# Patient Record
Sex: Female | Born: 1960
Health system: Southern US, Community
[De-identification: ages and names within clinical notes are randomized; demographics above are authoritative.]

## PROBLEM LIST (undated history)

## (undated) DIAGNOSIS — F329 Major depressive disorder, single episode, unspecified: Secondary | ICD-10-CM

## (undated) DIAGNOSIS — K648 Other hemorrhoids: Secondary | ICD-10-CM

## (undated) DIAGNOSIS — I700 Atherosclerosis of aorta: Secondary | ICD-10-CM

## (undated) DIAGNOSIS — K5790 Diverticulosis of intestine, part unspecified, without perforation or abscess without bleeding: Secondary | ICD-10-CM

## (undated) DIAGNOSIS — M1990 Unspecified osteoarthritis, unspecified site: Secondary | ICD-10-CM

## (undated) DIAGNOSIS — N200 Calculus of kidney: Secondary | ICD-10-CM

## (undated) DIAGNOSIS — E0500 Thyrotoxicosis with diffuse goiter without thyrotoxic crisis or storm: Secondary | ICD-10-CM

## (undated) DIAGNOSIS — K760 Fatty (change of) liver, not elsewhere classified: Secondary | ICD-10-CM

## (undated) DIAGNOSIS — Z8669 Personal history of other diseases of the nervous system and sense organs: Secondary | ICD-10-CM

## (undated) DIAGNOSIS — E119 Type 2 diabetes mellitus without complications: Secondary | ICD-10-CM

## (undated) DIAGNOSIS — D126 Benign neoplasm of colon, unspecified: Secondary | ICD-10-CM

## (undated) DIAGNOSIS — F419 Anxiety disorder, unspecified: Secondary | ICD-10-CM

## (undated) DIAGNOSIS — H9190 Unspecified hearing loss, unspecified ear: Secondary | ICD-10-CM

## (undated) DIAGNOSIS — M8580 Other specified disorders of bone density and structure, unspecified site: Secondary | ICD-10-CM

## (undated) DIAGNOSIS — D649 Anemia, unspecified: Secondary | ICD-10-CM

## (undated) DIAGNOSIS — E781 Pure hyperglyceridemia: Secondary | ICD-10-CM

## (undated) DIAGNOSIS — K219 Gastro-esophageal reflux disease without esophagitis: Secondary | ICD-10-CM

## (undated) DIAGNOSIS — M810 Age-related osteoporosis without current pathological fracture: Secondary | ICD-10-CM

## (undated) DIAGNOSIS — E785 Hyperlipidemia, unspecified: Secondary | ICD-10-CM

## (undated) DIAGNOSIS — Z8619 Personal history of other infectious and parasitic diseases: Secondary | ICD-10-CM

## (undated) DIAGNOSIS — T7840XA Allergy, unspecified, initial encounter: Secondary | ICD-10-CM

## (undated) DIAGNOSIS — F32A Depression, unspecified: Secondary | ICD-10-CM

## (undated) HISTORY — DX: Benign neoplasm of colon, unspecified: D12.6

## (undated) HISTORY — PX: NASAL SINUS SURGERY: SHX719

## (undated) HISTORY — DX: Diverticulosis of intestine, part unspecified, without perforation or abscess without bleeding: K57.90

## (undated) HISTORY — DX: Unspecified osteoarthritis, unspecified site: M19.90

## (undated) HISTORY — DX: Major depressive disorder, single episode, unspecified: F32.9

## (undated) HISTORY — DX: Unspecified hearing loss, unspecified ear: H91.90

## (undated) HISTORY — DX: Anxiety disorder, unspecified: F41.9

## (undated) HISTORY — DX: Allergy, unspecified, initial encounter: T78.40XA

## (undated) HISTORY — DX: Age-related osteoporosis without current pathological fracture: M81.0

## (undated) HISTORY — DX: Personal history of other infectious and parasitic diseases: Z86.19

## (undated) HISTORY — DX: Personal history of other diseases of the nervous system and sense organs: Z86.69

## (undated) HISTORY — DX: Depression, unspecified: F32.A

## (undated) HISTORY — DX: Hyperlipidemia, unspecified: E78.5

## (undated) HISTORY — PX: COLONOSCOPY: SHX174

## (undated) HISTORY — DX: Atherosclerosis of aorta: I70.0

## (undated) HISTORY — DX: Pure hyperglyceridemia: E78.1

## (undated) HISTORY — DX: Anemia, unspecified: D64.9

## (undated) HISTORY — DX: Fatty (change of) liver, not elsewhere classified: K76.0

## (undated) HISTORY — DX: Other hemorrhoids: K64.8

## (undated) HISTORY — DX: Type 2 diabetes mellitus without complications: E11.9

## (undated) HISTORY — DX: Thyrotoxicosis with diffuse goiter without thyrotoxic crisis or storm: E05.00

## (undated) HISTORY — DX: Other specified disorders of bone density and structure, unspecified site: M85.80

## (undated) HISTORY — PX: POLYPECTOMY: SHX149

## (undated) HISTORY — PX: OTHER SURGICAL HISTORY: SHX169

## (undated) HISTORY — DX: Calculus of kidney: N20.0

## (undated) HISTORY — DX: Gastro-esophageal reflux disease without esophagitis: K21.9

---

## 1995-01-16 DIAGNOSIS — E0500 Thyrotoxicosis with diffuse goiter without thyrotoxic crisis or storm: Secondary | ICD-10-CM

## 1995-01-16 HISTORY — PX: THYROIDECTOMY: SHX17

## 1995-01-16 HISTORY — DX: Thyrotoxicosis with diffuse goiter without thyrotoxic crisis or storm: E05.00

## 1999-01-16 HISTORY — PX: APPENDECTOMY: SHX54

## 1999-01-16 HISTORY — PX: OOPHORECTOMY: SHX86

## 2001-01-15 HISTORY — PX: ENDOMETRIAL ABLATION: SHX621

## 2007-01-16 HISTORY — PX: HIP FRACTURE SURGERY: SHX118

## 2009-01-15 HISTORY — PX: OTHER SURGICAL HISTORY: SHX169

## 2012-01-16 LAB — HM PAP SMEAR: HM Pap smear: NORMAL

## 2012-12-01 ENCOUNTER — Encounter: Payer: Self-pay | Admitting: Family

## 2012-12-01 ENCOUNTER — Ambulatory Visit (INDEPENDENT_AMBULATORY_CARE_PROVIDER_SITE_OTHER): Payer: BC Managed Care – PPO | Admitting: Family

## 2012-12-01 VITALS — BP 128/90 | HR 68 | Temp 98.2°F | Resp 16 | Ht 64.0 in | Wt 140.0 lb

## 2012-12-01 DIAGNOSIS — Z23 Encounter for immunization: Secondary | ICD-10-CM

## 2012-12-01 DIAGNOSIS — F419 Anxiety disorder, unspecified: Secondary | ICD-10-CM | POA: Insufficient documentation

## 2012-12-01 DIAGNOSIS — F341 Dysthymic disorder: Secondary | ICD-10-CM

## 2012-12-01 DIAGNOSIS — R739 Hyperglycemia, unspecified: Secondary | ICD-10-CM

## 2012-12-01 DIAGNOSIS — E039 Hypothyroidism, unspecified: Secondary | ICD-10-CM

## 2012-12-01 DIAGNOSIS — N200 Calculus of kidney: Secondary | ICD-10-CM

## 2012-12-01 DIAGNOSIS — F329 Major depressive disorder, single episode, unspecified: Secondary | ICD-10-CM

## 2012-12-01 LAB — BASIC METABOLIC PANEL
BUN: 15 mg/dL (ref 6–23)
CO2: 27 mEq/L (ref 19–32)
Chloride: 103 mEq/L (ref 96–112)
Creat: 0.76 mg/dL (ref 0.50–1.10)
Glucose, Bld: 114 mg/dL — ABNORMAL HIGH (ref 70–99)
Potassium: 5.1 mEq/L (ref 3.5–5.3)
Sodium: 141 mEq/L (ref 135–145)

## 2012-12-01 NOTE — Progress Notes (Signed)
Subjective:    Patient ID: Donna Anderson, female    DOB: [birth year], 52 y.o.   MRN: [id number]  HPI  Ms. Danese is a 52 yr old female who presents today to establish care.  Nephrolithiasis-  Passed kidney stone this passed weekend. Reports that she has kidney stones about once a year starting around age 15.  Reports that she had severe pain, passed stone (2-3 mm) bring stone to apt.  Denies dysuria, frequency at this time.    Anxiety-  Reports that it has only been for the past few years. Had first anxiety attack 3 years ago.  Reports that she had one panic attack here.  Reports that she uses lorazepam about once a month.  She reports feeling kind of depressed since moving here from [place].  Still looking for work. Has worked in Set designer as a Engineer, production.  Moved for her husband's work.  Has used zoloft and paxil in the past for depression and reports that she had worsening depression.  Denies SI/HI.    Hypothyroid- Reports that she had graves disease s/p thyroidectomy in 1997.  Has been on levothyroxine ever since.      Review of Systems  Constitutional: Negative for unexpected weight change.  HENT:       Reports hearing aids  Eyes: Negative for visual disturbance.  Respiratory: Negative for cough and shortness of breath.   Cardiovascular: Negative for chest pain.  Gastrointestinal: Negative for nausea and constipation.  Genitourinary: Negative for dysuria.  Musculoskeletal: Negative for myalgias.       Chronic right hip pain  Skin: Negative for rash.  Neurological:       Reports Ha's 2x a week  Hematological: Negative for adenopathy.  Psychiatric/Behavioral:       See HPI   Past Medical History  Diagnosis Date  . Arthritis   . History of chicken pox   . Depression   . Diverticulitis   . Hyperlipidemia   . Kidney stones   . History of migraine   . Graves disease 1997  . Hearing loss     History   Social History  . Marital Status: Married    Spouse Name: N/A     Number of Children: N/A  . Years of Education: N/A   Occupational History  . Not on file.   Social History Main Topics  . Smoking status: Former Smoker -- 15 years    Quit date: 01/16/1999  . Smokeless tobacco: Never Used  . Alcohol Use: Yes     Comment: occasional  . Drug Use: Not on file  . Sexual Activity: Not on file   Other Topics Concern  . Not on file   Social History Narrative   Married (second marriage) has a 32 yr old son in [place]   Worked in Set designer   Enjoys exercising, hiking, kayaking.    Completed 12th grade   1 dog, 1 cat          Past Surgical History  Procedure Laterality Date  . Appendectomy  2001  . Oophorectomy Right 2001    due to large ovarian cyst  . Hip fracture surgery Right 2009    fell playing with dog  . Labrum repair Right 2011  . Thyroidectomy  1997  . Endometrial ablation  2003    Family History  Problem Relation Age of Onset  . Hyperlipidemia Father   . Hypertension Father   . Heart failure Father 16  . Diabetes Sister   .  Arthritis Paternal Grandmother   . Kidney disease Paternal Grandmother   . Diabetes Paternal Grandmother   . Arthritis Paternal Grandfather     No Known Allergies  No current outpatient prescriptions on file prior to visit.   No current facility-administered medications on file prior to visit.    BP 128/90  Pulse 68  Temp(Src) 98.2 F (36.8 C) (Oral)  Resp 16  Ht 5\' 4"  (1.626 m)  Wt 140 lb 0.6 oz (63.522 kg)  BMI 24.03 kg/m2  SpO2 99%        Objective:   Physical Exam  Constitutional: She is oriented to person, place, and time. She appears well-developed and well-nourished. No distress.  HENT:  Head: Normocephalic and atraumatic.  Cardiovascular: Normal rate and regular rhythm.   No murmur heard. Pulmonary/Chest: Effort normal and breath sounds normal. No respiratory distress. She has no wheezes. She has no rales. She exhibits no tenderness.  Musculoskeletal: She exhibits no  edema.  Lymphadenopathy:    She has no cervical adenopathy.  Neurological: She is alert and oriented to person, place, and time.  Psychiatric: She has a normal mood and affect. Her behavior is normal. Judgment and thought content normal.          Assessment & Plan:

## 2012-12-01 NOTE — Assessment & Plan Note (Signed)
Passed stone which she brings in with her today.  Obtain bmet to assess renal function. Clinically resolved.  Monitor.

## 2012-12-01 NOTE — Patient Instructions (Signed)
Please complete lab work prior to leaving. Schedule a fasting physical some time in the next 3 months.

## 2012-12-01 NOTE — Assessment & Plan Note (Signed)
Obtain TSH. Continue synthroid. Clinically stable.

## 2012-12-01 NOTE — Assessment & Plan Note (Signed)
slightlty deteriorated from situational stress.  She does not wish to pursue additional medication at this time.  Monitor.

## 2012-12-03 ENCOUNTER — Encounter: Payer: Self-pay | Admitting: Family

## 2012-12-03 MED ORDER — LEVOTHYROXINE SODIUM 112 MCG PO TABS: 112.0000 ug | ORAL_TABLET | Freq: Every day | ORAL | Status: DC

## 2012-12-03 NOTE — Addendum Note (Signed)
Addended by: Sandford Craze on: 12/03/2012 02:50 PM   Modules accepted: Orders

## 2012-12-04 ENCOUNTER — Encounter: Payer: Self-pay | Admitting: Family

## 2012-12-04 MED ORDER — CITALOPRAM HYDROBROMIDE 20 MG PO TABS: ORAL_TABLET | ORAL | Status: DC

## 2012-12-04 NOTE — Assessment & Plan Note (Signed)
Glucose mildly elevated. Plan A1C next visit.

## 2012-12-14 ENCOUNTER — Encounter: Payer: Self-pay | Admitting: Family

## 2012-12-15 ENCOUNTER — Telehealth: Payer: Self-pay | Admitting: Family

## 2012-12-15 MED ORDER — ESCITALOPRAM OXALATE 10 MG PO TABS: 10.0000 mg | ORAL_TABLET | Freq: Every day | ORAL | Status: DC

## 2012-12-15 NOTE — Telephone Encounter (Signed)
Reviewed Mychart message. Reports that since she increased the citalopram to the full tab she has had increase in panic attacks.  Has lorazapam at home.  Advised pt to stop citalopram 20, start lexapro 10, use lorazepam PRN panic attack during the day and PRN HS for sleep.  Follow up in 2-3 weeks.  Call if further questions/concerns. Pt verbalizes understanding.

## 2012-12-15 NOTE — Telephone Encounter (Signed)
Received medical records from [facility] ° °P: [phone] °F: [phone] °

## 2012-12-23 ENCOUNTER — Encounter: Payer: Self-pay | Admitting: Family

## 2012-12-23 ENCOUNTER — Telehealth: Payer: Self-pay | Admitting: Family

## 2012-12-23 NOTE — Telephone Encounter (Signed)
Phoned pt. Advised her to try lorazepam HS to see if this helps with sleep. Continue lexapro 10, call if no improvement in sleep with HS lorazepam. Pt verbalizes understanding.

## 2013-01-05 ENCOUNTER — Encounter: Payer: Self-pay | Admitting: Family

## 2013-01-06 NOTE — Telephone Encounter (Signed)
We generally see pts back in 1 month.  OK to send 1 month supply of lexapro and 30 tabs alprazolam but will need OV prior to additional refills.

## 2013-01-06 NOTE — Telephone Encounter (Signed)
Pharmacy line busy x 2.

## 2013-01-07 MED ORDER — ESCITALOPRAM OXALATE 10 MG PO TABS: 10.0000 mg | ORAL_TABLET | Freq: Every day | ORAL | Status: DC

## 2013-01-07 MED ORDER — LORAZEPAM 0.5 MG PO TABS: 0.5000 mg | ORAL_TABLET | Freq: Three times a day (TID) | ORAL | Status: DC | PRN

## 2013-01-07 NOTE — Telephone Encounter (Signed)
Refills called to pharmacy voicemail.

## 2013-01-15 DIAGNOSIS — K5790 Diverticulosis of intestine, part unspecified, without perforation or abscess without bleeding: Secondary | ICD-10-CM

## 2013-01-15 HISTORY — DX: Diverticulosis of intestine, part unspecified, without perforation or abscess without bleeding: K57.90

## 2013-02-04 ENCOUNTER — Encounter: Payer: Self-pay | Admitting: Family

## 2013-02-04 NOTE — Telephone Encounter (Signed)
Please schedule follow up appt. 

## 2013-02-09 ENCOUNTER — Encounter: Payer: Self-pay | Admitting: Family

## 2013-02-09 ENCOUNTER — Ambulatory Visit (INDEPENDENT_AMBULATORY_CARE_PROVIDER_SITE_OTHER): Payer: BC Managed Care – PPO | Admitting: Family

## 2013-02-09 VITALS — BP 110/84 | HR 70 | Temp 98.7°F | Resp 14 | Ht 64.0 in | Wt 138.0 lb

## 2013-02-09 DIAGNOSIS — M25441 Effusion, right hand: Secondary | ICD-10-CM

## 2013-02-09 DIAGNOSIS — M7989 Other specified soft tissue disorders: Secondary | ICD-10-CM

## 2013-02-09 DIAGNOSIS — J309 Allergic rhinitis, unspecified: Secondary | ICD-10-CM

## 2013-02-09 MED ORDER — LEVOTHYROXINE SODIUM 112 MCG PO TABS: 112.0000 ug | ORAL_TABLET | Freq: Every day | ORAL | Status: DC

## 2013-02-09 MED ORDER — FLUTICASONE PROPIONATE 50 MCG/ACT NA SUSP: 2.0000 | Freq: Every day | NASAL | Status: DC

## 2013-02-09 MED ORDER — ESCITALOPRAM OXALATE 10 MG PO TABS: 10.0000 mg | ORAL_TABLET | Freq: Every day | ORAL | Status: DC

## 2013-02-09 NOTE — Assessment & Plan Note (Signed)
Very mild- likely OA.  Monitor for now.  If symptoms worsen, could consider autoimmune work up.

## 2013-02-09 NOTE — Assessment & Plan Note (Signed)
Trial of flonase. Pt advised to call if symptoms worsen or if symptoms do not improve.

## 2013-02-09 NOTE — Progress Notes (Signed)
Pre visit review using our clinic review tool, if applicable. No additional management support is needed unless otherwise documented below in the visit note. 

## 2013-02-09 NOTE — Progress Notes (Signed)
Subjective:    Patient ID: Donna Anderson, female    DOB: [birth year], 53 y.o.   MRN: [id number]  Hand Pain  Pertinent negatives include no chest pain.   Ms. [name] is a 53 year old female who presents today for follow up.  Depression/Anxiety) Patient reports she's feeling much better. Patient reports she's starting a new job next week and would like to start weaning off of the Lexapro. Patient reports Ativan has helped with the night time panic attacks and takes every night at bedtime.    Allergies) Patient reports sinus headaches and nasal congestion to the left frontal side which has prevented patient from sleeping. Patient reports taking Claritin OTC without relief.   Finger pain) Patient reports pain, stiffness,decreased ROM, and swelling to the right index distal interphalangeal joint x 3 weeks. Denies recent injury. Patient also reporting "bony growth" to right lateral wrist x 3 weeks as well, denies pain and inflammation   Review of Systems  HENT: Positive for congestion, rhinorrhea and sinus pressure. Negative for ear pain.   Eyes: Positive for itching.  Respiratory: Negative for cough and shortness of breath.   Cardiovascular: Negative for chest pain.  Gastrointestinal: Negative for nausea and vomiting.  Musculoskeletal: Positive for arthralgias and joint swelling.  Neurological: Positive for headaches.       Reports daily headaches to left frontal area. Believes this is attributed to chronic sinusitis.  Psychiatric/Behavioral: Negative for suicidal ideas and agitation. The patient is not nervous/anxious.    Past Medical History  Diagnosis Date  . Arthritis   . History of chicken pox   . Depression   . Diverticulitis   . Hyperlipidemia   . Kidney stones   . History of migraine   . Graves disease 1997  . Hearing loss     History   Social History  . Marital Status: Married    Spouse Name: N/A    Number of Children: N/A  . Years of Education: N/A    Occupational History  . Not on file.   Social History Main Topics  . Smoking status: Former Smoker -- 15 years    Quit date: 01/16/1999  . Smokeless tobacco: Never Used  . Alcohol Use: Yes     Comment: occasional  . Drug Use: Not on file  . Sexual Activity: Not on file   Other Topics Concern  . Not on file   Social History Narrative   Married (second marriage) has a 36 yr old son in [place]   Worked in Psychologist, educational   Enjoys exercising, hiking, kayaking.    Completed 12th grade   1 dog, 1 cat          Past Surgical History  Procedure Laterality Date  . Appendectomy  2001  . Oophorectomy Right 2001    due to large ovarian cyst  . Hip fracture surgery Right 2009    fell playing with dog  . Labrum repair Right 2011  . Thyroidectomy  1997  . Endometrial ablation  2003    Family History  Problem Relation Age of Onset  . Hyperlipidemia Father   . Hypertension Father   . Heart failure Father 79  . Diabetes Sister   . Arthritis Paternal Grandmother   . Kidney disease Paternal Grandmother   . Diabetes Paternal Grandmother   . Arthritis Paternal Grandfather     No Known Allergies  Current Outpatient Prescriptions on File Prior to Visit  Medication Sig Dispense Refill  . LORazepam (ATIVAN)  0.5 MG tablet Take 1 tablet (0.5 mg total) by mouth 3 (three) times daily as needed for anxiety.  30 tablet  0   No current facility-administered medications on file prior to visit.    BP 110/84  Pulse 70  Temp(Src) 98.7 F (37.1 C) (Oral)  Resp 14  Ht 5\' 4"  (1.626 m)  Wt 138 lb 0.6 oz (62.615 kg)  BMI 23.68 kg/m2  SpO2 98%       Objective:   Physical Exam  Constitutional: She is oriented to person, place, and time. She appears well-nourished.  HENT:  Head: Normocephalic.  Right Ear: External ear normal.  Left Ear: External ear normal.  Mouth/Throat: Oropharynx is clear and moist.  Eyes: Pupils are equal, round, and reactive to light.  Neck: Neck supple.   Cardiovascular: Normal rate and regular rhythm.   Pulmonary/Chest: Breath sounds normal. No respiratory distress. She has no wheezes.  Musculoskeletal:  Very mild swelling of the right DIP  Lymphadenopathy:    She has no cervical adenopathy.  Neurological: She is alert and oriented to person, place, and time.  Skin: Skin is warm and dry.  Linear ridge noted right index finger nail.     Psychiatric: She has a normal mood and affect. Her behavior is normal.          Assessment & Plan:  I have personally seen and examined patient and agree with Alma Friendly NP student's assessment and plan.

## 2013-02-09 NOTE — Patient Instructions (Signed)
Continue Lexapro.  You may taper down and discontinue Lorazepam as tolerated. Follow up in 3 months.

## 2013-02-09 NOTE — Assessment & Plan Note (Signed)
Continue Lexapro. May discontinue Lorazepam for anxiety as tolerated. Follow up in three months.

## 2013-02-10 ENCOUNTER — Other Ambulatory Visit: Payer: Self-pay | Admitting: Family

## 2013-02-10 ENCOUNTER — Encounter: Payer: Self-pay | Admitting: Family

## 2013-02-10 NOTE — Telephone Encounter (Signed)
Last lorazepam refill 01/07/13, #30.  Please advise.

## 2013-02-10 NOTE — Telephone Encounter (Signed)
OK to send 30 tabs zero refills.  

## 2013-02-11 ENCOUNTER — Other Ambulatory Visit: Payer: Self-pay | Admitting: Family

## 2013-02-11 NOTE — Telephone Encounter (Signed)
Rx called to pharmacy voicemail as below. Sent mychart notification to pt.

## 2013-02-11 NOTE — Telephone Encounter (Signed)
See 02/10/13 mychart message. Rx complete.

## 2013-03-03 ENCOUNTER — Encounter: Payer: Self-pay | Admitting: Family

## 2013-03-04 NOTE — Telephone Encounter (Signed)
Can you do this or would you like to forward to [facility]?

## 2013-03-11 NOTE — Telephone Encounter (Signed)
Please advise 

## 2013-03-11 NOTE — Telephone Encounter (Signed)
OK to send 30 tabs of lorazepam please.

## 2013-03-13 MED ORDER — LORAZEPAM 0.5 MG PO TABS: ORAL_TABLET | ORAL | Status: DC

## 2013-03-13 NOTE — Addendum Note (Signed)
Addended by: Kelle Darting A on: 03/13/2013 11:47 AM   Modules accepted: Orders

## 2013-03-30 ENCOUNTER — Encounter: Payer: Self-pay | Admitting: Family

## 2013-03-30 NOTE — Telephone Encounter (Signed)
Please call pt in AM.  If not better she needs OV.

## 2013-03-31 ENCOUNTER — Ambulatory Visit (INDEPENDENT_AMBULATORY_CARE_PROVIDER_SITE_OTHER): Payer: BC Managed Care – PPO | Admitting: Family

## 2013-03-31 ENCOUNTER — Ambulatory Visit (HOSPITAL_BASED_OUTPATIENT_CLINIC_OR_DEPARTMENT_OTHER)
Admission: RE | Admit: 2013-03-31 | Discharge: 2013-03-31 | Disposition: A | Discharge status: Home / Self Care | Payer: BC Managed Care – PPO | Source: Ambulatory Visit | Attending: Family | Admitting: Family

## 2013-03-31 ENCOUNTER — Encounter: Payer: Self-pay | Admitting: Family

## 2013-03-31 ENCOUNTER — Other Ambulatory Visit: Payer: Self-pay | Admitting: Family

## 2013-03-31 ENCOUNTER — Encounter (HOSPITAL_BASED_OUTPATIENT_CLINIC_OR_DEPARTMENT_OTHER): Payer: Self-pay

## 2013-03-31 ENCOUNTER — Telehealth: Payer: Self-pay | Admitting: *Deleted

## 2013-03-31 VITALS — BP 90/68 | HR 68 | Temp 98.7°F | Ht 64.0 in | Wt 134.1 lb

## 2013-03-31 DIAGNOSIS — N949 Unspecified condition associated with female genital organs and menstrual cycle: Secondary | ICD-10-CM

## 2013-03-31 DIAGNOSIS — N9489 Other specified conditions associated with female genital organs and menstrual cycle: Secondary | ICD-10-CM

## 2013-03-31 DIAGNOSIS — R1110 Vomiting, unspecified: Secondary | ICD-10-CM | POA: Insufficient documentation

## 2013-03-31 DIAGNOSIS — F419 Anxiety disorder, unspecified: Secondary | ICD-10-CM

## 2013-03-31 DIAGNOSIS — F341 Dysthymic disorder: Secondary | ICD-10-CM

## 2013-03-31 DIAGNOSIS — F329 Major depressive disorder, single episode, unspecified: Secondary | ICD-10-CM

## 2013-03-31 DIAGNOSIS — Z9089 Acquired absence of other organs: Secondary | ICD-10-CM | POA: Insufficient documentation

## 2013-03-31 DIAGNOSIS — R109 Unspecified abdominal pain: Secondary | ICD-10-CM

## 2013-03-31 DIAGNOSIS — R112 Nausea with vomiting, unspecified: Secondary | ICD-10-CM

## 2013-03-31 DIAGNOSIS — I880 Nonspecific mesenteric lymphadenitis: Secondary | ICD-10-CM | POA: Insufficient documentation

## 2013-03-31 MED ORDER — METRONIDAZOLE 500 MG PO TABS: 500.0000 mg | ORAL_TABLET | Freq: Two times a day (BID) | ORAL | Status: DC

## 2013-03-31 MED ORDER — ONDANSETRON HCL 4 MG PO TABS: 4.0000 mg | ORAL_TABLET | Freq: Three times a day (TID) | ORAL | Status: DC | PRN

## 2013-03-31 MED ORDER — IOHEXOL 300 MG/ML  SOLN
100.0000 mL | Freq: Once | INTRAMUSCULAR | Status: AC | PRN
  Administered 2013-03-31: 100 mL via INTRAVENOUS

## 2013-03-31 MED ORDER — ONDANSETRON HCL 4 MG/2ML IJ SOLN
4.0000 mg | Freq: Once | INTRAMUSCULAR | Status: AC
  Administered 2013-03-31: 4 mg via INTRAMUSCULAR

## 2013-03-31 MED ORDER — CIPROFLOXACIN HCL 500 MG PO TABS: 500.0000 mg | ORAL_TABLET | Freq: Two times a day (BID) | ORAL | Status: DC

## 2013-03-31 NOTE — Telephone Encounter (Signed)
Pt returned may call.  Reviewed below recommendations. She reports that the zofran injection has helped her nausea.

## 2013-03-31 NOTE — Telephone Encounter (Signed)
Left message requesting that pt return our call.   Reviewed CT scan.  Notes some mildly enlarged lymph nodes. Could be related to abdominal infection. No clear diverticulitis.  I have pended rx for cipro/flagyl for her to start just to be safe. (Please verify which pharmacy she prefers.) Cyst is noted on left ovary- radiology recommends pelvic ultrasound to further evalutate. She can do this when she if feeling better. Order pended below.

## 2013-03-31 NOTE — Patient Instructions (Addendum)
Please complete CT scan on the first floor. You may use zofran as needed for nausea. Drinks frequent small sips of water, gingerale, popsicles. Go to the ER if you are unable to keep down liquids despite use of zofran.  Go to ER if severe/worsening abdominal pain We will contact you with your results this afternoon.

## 2013-03-31 NOTE — Progress Notes (Signed)
Pre visit review using our clinic review tool, if applicable. No additional management support is needed unless otherwise documented below in the visit note. 

## 2013-03-31 NOTE — Progress Notes (Signed)
Subjective:    Patient ID: Donna Anderson, female    DOB: August 02, 1960, 53 y.o.   MRN: [id number]  HPI  Donna Anderson is a 53 yr old female who presents today with chief complaint of abdominal cramping. Reports that Saturday night she had hard dark stool. Husband gave her prune juice, then developed diarrhea.  Developed vomiting on Sunday.  Last food was Saturday night.  Can't keep down even water. Has not kept any fluids since last night which came up.    Reports cramping pain is 8-10/10.  Heating pad seems to help.   Anxiety/depression-stable on lexapro and HS lorazepam. Tried to wean down on lexapro but had worsening anxiety. Resumed lexapro and reports symptoms are well controlled.   Review of Systems See HPI  Past Medical History  Diagnosis Date  . Arthritis   . History of chicken pox   . Depression   . Diverticulitis   . Hyperlipidemia   . Kidney stones   . History of migraine   . Graves disease 1997  . Hearing loss     History   Social History  . Marital Status: Married    Spouse Name: N/A    Number of Children: N/A  . Years of Education: N/A   Occupational History  . Not on file.   Social History Main Topics  . Smoking status: Former Smoker -- 15 years    Quit date: 01/16/1999  . Smokeless tobacco: Never Used  . Alcohol Use: Yes     Comment: occasional  . Drug Use: Not on file  . Sexual Activity: Not on file   Other Topics Concern  . Not on file   Social History Narrative   Married (second marriage) has a 67 yr old son in [place]   Worked in Psychologist, educational   Enjoys exercising, hiking, kayaking.    Completed 12th grade   1 dog, 1 cat          Past Surgical History  Procedure Laterality Date  . Appendectomy  2001  . Oophorectomy Right 2001    due to large ovarian cyst  . Hip fracture surgery Right 2009    fell playing with dog  . Labrum repair Right 2011  . Thyroidectomy  1997  . Endometrial ablation  2003    Family History  Problem Relation Age  of Onset  . Hyperlipidemia Father   . Hypertension Father   . Heart failure Father 43  . Diabetes Sister   . Arthritis Paternal Grandmother   . Kidney disease Paternal Grandmother   . Diabetes Paternal Grandmother   . Arthritis Paternal Grandfather     No Known Allergies  Current Outpatient Prescriptions on File Prior to Visit  Medication Sig Dispense Refill  . escitalopram (LEXAPRO) 10 MG tablet TAKE ONE TABLET BY MOUTH ONCE DAILY  30 tablet  4  . fluticasone (FLONASE) 50 MCG/ACT nasal spray Place 2 sprays into both nostrils daily.  16 g  2  . levothyroxine (SYNTHROID, LEVOTHROID) 112 MCG tablet Take 1 tablet (112 mcg total) by mouth daily before breakfast.  30 tablet  5  . LORazepam (ATIVAN) 0.5 MG tablet TAKE ONE TABLET BY MOUTH THREE TIMES DAILY AS NEEDED FOR ANXIETY  30 tablet  0   No current facility-administered medications on file prior to visit.    BP 90/68  Pulse 68  Temp(Src) 98.7 F (37.1 C) (Oral)  Ht 5\' 4"  (1.626 m)  Wt 134 lb 1.9 oz (60.836 kg)  BMI 23.01 kg/m2  SpO2 98%       Objective:   Physical Exam  Constitutional: She appears well-developed and well-nourished. No distress.  Cardiovascular: Normal rate and regular rhythm.   No murmur heard. Pulmonary/Chest: Effort normal and breath sounds normal. No respiratory distress. She has no wheezes. She has no rales. She exhibits no tenderness.  Abdominal: Soft. Bowel sounds are decreased.  Generalized abdominal tenderness without rebound or guarding.          Assessment & Plan:

## 2013-03-31 NOTE — Assessment & Plan Note (Signed)
Stable on current dose of lexapro and prn lorazepam. Continue same.

## 2013-03-31 NOTE — Telephone Encounter (Signed)
Rx cancelled with [facility] at [facility].

## 2013-03-31 NOTE — Assessment & Plan Note (Signed)
CT abdomen and pelvis was performed and notes borderline abdominal LAD. Will rx empirically with cipro/flagyl, oral zofran as needed. Advised pt on frequent sips of fluids.  Follow up in 1 week, sooner if symptoms do not improve.

## 2013-03-31 NOTE — Telephone Encounter (Signed)
Spoke with pt. States she feels lousy. Scheduled appt for today at 9:45am.

## 2013-03-31 NOTE — Assessment & Plan Note (Signed)
Incidental finding of adnexal cyst on ultrasound. Will refer for pelvic ultrasound for further evaluation.

## 2013-03-31 NOTE — Telephone Encounter (Signed)
Message copied by Ronny Flurry on Tue Mar 31, 2013  3:29 PM ------      Message from: O'SULLIVAN, MELISSA      Created: Tue Mar 31, 2013  1:36 PM       Could you pls cancel zofran at [facility]? thanks ------

## 2013-04-05 ENCOUNTER — Ambulatory Visit (HOSPITAL_BASED_OUTPATIENT_CLINIC_OR_DEPARTMENT_OTHER)
Admission: RE | Admit: 2013-04-05 | Discharge: 2013-04-05 | Disposition: A | Discharge status: Home / Self Care | Payer: BC Managed Care – PPO | Source: Ambulatory Visit | Attending: Family | Admitting: Family

## 2013-04-05 ENCOUNTER — Encounter: Payer: Self-pay | Admitting: Family

## 2013-04-05 ENCOUNTER — Ambulatory Visit (HOSPITAL_BASED_OUTPATIENT_CLINIC_OR_DEPARTMENT_OTHER): Admission: RE | Admit: 2013-04-05 | Payer: BC Managed Care – PPO | Source: Ambulatory Visit

## 2013-04-05 DIAGNOSIS — N949 Unspecified condition associated with female genital organs and menstrual cycle: Secondary | ICD-10-CM

## 2013-04-05 DIAGNOSIS — L723 Sebaceous cyst: Secondary | ICD-10-CM | POA: Insufficient documentation

## 2013-04-06 ENCOUNTER — Encounter: Payer: Self-pay | Admitting: Family

## 2013-04-06 ENCOUNTER — Ambulatory Visit (INDEPENDENT_AMBULATORY_CARE_PROVIDER_SITE_OTHER): Payer: BC Managed Care – PPO | Admitting: Family

## 2013-04-06 ENCOUNTER — Telehealth: Payer: Self-pay | Admitting: Family

## 2013-04-06 VITALS — BP 100/78 | HR 60 | Temp 98.0°F | Resp 16 | Ht 64.0 in | Wt 136.0 lb

## 2013-04-06 DIAGNOSIS — R197 Diarrhea, unspecified: Secondary | ICD-10-CM

## 2013-04-06 NOTE — Telephone Encounter (Signed)
Spoke with pt and confirmed appt for today at 5:45pm. Stool collection kit has been obtained and will be given to pt at appt.

## 2013-04-06 NOTE — Telephone Encounter (Signed)
Please bring her in today for evaluation. I would like her to complete stool studies-pended below.  Can we obtain cups from lab for stool sample before katrina leaves this evening?

## 2013-04-06 NOTE — Progress Notes (Signed)
Pre visit review using our clinic review tool, if applicable. No additional management support is needed unless otherwise documented below in the visit note. 

## 2013-04-06 NOTE — Patient Instructions (Signed)
Please continue antibiotics. Complete stool studies and return at your earliest convenience.  Go to ER if worsening/severe abdominal pain. You will be contacted about your referral to GI.

## 2013-04-06 NOTE — Progress Notes (Signed)
Subjective:    Patient ID: Donna Anderson, female    DOB: [birth year], 53 y.o.   MRN: [id number]  HPI  Donna Anderson is a 53 yr old female who presents today with chief complaint of diarrhea. Diarrhea has been present x 5 days and is improved slightly with immodium. Denies fever or blood in the stool.  Had CT abd/pelvis on 3/17 which was negative for acute abdominal pathology but ?'d possibility of mesenteric adenitis. She was started on cipro/flagyl.  Note was made of ?ovarian cyst.  Further evaluation with [place] noted benign physiologic cysts.    Review of Systems See HPI  Past Medical History  Diagnosis Date  . Arthritis   . History of chicken pox   . Depression   . Diverticulitis   . Hyperlipidemia   . Kidney stones   . History of migraine   . Graves disease 1997  . Hearing loss     History   Social History  . Marital Status: Married    Spouse Name: N/A    Number of Children: N/A  . Years of Education: N/A   Occupational History  . Not on file.   Social History Main Topics  . Smoking status: Former Smoker -- 15 years    Quit date: 01/16/1999  . Smokeless tobacco: Never Used  . Alcohol Use: Yes     Comment: occasional  . Drug Use: No  . Sexual Activity: Yes    Birth Control/ Protection: None   Other Topics Concern  . Not on file   Social History Narrative   Married (second marriage) has a 10 yr old son in [place]   Worked in Psychologist, educational   Enjoys exercising, hiking, kayaking.    Completed 12th grade   1 dog, 1 cat          Past Surgical History  Procedure Laterality Date  . Appendectomy  2001  . Oophorectomy Right 2001    due to large ovarian cyst  . Hip fracture surgery Right 2009    fell playing with dog  . Labrum repair Right 2011  . Thyroidectomy  1997  . Endometrial ablation  2003    Family History  Problem Relation Age of Onset  . Hyperlipidemia Father   . Hypertension Father   . Heart failure Father 13  . Diabetes Sister   . Arthritis  Paternal Grandmother   . Kidney disease Paternal Grandmother   . Diabetes Paternal Grandmother   . Arthritis Paternal Grandfather     No Known Allergies  Current Outpatient Prescriptions on File Prior to Visit  Medication Sig Dispense Refill  . ciprofloxacin (CIPRO) 500 MG tablet Take 1 tablet (500 mg total) by mouth 2 (two) times daily.  14 tablet  0  . escitalopram (LEXAPRO) 10 MG tablet TAKE ONE TABLET BY MOUTH ONCE DAILY  30 tablet  4  . fluticasone (FLONASE) 50 MCG/ACT nasal spray Place 2 sprays into both nostrils daily.  16 g  2  . levothyroxine (SYNTHROID, LEVOTHROID) 112 MCG tablet Take 1 tablet (112 mcg total) by mouth daily before breakfast.  30 tablet  5  . LORazepam (ATIVAN) 0.5 MG tablet TAKE ONE TABLET BY MOUTH THREE TIMES DAILY AS NEEDED FOR ANXIETY  30 tablet  0  . metroNIDAZOLE (FLAGYL) 500 MG tablet Take 1 tablet (500 mg total) by mouth 2 (two) times daily.  14 tablet  0  . ondansetron (ZOFRAN) 4 MG tablet Take 1 tablet (4 mg total) by mouth  every 8 (eight) hours as needed for nausea or vomiting.  20 tablet  0   No current facility-administered medications on file prior to visit.    BP 100/78  Pulse 60  Temp(Src) 98 F (36.7 C) (Oral)  Resp 16  Ht 5\' 4"  (1.626 m)  Wt 136 lb 0.6 oz (61.707 kg)  BMI 23.34 kg/m2  SpO2 99%       Objective:   Physical Exam  Constitutional: She is oriented to person, place, and time. She appears well-developed and well-nourished.  HENT:  Head: Normocephalic and atraumatic.  Cardiovascular: Normal rate and regular rhythm.   No murmur heard. Pulmonary/Chest: Effort normal and breath sounds normal. No respiratory distress. She has no wheezes. She has no rales. She exhibits no tenderness.  Abdominal: Soft. Bowel sounds are normal. She exhibits no distension.  Mild generalized tenderness without guarding or rebound tenderness.  Musculoskeletal: She exhibits no edema.  Neurological: She is alert and oriented to person, place, and  time.  Psychiatric: She has a normal mood and affect. Her behavior is normal. Judgment and thought content normal.          Assessment & Plan:

## 2013-04-06 NOTE — Telephone Encounter (Signed)
Scheduled pt for today at 5:45pm. Left detailed message on pt's cell# that appt has been scheduled and to call if any questions or problems. Will check with lab when they return from lunch re: stool collection kit.

## 2013-04-08 ENCOUNTER — Encounter (HOSPITAL_BASED_OUTPATIENT_CLINIC_OR_DEPARTMENT_OTHER): Payer: Self-pay | Admitting: Emergency Medicine

## 2013-04-08 ENCOUNTER — Emergency Department (HOSPITAL_BASED_OUTPATIENT_CLINIC_OR_DEPARTMENT_OTHER)
Admission: EM | Admit: 2013-04-08 | Discharge: 2013-04-08 | Disposition: A | Discharge status: Home / Self Care | Payer: BC Managed Care – PPO | Attending: Emergency Medicine | Admitting: Emergency Medicine

## 2013-04-08 ENCOUNTER — Emergency Department (HOSPITAL_BASED_OUTPATIENT_CLINIC_OR_DEPARTMENT_OTHER): Payer: BC Managed Care – PPO

## 2013-04-08 DIAGNOSIS — Z8679 Personal history of other diseases of the circulatory system: Secondary | ICD-10-CM | POA: Insufficient documentation

## 2013-04-08 DIAGNOSIS — Z8719 Personal history of other diseases of the digestive system: Secondary | ICD-10-CM | POA: Insufficient documentation

## 2013-04-08 DIAGNOSIS — F3289 Other specified depressive episodes: Secondary | ICD-10-CM | POA: Insufficient documentation

## 2013-04-08 DIAGNOSIS — Z79899 Other long term (current) drug therapy: Secondary | ICD-10-CM | POA: Insufficient documentation

## 2013-04-08 DIAGNOSIS — Z87891 Personal history of nicotine dependence: Secondary | ICD-10-CM | POA: Insufficient documentation

## 2013-04-08 DIAGNOSIS — Z862 Personal history of diseases of the blood and blood-forming organs and certain disorders involving the immune mechanism: Secondary | ICD-10-CM | POA: Insufficient documentation

## 2013-04-08 DIAGNOSIS — Z87442 Personal history of urinary calculi: Secondary | ICD-10-CM | POA: Insufficient documentation

## 2013-04-08 DIAGNOSIS — Z8669 Personal history of other diseases of the nervous system and sense organs: Secondary | ICD-10-CM | POA: Insufficient documentation

## 2013-04-08 DIAGNOSIS — Z8739 Personal history of other diseases of the musculoskeletal system and connective tissue: Secondary | ICD-10-CM | POA: Insufficient documentation

## 2013-04-08 DIAGNOSIS — Z792 Long term (current) use of antibiotics: Secondary | ICD-10-CM | POA: Insufficient documentation

## 2013-04-08 DIAGNOSIS — R1084 Generalized abdominal pain: Secondary | ICD-10-CM | POA: Insufficient documentation

## 2013-04-08 DIAGNOSIS — IMO0002 Reserved for concepts with insufficient information to code with codable children: Secondary | ICD-10-CM | POA: Insufficient documentation

## 2013-04-08 DIAGNOSIS — F329 Major depressive disorder, single episode, unspecified: Secondary | ICD-10-CM | POA: Insufficient documentation

## 2013-04-08 DIAGNOSIS — Z8639 Personal history of other endocrine, nutritional and metabolic disease: Secondary | ICD-10-CM | POA: Insufficient documentation

## 2013-04-08 DIAGNOSIS — Z8619 Personal history of other infectious and parasitic diseases: Secondary | ICD-10-CM | POA: Insufficient documentation

## 2013-04-08 DIAGNOSIS — R109 Unspecified abdominal pain: Secondary | ICD-10-CM

## 2013-04-08 DIAGNOSIS — R197 Diarrhea, unspecified: Secondary | ICD-10-CM

## 2013-04-08 LAB — CBC WITH DIFFERENTIAL/PLATELET
BASOS ABS: 0 10*3/uL (ref 0.0–0.1)
Basophils Relative: 0 % (ref 0–1)
EOS PCT: 1 % (ref 0–5)
Eosinophils Absolute: 0.1 10*3/uL (ref 0.0–0.7)
HEMATOCRIT: 40.7 % (ref 36.0–46.0)
Hemoglobin: 13.9 g/dL (ref 12.0–15.0)
LYMPHS ABS: 2.5 10*3/uL (ref 0.7–4.0)
LYMPHS PCT: 25 % (ref 12–46)
MCH: 31.4 pg (ref 26.0–34.0)
MCHC: 34.2 g/dL (ref 30.0–36.0)
MCV: 91.9 fL (ref 78.0–100.0)
Monocytes Absolute: 0.7 10*3/uL (ref 0.1–1.0)
Monocytes Relative: 7 % (ref 3–12)
NEUTROS ABS: 6.6 10*3/uL (ref 1.7–7.7)
Neutrophils Relative %: 67 % (ref 43–77)
PLATELETS: 275 10*3/uL (ref 150–400)
RBC: 4.43 MIL/uL (ref 3.87–5.11)
RDW: 12.5 % (ref 11.5–15.5)
WBC: 9.9 10*3/uL (ref 4.0–10.5)

## 2013-04-08 LAB — COMPREHENSIVE METABOLIC PANEL
ALK PHOS: 68 U/L (ref 39–117)
ALT: 21 U/L (ref 0–35)
AST: 21 U/L (ref 0–37)
Albumin: 4 g/dL (ref 3.5–5.2)
BILIRUBIN TOTAL: 0.2 mg/dL — AB (ref 0.3–1.2)
BUN: 14 mg/dL (ref 6–23)
CHLORIDE: 100 meq/L (ref 96–112)
CO2: 27 mEq/L (ref 19–32)
Calcium: 8.7 mg/dL (ref 8.4–10.5)
Creatinine, Ser: 0.7 mg/dL (ref 0.50–1.10)
Glucose, Bld: 98 mg/dL (ref 70–99)
Potassium: 4.1 mEq/L (ref 3.7–5.3)
SODIUM: 141 meq/L (ref 137–147)
Total Protein: 7.1 g/dL (ref 6.0–8.3)

## 2013-04-08 LAB — URINALYSIS, ROUTINE W REFLEX MICROSCOPIC
Bilirubin Urine: NEGATIVE
GLUCOSE, UA: NEGATIVE mg/dL
KETONES UR: 15 mg/dL — AB
LEUKOCYTES UA: NEGATIVE
Nitrite: NEGATIVE
Protein, ur: NEGATIVE mg/dL
Specific Gravity, Urine: 1.019 (ref 1.005–1.030)
UROBILINOGEN UA: 0.2 mg/dL (ref 0.0–1.0)
pH: 6 (ref 5.0–8.0)

## 2013-04-08 LAB — OVA AND PARASITE EXAMINATION: OP: NONE SEEN

## 2013-04-08 LAB — URINE MICROSCOPIC-ADD ON

## 2013-04-08 LAB — LIPASE, BLOOD: Lipase: 22 U/L (ref 11–59)

## 2013-04-08 MED ORDER — METRONIDAZOLE 500 MG PO TABS: 500.0000 mg | ORAL_TABLET | Freq: Two times a day (BID) | ORAL | Status: DC

## 2013-04-08 MED ORDER — IOHEXOL 300 MG/ML  SOLN
100.0000 mL | Freq: Once | INTRAMUSCULAR | Status: AC | PRN
  Administered 2013-04-08: 100 mL via INTRAVENOUS

## 2013-04-08 MED ORDER — HYDROMORPHONE HCL PF 1 MG/ML IJ SOLN
1.0000 mg | Freq: Once | INTRAMUSCULAR | Status: AC
  Administered 2013-04-08: 1 mg via INTRAVENOUS
  Filled 2013-04-08: qty 1

## 2013-04-08 MED ORDER — IOHEXOL 300 MG/ML  SOLN
50.0000 mL | Freq: Once | INTRAMUSCULAR | Status: AC | PRN
  Administered 2013-04-08: 50 mL via ORAL

## 2013-04-08 MED ORDER — OXYCODONE-ACETAMINOPHEN 5-325 MG PO TABS: 1.0000 | ORAL_TABLET | ORAL | Status: DC | PRN

## 2013-04-08 MED ORDER — ONDANSETRON HCL 4 MG/2ML IJ SOLN
4.0000 mg | Freq: Once | INTRAMUSCULAR | Status: AC
  Administered 2013-04-08: 4 mg via INTRAVENOUS
  Filled 2013-04-08: qty 2

## 2013-04-08 MED ORDER — IOHEXOL 300 MG/ML  SOLN: 50.0000 mL | Freq: Once | INTRAMUSCULAR | Status: DC | PRN

## 2013-04-08 NOTE — Discharge Instructions (Signed)
Abdominal Pain, Adult °Many things can cause abdominal pain. Usually, abdominal pain is not caused by a disease and will improve without treatment. It can often be observed and treated at home. Your health care provider will do a physical exam and possibly order blood tests and X-rays to help determine the seriousness of your pain. However, in many cases, more time must pass before a clear cause of the pain can be found. Before that point, your health care provider may not know if you need more testing or further treatment. °HOME CARE INSTRUCTIONS  °Monitor your abdominal pain for any changes. The following actions may help to alleviate any discomfort you are experiencing: °· Only take over-the-counter or prescription medicines as directed by your health care provider. °· Do not take laxatives unless directed to do so by your health care provider. °· Try a clear liquid diet (broth, tea, or water) as directed by your health care provider. Slowly move to a bland diet as tolerated. °SEEK MEDICAL CARE IF: °· You have unexplained abdominal pain. °· You have abdominal pain associated with nausea or diarrhea. °· You have pain when you urinate or have a bowel movement. °· You experience abdominal pain that wakes you in the night. °· You have abdominal pain that is worsened or improved by eating food. °· You have abdominal pain that is worsened with eating fatty foods. °SEEK IMMEDIATE MEDICAL CARE IF:  °· Your pain does not go away within 2 hours. °· You have a fever. °· You keep throwing up (vomiting). °· Your pain is felt only in portions of the abdomen, such as the right side or the left lower portion of the abdomen. °· You pass bloody or black tarry stools. °MAKE SURE YOU: °· Understand these instructions.   °· Will watch your condition.   °· Will get help right away if you are not doing well or get worse.   °Document Released: 10/11/2004 Document Revised: 10/22/2012 Document Reviewed: 09/10/2012 °[employer]® Patient  Information ©2014 [employer]. ° °

## 2013-04-08 NOTE — ED Notes (Signed)
Diarrhea x6 days with severe low abd pain.  Taking abx for diverticulitis.  Sent here by pmd for possible C. Diff.

## 2013-04-08 NOTE — ED Provider Notes (Signed)
  Medical screening examination/treatment/procedure(s) were performed by non-physician practitioner and as supervising physician I was immediately available for consultation/collaboration.   EKG Interpretation None         Carmin Muskrat, MD 04/08/13 2342

## 2013-04-08 NOTE — ED Notes (Signed)
C/o diarrhea, n/v abd x 2 weeks  Worse yesterday

## 2013-04-08 NOTE — ED Provider Notes (Signed)
CSN: [id number]     Arrival date & time 04/08/13  1831 History   First MD Initiated Contact with Patient 04/08/13 1841     Chief Complaint  Patient presents with  . Abdominal Pain     (Consider location/radiation/quality/duration/timing/severity/associated sxs/prior Treatment) HPI Comments: Pt states that she started with lower abdominal cramping and diarrhea six days ago. Pt states that she had the symptoms and had a ct scan that was negative and her pcp put her on antibiotics and she has been having diarrhea about 30 times a day for the last 5 days. Pt states that she decided to take imodium last night and today and the pain acutely worsened. Pain is in the epigastric region and suprapubic area. Pt states that her doctor thinks that she may have cdiff and she sent the stool but hasn't gotten results. Denies fever. Pt is here because of the worsening pain. Denies bloody stools, vomiting, fever, dysuria or vaginal discharge. Pt has appointment with gi next week.  The history is provided by the patient. No language interpreter was used.    Past Medical History  Diagnosis Date  . Arthritis   . History of chicken pox   . Depression   . Diverticulitis   . Hyperlipidemia   . Kidney stones   . History of migraine   . Graves disease 1997  . Hearing loss    Past Surgical History  Procedure Laterality Date  . Appendectomy  2001  . Oophorectomy Right 2001    due to large ovarian cyst  . Hip fracture surgery Right 2009    fell playing with dog  . Labrum repair Right 2011  . Thyroidectomy  1997  . Endometrial ablation  2003   Family History  Problem Relation Age of Onset  . Hyperlipidemia Father   . Hypertension Father   . Heart failure Father 61  . Diabetes Sister   . Arthritis Paternal Grandmother   . Kidney disease Paternal Grandmother   . Diabetes Paternal Grandmother   . Arthritis Paternal Grandfather    History  Substance Use Topics  . Smoking status: Former Smoker -- 15  years    Quit date: 01/16/1999  . Smokeless tobacco: Never Used  . Alcohol Use: Yes     Comment: occasional   OB History   Grav Para Term Preterm Abortions TAB SAB Ect Mult Living                 Review of Systems  Constitutional: Negative.   Respiratory: Negative.   Cardiovascular: Negative.       Allergies  Review of patient's allergies indicates no known allergies.  Home Medications   Current Outpatient Rx  Name  Route  Sig  Dispense  Refill  . ciprofloxacin (CIPRO) 500 MG tablet   Oral   Take 1 tablet (500 mg total) by mouth 2 (two) times daily.   14 tablet   0   . escitalopram (LEXAPRO) 10 MG tablet      TAKE ONE TABLET BY MOUTH ONCE DAILY   30 tablet   4   . fluticasone (FLONASE) 50 MCG/ACT nasal spray   Each Nare   Place 2 sprays into both nostrils daily.   16 g   2   . levothyroxine (SYNTHROID, LEVOTHROID) 112 MCG tablet   Oral   Take 1 tablet (112 mcg total) by mouth daily before breakfast.   30 tablet   5   . LORazepam (ATIVAN) 0.5 MG tablet  TAKE ONE TABLET BY MOUTH THREE TIMES DAILY AS NEEDED FOR ANXIETY   30 tablet   0   . metroNIDAZOLE (FLAGYL) 500 MG tablet   Oral   Take 1 tablet (500 mg total) by mouth 2 (two) times daily.   14 tablet   0   . ondansetron (ZOFRAN) 4 MG tablet   Oral   Take 1 tablet (4 mg total) by mouth every 8 (eight) hours as needed for nausea or vomiting.   20 tablet   0    BP 156/80  Pulse 82  Temp(Src) 97.8 F (36.6 C) (Oral)  Resp 18  Ht 5\' 3"  (1.6 m)  Wt 136 lb (61.689 kg)  BMI 24.10 kg/m2  SpO2 100% Physical Exam  Nursing note and vitals reviewed. Constitutional: She is oriented to person, place, and time. She appears well-developed and well-nourished.  Cardiovascular: Normal rate and regular rhythm.   Pulmonary/Chest: Effort normal and breath sounds normal.  Abdominal: Soft. Bowel sounds are normal.  Diffusely tender  Musculoskeletal: Normal range of motion.  Neurological: She is alert  and oriented to person, place, and time.  Skin: Skin is warm and dry.    ED Course  Procedures (including critical care time) Labs Review Labs Reviewed  COMPREHENSIVE METABOLIC PANEL - Abnormal; Notable for the following:    Total Bilirubin 0.2 (*)    All other components within normal limits  URINALYSIS, ROUTINE W REFLEX MICROSCOPIC - Abnormal; Notable for the following:    APPearance CLOUDY (*)    Hgb urine dipstick LARGE (*)    Ketones, ur 15 (*)    All other components within normal limits  URINE MICROSCOPIC-ADD ON - Abnormal; Notable for the following:    Squamous Epithelial / LPF FEW (*)    Crystals CA OXALATE CRYSTALS (*)    All other components within normal limits  CBC WITH DIFFERENTIAL  LIPASE, BLOOD   Imaging Review Ct Abdomen Pelvis W Contrast  04/08/2013   CLINICAL DATA:  Abdominal pain and diarrhea, history of diverticulitis  EXAM: CT ABDOMEN AND PELVIS WITH CONTRAST  TECHNIQUE: Multidetector CT imaging of the abdomen and pelvis was performed using the standard protocol following bolus administration of intravenous contrast.  CONTRAST:  70mL OMNIPAQUE IOHEXOL 300 MG/ML SOLN, 167mL OMNIPAQUE IOHEXOL 300 MG/ML SOLN  COMPARISON:  None.  FINDINGS: Lung bases are free of acute infiltrate or sizable effusion. The liver, gallbladder, spleen, adrenal glands and pancreas are within normal limits. The kidneys are well visualized bilaterally. No enhancement abnormalities are seen. Normal excretion of contrast is noted bilaterally. No obstructive changes are noted. Fecal material is noted throughout the colon. The appendix is not visualized consistent with the patient's prior surgical history.  3.2 cm left ovarian cyst is seen. No pelvic mass lesion or free fluid is noted. Scattered diverticular change is noted without diverticulitis. No acute bony abnormality is seen.  IMPRESSION: Left ovarian cyst.  No acute abnormality is noted.   Electronically Signed   By: Inez Catalina M.D.   On:  04/08/2013 21:14     EKG Interpretation None      MDM   Final diagnoses:  Abdominal pain  Diarrhea    Pt is comfortable at this time. Pt out of flagyl so will prophylactically treat for c diff. Cultures sent. No acute findings noted on ct scan    Glendell Docker, NP 04/08/13 2158

## 2013-04-09 ENCOUNTER — Encounter: Payer: Self-pay | Admitting: Family

## 2013-04-09 LAB — CLOSTRIDIUM DIFFICILE BY PCR: Toxigenic C. Difficile by PCR: NOT DETECTED

## 2013-04-10 ENCOUNTER — Ambulatory Visit: Payer: BC Managed Care – PPO | Admitting: Nurse Practitioner

## 2013-04-10 NOTE — Telephone Encounter (Signed)
cipro that she took should have cleared any infection.  She can go to lab this afternoon and give urine sample for culture to confirm no UTI.

## 2013-04-10 NOTE — Telephone Encounter (Signed)
See 04/09/13 mychart message re: labs.

## 2013-04-10 NOTE — Telephone Encounter (Signed)
Spoke with pt. She did receive above mychart message. States that due to money and time missed from work she cancelled her GI appt for today. States she is going to call them today to try and r/s consultation if the diarrhea doesn't stop.  She also states that she has burning with urination since last night and was told in the ER on 04/08/13 that she did not have a UTI.  Please advise.

## 2013-04-10 NOTE — Telephone Encounter (Signed)
Please call pt and review my mychart recommendations from last night which answer her questions.

## 2013-04-11 LAB — STOOL CULTURE

## 2013-04-12 ENCOUNTER — Encounter: Payer: Self-pay | Admitting: Family

## 2013-04-12 NOTE — Assessment & Plan Note (Signed)
Continue cipro/flagyl, obtain stool studies, refer to gi, pt instructed to go to the ER if symptoms worsen or if symptoms do not improve.

## 2013-04-13 ENCOUNTER — Encounter: Payer: Self-pay | Admitting: Family

## 2013-04-13 MED ORDER — FLUCONAZOLE 150 MG PO TABS: ORAL_TABLET | ORAL | Status: DC

## 2013-04-13 NOTE — Telephone Encounter (Signed)
Spoke with pt and she states symptoms have cleared. Reports that she felt like she may have had a kidney stone as symptoms went away after several urinations. Continues to have abdominal pain and diarrhea but she has contacted her GI specialist for further recommendations.

## 2013-04-13 NOTE — Telephone Encounter (Signed)
Left message for pt to return my call re: instructions from Dr Charlett Blake below:  I have diarrhea get better when I treat yeast so go ahead and let her know we can try an empiric trial of Diflucan. I write Diflcuan 150 mg tabs 2 tabs po qd x 3 days then 1 tab po weekly x 3 weeks, disp#9. Have her start a good probiotic such as Align or Digestive Advantage at teh same time to regrow the good stuff. Minimize carbohydrates and if taking a statin just do not take on the days taking the Diflucan

## 2013-04-13 NOTE — Telephone Encounter (Signed)
Spoke with pt re: below instructions. Rx sent to [facility] at pt's request. Pt already taking probiotic and states she has scheduled appt with GI for 04/24/13.

## 2013-04-19 ENCOUNTER — Encounter: Payer: Self-pay | Admitting: Family

## 2013-04-20 MED ORDER — LORAZEPAM 0.5 MG PO TABS: ORAL_TABLET | ORAL | Status: DC

## 2013-04-20 NOTE — Telephone Encounter (Signed)
Rx called to pharmacy voicemail, #30 x no refills. Last rx provided, #30 on 03/13/13.

## 2013-04-21 ENCOUNTER — Encounter: Payer: Self-pay | Admitting: *Deleted

## 2013-04-24 ENCOUNTER — Ambulatory Visit: Payer: BC Managed Care – PPO | Admitting: Nurse Practitioner

## 2013-05-11 ENCOUNTER — Encounter: Payer: Self-pay | Admitting: Family

## 2013-05-24 ENCOUNTER — Encounter: Payer: Self-pay | Admitting: Nurse Practitioner

## 2013-05-25 ENCOUNTER — Encounter: Payer: Self-pay | Admitting: Family

## 2013-05-26 MED ORDER — LORAZEPAM 0.5 MG PO TABS: ORAL_TABLET | ORAL | Status: DC

## 2013-05-26 NOTE — Telephone Encounter (Signed)
Please call in lorazepam as below. Thanks.

## 2013-05-26 NOTE — Telephone Encounter (Signed)
Rx called to pharmacy voicemail. 

## 2013-06-26 ENCOUNTER — Telehealth: Payer: Self-pay | Admitting: *Deleted

## 2013-06-26 MED ORDER — LEVOTHYROXINE SODIUM 112 MCG PO TABS: 112.0000 ug | ORAL_TABLET | Freq: Every day | ORAL | Status: DC

## 2013-06-26 NOTE — Telephone Encounter (Signed)
Pt should schedule follow up.  OK to send 90 day supply no additional refills.

## 2013-06-26 NOTE — Telephone Encounter (Signed)
Rx sent. Please call pt to arrange follow up of thyroid.

## 2013-06-26 NOTE — Telephone Encounter (Signed)
Left detailed message informing patient of medication refill and to call our office to schedule follow up.

## 2013-06-26 NOTE — Telephone Encounter (Signed)
Received fax from [facility] requesting Rx of L-thyroxine 12mcg. 90 day supply sent. Pt last TSH was 11/2012 and she has not future appointments on file.  When should pt follow up in the office?

## 2013-06-29 NOTE — Telephone Encounter (Signed)
Left message for patient to return my call.

## 2013-06-30 NOTE — Telephone Encounter (Signed)
Left message for patient to return my call.

## 2013-07-21 ENCOUNTER — Encounter: Payer: Self-pay | Admitting: Family

## 2013-07-27 ENCOUNTER — Encounter: Payer: Self-pay | Admitting: Family

## 2013-07-27 ENCOUNTER — Ambulatory Visit (INDEPENDENT_AMBULATORY_CARE_PROVIDER_SITE_OTHER): Payer: BC Managed Care – PPO | Admitting: Family

## 2013-07-27 VITALS — BP 120/80 | HR 71 | Temp 98.0°F | Resp 16 | Ht 64.0 in | Wt 136.0 lb

## 2013-07-27 DIAGNOSIS — G5600 Carpal tunnel syndrome, unspecified upper limb: Secondary | ICD-10-CM

## 2013-07-27 DIAGNOSIS — G5603 Carpal tunnel syndrome, bilateral upper limbs: Secondary | ICD-10-CM

## 2013-07-27 MED ORDER — MELOXICAM 7.5 MG PO TABS: 7.5000 mg | ORAL_TABLET | Freq: Every day | ORAL | Status: DC

## 2013-07-27 NOTE — Patient Instructions (Signed)
Wear your wrist braces at night as as able during the day.  Continue to ice as needed. Start meloxicam once daily as needed for pain. Call if symptoms worsen or if not improved in 2-3 weeks.   Carpal Tunnel Syndrome The carpal tunnel is an area under the skin of the palm of your hand. Nerves, blood vessels, and strong tissues (tendons) pass through the tunnel. The tunnel can become puffy (swollen). If this happens, a nerve can be pinched in the wrist. This causes carpal tunnel syndrome.  HOME CARE  Take all medicine as told by your doctor.  If you were given a splint, wear it as told. Wear it at night or at times when your doctor told you to.  Rest your wrist from the activity that causes your pain.  Put ice on your wrist after long periods of wrist activity.  Put ice in a plastic bag.  Place a towel between your skin and the bag.  Leave the ice on for 15-20 minutes, 03-04 times a day.  Keep all doctor visits as told. GET HELP RIGHT AWAY IF:  You have new problems you cannot explain.  Your problems get worse and medicine does not help. MAKE SURE YOU:   Understand these instructions.  Will watch your condition.  Will get help right away if you are not doing well or get worse. Document Released: 12/21/2010 Document Revised: 03/26/2011 Document Reviewed: 12/21/2010 [facility] Patient Information 2015 [place], [place]. This information is not intended to replace advice given to you by your health care provider. Make sure you discuss any questions you have with your health care provider.

## 2013-07-27 NOTE — Progress Notes (Signed)
Subjective:    Patient ID: Donna Anderson, female    DOB: 22-Mar-1960, 53 y.o.   MRN: [id number]  HPI  Pt reports bilateral hand pain since Wednesday. Left is worse than the right and feels untolerable. Has been applying ice and wearing brace and sees slight improvement. Returned to work today and pain has worsened again.  Reports that the pain has been keeping her awake.  Reports that she is working at [employer] and has been doing pulling at work.  Fingers are tingling. Started wearing a wrist brace. This is helping some.  Reports some improvement with ibuprofen and ice.     Review of Systems See HPI  Past Medical History  Diagnosis Date  . Arthritis   . History of chicken pox   . Depression   . Diverticulosis 2015    CT SCAN   . Hyperlipidemia   . Kidney stones   . History of migraine   . Graves disease 1997  . Hearing loss     History   Social History  . Marital Status: Married    Spouse Name: N/A    Number of Children: N/A  . Years of Education: N/A   Occupational History  . Not on file.   Social History Main Topics  . Smoking status: Former Smoker -- 15 years    Quit date: 01/16/1999  . Smokeless tobacco: Never Used  . Alcohol Use: Yes     Comment: occasional  . Drug Use: No  . Sexual Activity: Yes    Birth Control/ Protection: None   Other Topics Concern  . Not on file   Social History Narrative   Married (second marriage) has a 24 yr old son in [place]   Worked in Psychologist, educational   Enjoys exercising, hiking, kayaking.    Completed 12th grade   1 dog, 1 cat          Past Surgical History  Procedure Laterality Date  . Appendectomy  2001  . Oophorectomy Right 2001    due to large ovarian cyst  . Hip fracture surgery Right 2009    fell playing with dog  . Labrum repair Right 2011  . Thyroidectomy  1997  . Endometrial ablation  2003    Family History  Problem Relation Age of Onset  . Hyperlipidemia Father   . Hypertension Father   . Heart  failure Father 39  . Diabetes Sister   . Arthritis Paternal Grandmother   . Kidney disease Paternal Grandmother   . Diabetes Paternal Grandmother   . Arthritis Paternal Grandfather     No Known Allergies  Current Outpatient Prescriptions on File Prior to Visit  Medication Sig Dispense Refill  . levothyroxine (SYNTHROID, LEVOTHROID) 112 MCG tablet Take 1 tablet (112 mcg total) by mouth daily before breakfast.  90 tablet  0  . LORazepam (ATIVAN) 0.5 MG tablet TAKE ONE TABLET BY MOUTH THREE TIMES DAILY AS NEEDED FOR ANXIETY  30 tablet  0   No current facility-administered medications on file prior to visit.    BP 120/80  Pulse 71  Temp(Src) 98 F (36.7 C) (Oral)  Resp 16  Ht 5\' 4"  (1.626 m)  Wt 136 lb 0.6 oz (61.707 kg)  BMI 23.34 kg/m2  SpO2 99%       Objective:   Physical Exam  Constitutional: She appears well-developed and well-nourished. No distress.  Cardiovascular: Normal rate and regular rhythm.   No murmur heard. Pulmonary/Chest: Effort normal and breath sounds  normal. No respiratory distress. She has no wheezes. She has no rales. She exhibits no tenderness.  Musculoskeletal:  Neg tinnels, + phalan bilaterally          Assessment & Plan:

## 2013-07-27 NOTE — Progress Notes (Signed)
Pre visit review using our clinic review tool, if applicable. No additional management support is needed unless otherwise documented below in the visit note. 

## 2013-07-30 DIAGNOSIS — G5600 Carpal tunnel syndrome, unspecified upper limb: Secondary | ICD-10-CM | POA: Insufficient documentation

## 2013-07-30 NOTE — Assessment & Plan Note (Signed)
Continue cock up wrist splints.  rx with meloxicam.

## 2013-09-08 ENCOUNTER — Encounter: Payer: Self-pay | Admitting: Nurse Practitioner

## 2013-09-09 ENCOUNTER — Encounter: Payer: Self-pay | Admitting: Family

## 2013-09-11 NOTE — Telephone Encounter (Signed)
Donna Anderson, could you please help with this?  Thanks

## 2013-09-14 NOTE — Telephone Encounter (Signed)
Spoke with pt and scheduled lab appt for 09/16/13 at 4pm.  Will wait to send refill until after lab result reviewed.

## 2013-09-16 ENCOUNTER — Other Ambulatory Visit (INDEPENDENT_AMBULATORY_CARE_PROVIDER_SITE_OTHER): Payer: BC Managed Care – PPO

## 2013-09-16 DIAGNOSIS — E039 Hypothyroidism, unspecified: Secondary | ICD-10-CM

## 2013-09-17 ENCOUNTER — Encounter: Payer: Self-pay | Admitting: Family

## 2013-09-17 LAB — TSH: TSH: 1.26 u[IU]/mL (ref 0.35–4.50)

## 2013-09-17 NOTE — Telephone Encounter (Signed)
open in error

## 2013-09-18 MED ORDER — LEVOTHYROXINE SODIUM 112 MCG PO TABS: 112.0000 ug | ORAL_TABLET | Freq: Every day | ORAL | Status: DC

## 2013-09-18 NOTE — Telephone Encounter (Signed)
See patient email dated 09/17/13.

## 2013-09-28 ENCOUNTER — Encounter: Payer: Self-pay | Admitting: Family

## 2013-09-29 ENCOUNTER — Ambulatory Visit (INDEPENDENT_AMBULATORY_CARE_PROVIDER_SITE_OTHER): Payer: BC Managed Care – PPO | Admitting: Physician Assistant

## 2013-09-29 ENCOUNTER — Encounter: Payer: Self-pay | Admitting: Physician Assistant

## 2013-09-29 VITALS — BP 121/67 | HR 64 | Temp 98.0°F | Resp 16 | Ht 64.0 in | Wt 138.0 lb

## 2013-09-29 DIAGNOSIS — G5620 Lesion of ulnar nerve, unspecified upper limb: Secondary | ICD-10-CM

## 2013-09-29 DIAGNOSIS — G5621 Lesion of ulnar nerve, right upper limb: Secondary | ICD-10-CM

## 2013-09-29 MED ORDER — MELOXICAM 15 MG PO TABS: 15.0000 mg | ORAL_TABLET | Freq: Every day | ORAL | Status: DC

## 2013-09-29 NOTE — Patient Instructions (Signed)
Please use Mobic as directed.  Elevate your elbow on a pillow at night.  You can wrap a towel around the arm to help immobilize it at night.  Use tylenol for breakthrough pain.  Call or return to clinic if symptoms are not improving.

## 2013-09-29 NOTE — Progress Notes (Signed)
Pre visit review using our clinic review tool, if applicable. No additional management support is needed unless otherwise documented below in the visit note/SLS  

## 2013-09-30 DIAGNOSIS — G5621 Lesion of ulnar nerve, right upper limb: Secondary | ICD-10-CM | POA: Insufficient documentation

## 2013-09-30 NOTE — Progress Notes (Signed)
Patient presents to clinic today c/o right elbow pain x 3 weeks associated with numbness and tingling of 3rd, 4th and 5th fingers.  Patient endorses heavy lifting at work.  Does not play tennis or golf.  Denies repetitive twisting motion of elbows.  Endorses history of Carpal Tunnel syndrome.  Denies known trauma or injury.  Past Medical History  Diagnosis Date  . Arthritis   . History of chicken pox   . Depression   . Diverticulosis 2015    CT SCAN   . Hyperlipidemia   . Kidney stones   . History of migraine   . Graves disease 1997  . Hearing loss     Current Outpatient Prescriptions on File Prior to Visit  Medication Sig Dispense Refill  . levothyroxine (SYNTHROID, LEVOTHROID) 112 MCG tablet Take 1 tablet (112 mcg total) by mouth daily before breakfast.  90 tablet  1   No current facility-administered medications on file prior to visit.    No Known Allergies  Family History  Problem Relation Age of Onset  . Hyperlipidemia Father   . Hypertension Father   . Heart failure Father 53  . Diabetes Sister   . Arthritis Paternal Grandmother   . Kidney disease Paternal Grandmother   . Diabetes Paternal Grandmother   . Arthritis Paternal Grandfather     History   Social History  . Marital Status: Married    Spouse Name: N/A    Number of Children: N/A  . Years of Education: N/A   Social History Main Topics  . Smoking status: Former Smoker -- 15 years    Quit date: 01/16/1999  . Smokeless tobacco: Never Used  . Alcohol Use: Yes     Comment: occasional  . Drug Use: No  . Sexual Activity: Yes    Birth Control/ Protection: None   Other Topics Concern  . None   Social History Narrative   Married (second marriage) has a 55 yr old son in [place]   Worked in Psychologist, educational   Enjoys exercising, hiking, kayaking.    Completed 12th grade   1 dog, 1 cat         Review of Systems - See HPI.  All other ROS are negative.  BP 121/67  Pulse 64  Temp(Src) 98 F (36.7 C)  (Oral)  Resp 16  Ht 5\' 4"  (1.626 m)  Wt 138 lb (62.596 kg)  BMI 23.68 kg/m2  SpO2 100%  Physical Exam  Constitutional: She is oriented to person, place, and time and well-developed, well-nourished, and in no distress.  HENT:  Head: Normocephalic and atraumatic.  Cardiovascular: Normal rate, regular rhythm, normal heart sounds and intact distal pulses.   Pulmonary/Chest: Effort normal and breath sounds normal.  Musculoskeletal:       Right elbow: She exhibits normal range of motion and no swelling. Tenderness found. Lateral epicondyle tenderness noted.       Right wrist: Normal.       Right forearm: Normal.       Left forearm: Normal.       Right hand: She exhibits normal range of motion, normal two-point discrimination, normal capillary refill and no swelling. Decreased sensation noted. Decreased sensation is present in the ulnar distribution. Normal strength noted.       Left hand: Normal.  Neurological: She is alert and oriented to person, place, and time.  Skin: Skin is warm and dry. No rash noted.  Psychiatric: Affect normal.    Recent Results (from the  past 2160 hour(s))  TSH     Status: None   Collection Time    09/16/13  3:59 PM      Result Value Ref Range   TSH 1.26  0.35 - 4.50 uIU/mL    Assessment/Plan: Cubital tunnel syndrome on right Rx Mobic.  Topical Aspercreme.  Avoid heavy lifting or overexertion.  Avoid prolonged flexion. Elevate elbow on pillow at bedtime. Towel or Ace wrap can be used at night to limit mobility.  Return precautions discussed with patient.

## 2013-09-30 NOTE — Assessment & Plan Note (Signed)
Rx Mobic.  Topical Aspercreme.  Avoid heavy lifting or overexertion.  Avoid prolonged flexion. Elevate elbow on pillow at bedtime. Towel or Ace wrap can be used at night to limit mobility.  Return precautions discussed with patient.

## 2013-10-19 ENCOUNTER — Encounter: Payer: Self-pay | Admitting: Family

## 2013-10-20 ENCOUNTER — Encounter: Payer: Self-pay | Admitting: Family

## 2013-10-21 ENCOUNTER — Encounter: Payer: Self-pay | Admitting: Family

## 2013-10-21 ENCOUNTER — Ambulatory Visit (INDEPENDENT_AMBULATORY_CARE_PROVIDER_SITE_OTHER): Payer: BC Managed Care – PPO | Admitting: Family

## 2013-10-21 VITALS — BP 127/84 | HR 70 | Temp 98.4°F | Resp 16 | Ht 64.0 in | Wt 138.6 lb

## 2013-10-21 DIAGNOSIS — M7711 Lateral epicondylitis, right elbow: Secondary | ICD-10-CM

## 2013-10-21 DIAGNOSIS — M25521 Pain in right elbow: Secondary | ICD-10-CM

## 2013-10-21 DIAGNOSIS — Z23 Encounter for immunization: Secondary | ICD-10-CM

## 2013-10-21 NOTE — Progress Notes (Signed)
Subjective:    Patient ID: Donna Anderson, female    DOB: Oct 06, 1960, 52 y.o.   MRN: [id number]  HPI Donna Anderson is a 53 year old female who presents today with chief complaint of right elbow pain.    1. Right elbow pain - Started 3 weeks ago and has gradually has gotten worse.  Was seen in this office 3 weeks ago and pain has never alleviated. Pain is isolated to her lateral right elbow and is constant and wakes her up at night. She recently stopped working at  [employer] where she did  lots of heavy lifting and arranging things on the shelves with over head lfting. Has taken meloxicam without relief.   \Review of Systems  Constitutional: Negative.   HENT: Negative.   Eyes: Negative.   Respiratory: Negative.   Cardiovascular: Negative.   Musculoskeletal: Negative for back pain, gait problem and joint swelling.  Skin: Negative.    Past Medical History  Diagnosis Date  . Arthritis   . History of chicken pox   . Depression   . Diverticulosis 2015    CT SCAN   . Hyperlipidemia   . Kidney stones   . History of migraine   . Graves disease 1997  . Hearing loss     History   Social History  . Marital Status: Married    Spouse Name: N/A    Number of Children: N/A  . Years of Education: N/A   Occupational History  . Not on file.   Social History Main Topics  . Smoking status: Former Smoker -- 15 years    Quit date: 01/16/1999  . Smokeless tobacco: Never Used  . Alcohol Use: Yes     Comment: occasional  . Drug Use: No  . Sexual Activity: Yes    Birth Control/ Protection: None   Other Topics Concern  . Not on file   Social History Narrative   Married (second marriage) has a 66 yr old son in [place]   Worked in Psychologist, educational   Enjoys exercising, hiking, kayaking.    Completed 12th grade   1 dog, 1 cat          Past Surgical History  Procedure Laterality Date  . Appendectomy  2001  . Oophorectomy Right 2001    due to large ovarian cyst  . Hip fracture surgery  Right 2009    fell playing with dog  . Labrum repair Right 2011  . Thyroidectomy  1997  . Endometrial ablation  2003    Family History  Problem Relation Age of Onset  . Hyperlipidemia Father   . Hypertension Father   . Heart failure Father 25  . Diabetes Sister   . Arthritis Paternal Grandmother   . Kidney disease Paternal Grandmother   . Diabetes Paternal Grandmother   . Arthritis Paternal Grandfather     No Known Allergies  Current Outpatient Prescriptions on File Prior to Visit  Medication Sig Dispense Refill  . levothyroxine (SYNTHROID, LEVOTHROID) 112 MCG tablet Take 1 tablet (112 mcg total) by mouth daily before breakfast.  90 tablet  1  . meloxicam (MOBIC) 15 MG tablet Take 1 tablet (15 mg total) by mouth daily.  30 tablet  0   No current facility-administered medications on file prior to visit.    BP 127/84  Pulse 70  Temp(Src) 98.4 F (36.9 C) (Oral)  Resp 16  Ht 5\' 4"  (1.626 m)  Wt 138 lb 9.6 oz (62.869 kg)  BMI  23.78 kg/m2  SpO2 99%       Objective:   Physical Exam  Constitutional: She appears well-developed and well-nourished.  HENT:  Head: Normocephalic and atraumatic.  Cardiovascular: Normal rate, regular rhythm and normal heart sounds.   No murmur heard. Pulmonary/Chest: Effort normal and breath sounds normal. No respiratory distress.  Musculoskeletal:       Right elbow: She exhibits normal range of motion, no swelling, no deformity and no laceration. Tenderness found. Lateral epicondyle tenderness noted.  Right arm is 5/5 strength, left arm is 5/5 strength.  Grip strength is 5/5 bilaterally, no numbness or tingling in fingers.   Neurological:  Reflex Scores:      Tricep reflexes are 2+ on the right side and 2+ on the left side.      Brachioradialis reflexes are 2+ on the right side and 2+ on the left side. Skin: Skin is warm and dry.          Assessment & Plan:  Will refer to sports medicine.  Should continue Meloxicam as needed.  Would  like flu shot today.    I have personally seen and examined patient and agree with Jettie Booze NP student's assessment and plan.

## 2013-10-21 NOTE — Progress Notes (Signed)
Pre visit review using our clinic review tool, if applicable. No additional management support is needed unless otherwise documented below in the visit note. 

## 2013-10-21 NOTE — Patient Instructions (Signed)
You will be contacted about your referral to sports medicine.  Continue meloxicam.

## 2013-10-22 DIAGNOSIS — M7710 Lateral epicondylitis, unspecified elbow: Secondary | ICD-10-CM | POA: Insufficient documentation

## 2013-10-22 NOTE — Assessment & Plan Note (Signed)
Continue meloxicam, refer to sports med or further evaluation.

## 2013-10-26 ENCOUNTER — Ambulatory Visit: Payer: BC Managed Care – PPO | Admitting: Family Medicine

## 2013-10-28 ENCOUNTER — Encounter: Payer: Self-pay | Admitting: Family Medicine

## 2013-11-02 ENCOUNTER — Ambulatory Visit (INDEPENDENT_AMBULATORY_CARE_PROVIDER_SITE_OTHER): Payer: BC Managed Care – PPO | Admitting: Family Medicine

## 2013-11-02 ENCOUNTER — Encounter: Payer: Self-pay | Admitting: Family Medicine

## 2013-11-02 VITALS — BP 157/99 | HR 73 | Ht 63.0 in | Wt 138.0 lb

## 2013-11-02 DIAGNOSIS — M7711 Lateral epicondylitis, right elbow: Secondary | ICD-10-CM

## 2013-11-02 NOTE — Patient Instructions (Signed)
You have lateral epicondylitis (tennis elbow) Try to avoid painful activities as much as possible. Ice the area 3-4 times a day for 15 minutes at a time. Tylenol or aleve as needed for pain. Counterforce brace or elbow sleeve as directed can help unload area - wear this regularly if it provides you with relief. Home Pronation/supination with hammer, wrist extension with 1 pound weight, stretching - do these once a day. Consider formal physical therapy. Consider injection for short term pain relief if the above is not helping. Consider nitro patches if not improving also. Follow up with me in 6 weeks for reevaluation.

## 2013-11-03 ENCOUNTER — Encounter: Payer: Self-pay | Admitting: Family Medicine

## 2013-11-03 NOTE — Assessment & Plan Note (Signed)
reviewed home exercise program to do daily.  Elbow sleeve or counterforce brace.  Icing, tylenol/aleve as needed.  Consider physical therapy, nitro patches, injection if not improving.  Follow up with me in 6 weeks for reevaluation.

## 2013-11-03 NOTE — Progress Notes (Signed)
Patient ID: Donna Anderson, female   DOB: 24-Mar-1960, 53 y.o.   MRN: [id number]  PCP and Referred by: Nance Pear., NP  Subjective:   HPI: Patient is a 53 y.o. female here for right elbow/forearm pain.  Patient denies known injury or trauma. She states for about a month she's had pain lateral aspect of right elbow and forearm. She used to work at [employer] and did a lot of repetitive lifting, pulling. Has improved some since she's no longer working there. No numbness or tingling. Pain worse when she bends wrist backwards (extension). No other complaints. Right handed.  Past Medical History  Diagnosis Date  . Arthritis   . History of chicken pox   . Depression   . Diverticulosis 2015    CT SCAN   . Hyperlipidemia   . Kidney stones   . History of migraine   . Graves disease 1997  . Hearing loss     Current Outpatient Prescriptions on File Prior to Visit  Medication Sig Dispense Refill  . levothyroxine (SYNTHROID, LEVOTHROID) 112 MCG tablet Take 1 tablet (112 mcg total) by mouth daily before breakfast.  90 tablet  1  . meloxicam (MOBIC) 15 MG tablet Take 1 tablet (15 mg total) by mouth daily.  30 tablet  0   No current facility-administered medications on file prior to visit.    Past Surgical History  Procedure Laterality Date  . Appendectomy  2001  . Oophorectomy Right 2001    due to large ovarian cyst  . Hip fracture surgery Right 2009    fell playing with dog  . Labrum repair Right 2011  . Thyroidectomy  1997  . Endometrial ablation  2003    No Known Allergies  History   Social History  . Marital Status: Married    Spouse Name: N/A    Number of Children: N/A  . Years of Education: N/A   Occupational History  . Not on file.   Social History Main Topics  . Smoking status: Former Smoker -- 15 years    Quit date: 01/16/1999  . Smokeless tobacco: Never Used  . Alcohol Use: Yes     Comment: occasional  . Drug Use: No  . Sexual Activity: Yes     Birth Control/ Protection: None   Other Topics Concern  . Not on file   Social History Narrative   Married (second marriage) has a 70 yr old son in [place]   Worked in Psychologist, educational   Enjoys exercising, hiking, kayaking.    Completed 12th grade   1 dog, 1 cat          Family History  Problem Relation Age of Onset  . Hyperlipidemia Father   . Hypertension Father   . Heart failure Father 44  . Diabetes Sister   . Arthritis Paternal Grandmother   . Kidney disease Paternal Grandmother   . Diabetes Paternal Grandmother   . Arthritis Paternal Grandfather     BP 157/99  Pulse 73  Ht 5\' 3"  (1.6 m)  Wt 138 lb (62.596 kg)  BMI 24.45 kg/m2  Review of Systems: See HPI above.    Objective:  Physical Exam:  Gen: NAD  Right elbow: No gross deformity, swelling, bruising. TTP lateral epicondyle and just distal to this within extensor mass of forearm. FROM elbow without pain. Pain reproduced with wrist extension and 3rd digit extension, less with supination/pronation. Collateral ligaments intact. NVI distally.    Assessment & Plan:  1. Right elbow  lateral epicondylitis - reviewed home exercise program to do daily.  Elbow sleeve or counterforce brace.  Icing, tylenol/aleve as needed.  Consider physical therapy, nitro patches, injection if not improving.  Follow up with me in 6 weeks for reevaluation.

## 2013-11-05 ENCOUNTER — Encounter: Payer: Self-pay | Admitting: Family Medicine

## 2013-11-06 ENCOUNTER — Encounter: Payer: Self-pay | Admitting: Family Medicine

## 2013-11-10 ENCOUNTER — Telehealth: Payer: Self-pay | Admitting: *Deleted

## 2013-11-10 NOTE — Telephone Encounter (Signed)
Spoke with patient and she stated that the arm is not any better. It is still painful and she can't rest at night. Would like to know what to do next or if she should make an appointment.

## 2013-11-10 NOTE — Telephone Encounter (Signed)
See my note - we discussed physical therapy, nitro patches, or injection if she was not improving and wanted to try something else.

## 2013-11-12 ENCOUNTER — Ambulatory Visit (INDEPENDENT_AMBULATORY_CARE_PROVIDER_SITE_OTHER): Payer: BC Managed Care – PPO | Admitting: Family Medicine

## 2013-11-12 ENCOUNTER — Encounter: Payer: Self-pay | Admitting: Family Medicine

## 2013-11-12 VITALS — BP 128/88 | HR 82 | Ht 63.0 in | Wt 138.0 lb

## 2013-11-12 DIAGNOSIS — M7711 Lateral epicondylitis, right elbow: Secondary | ICD-10-CM

## 2013-11-12 MED ORDER — METHYLPREDNISOLONE ACETATE 40 MG/ML IJ SUSP
40.0000 mg | Freq: Once | INTRAMUSCULAR | Status: AC
  Administered 2013-11-12: 40 mg via INTRA_ARTICULAR

## 2013-11-12 NOTE — Patient Instructions (Signed)
You were given a cortisone shot today. If physical therapy is not too expensive I would recommend starting this in a week. If it is too expensive I would recommend doing the nitro patches. Wait a week regardless before restarting the home exercises. Continue icing as you have been.

## 2013-11-13 ENCOUNTER — Ambulatory Visit: Payer: Self-pay | Admitting: Family Medicine

## 2013-11-16 NOTE — Assessment & Plan Note (Addendum)
Right elbow lateral epicondylitis - Went ahead with injection today.  Consider physical therapy in a week if she can afford - going to look into cost.  Otherwise would start nitro patches and wait a week before restarting home exercise program.  Elbow sleeve or counterforce brace.  Icing, tylenol/aleve as needed.  Follow up with me in 6 weeks for reevaluation.  After informed written consent patient was seated on exam table.  Area overlying right lateral epicondyle (just distal to this) prepped with alcohol swab then injected with 2:1 marcaine: depomedrol with multiple needle fenestrations.  Patient tolerated procedure well without immediate complications.

## 2013-11-16 NOTE — Progress Notes (Signed)
Patient ID: Donna Anderson, female   DOB: 01/06/1961, 53 y.o.   MRN: [id number]  PCP and Referred by: Nance Pear., NP  Subjective:   HPI: Patient is a 53 y.o. female here for right elbow/forearm pain.  10/19: Patient denies known injury or trauma. She states for about a month she's had pain lateral aspect of right elbow and forearm. She used to work at [employer] and did a lot of repetitive lifting, pulling. Has improved some since she's no longer working there. No numbness or tingling. Pain worse when she bends wrist backwards (extension). No other complaints. Right handed.  10/29: Pateint reports pain in right elbow is much worse. Up to 10/10 level with activities. Unable to do PT due to cost and doing home exercises hurts worse in elbow. Has been icing. Is right handed.  Past Medical History  Diagnosis Date  . Arthritis   . History of chicken pox   . Depression   . Diverticulosis 2015    CT SCAN   . Hyperlipidemia   . Kidney stones   . History of migraine   . Graves disease 1997  . Hearing loss     Current Outpatient Prescriptions on File Prior to Visit  Medication Sig Dispense Refill  . levothyroxine (SYNTHROID, LEVOTHROID) 112 MCG tablet Take 1 tablet (112 mcg total) by mouth daily before breakfast. 90 tablet 1  . meloxicam (MOBIC) 15 MG tablet Take 1 tablet (15 mg total) by mouth daily. 30 tablet 0   No current facility-administered medications on file prior to visit.    Past Surgical History  Procedure Laterality Date  . Appendectomy  2001  . Oophorectomy Right 2001    due to large ovarian cyst  . Hip fracture surgery Right 2009    fell playing with dog  . Labrum repair Right 2011  . Thyroidectomy  1997  . Endometrial ablation  2003    No Known Allergies  History   Social History  . Marital Status: Married    Spouse Name: N/A    Number of Children: N/A  . Years of Education: N/A   Occupational History  . Not on file.   Social  History Main Topics  . Smoking status: Former Smoker -- 15 years    Quit date: 01/16/1999  . Smokeless tobacco: Never Used  . Alcohol Use: Yes     Comment: occasional  . Drug Use: No  . Sexual Activity: Yes    Birth Control/ Protection: None   Other Topics Concern  . Not on file   Social History Narrative   Married (second marriage) has a 70 yr old son in [place]   Worked in Psychologist, educational   Enjoys exercising, hiking, kayaking.    Completed 12th grade   1 dog, 1 cat          Family History  Problem Relation Age of Onset  . Hyperlipidemia Father   . Hypertension Father   . Heart failure Father 88  . Diabetes Sister   . Arthritis Paternal Grandmother   . Kidney disease Paternal Grandmother   . Diabetes Paternal Grandmother   . Arthritis Paternal Grandfather     BP 128/88 mmHg  Pulse 82  Ht 5\' 3"  (1.6 m)  Wt 138 lb (62.596 kg)  BMI 24.45 kg/m2  Review of Systems: See HPI above.    Objective:  Physical Exam:  Gen: NAD  Right elbow: No gross deformity, swelling, bruising. TTP lateral epicondyle and just distal to this  within extensor mass of forearm. FROM elbow without pain. Pain reproduced with wrist extension and 3rd digit extension, less with supination/pronation. Collateral ligaments intact. NVI distally.    Assessment & Plan:  1. Right elbow lateral epicondylitis - Went ahead with injection today.  Consider physical therapy in a week if she can afford - going to look into cost.  Otherwise would start nitro patches and wait a week before restarting home exercise program.  Elbow sleeve or counterforce brace.  Icing, tylenol/aleve as needed.  Follow up with me in 6 weeks for reevaluation.  After informed written consent patient was seated on exam table.  Area overlying right lateral epicondyle (just distal to this) prepped with alcohol swab then injected with 2:1 marcaine: depomedrol with multiple needle fenestrations.  Patient tolerated procedure well without  immediate complications.

## 2013-11-18 ENCOUNTER — Encounter: Payer: Self-pay | Admitting: Family Medicine

## 2013-11-25 ENCOUNTER — Encounter: Payer: Self-pay | Admitting: *Deleted

## 2013-12-02 ENCOUNTER — Ambulatory Visit (INDEPENDENT_AMBULATORY_CARE_PROVIDER_SITE_OTHER): Payer: BC Managed Care – PPO | Admitting: Family Medicine

## 2013-12-02 ENCOUNTER — Encounter: Payer: Self-pay | Admitting: Family Medicine

## 2013-12-02 VITALS — BP 136/86 | HR 76 | Ht 63.0 in | Wt 138.0 lb

## 2013-12-02 DIAGNOSIS — M7711 Lateral epicondylitis, right elbow: Secondary | ICD-10-CM

## 2013-12-02 MED ORDER — NITROGLYCERIN 0.2 MG/HR TD PT24: MEDICATED_PATCH | TRANSDERMAL | Status: DC

## 2013-12-02 NOTE — Patient Instructions (Signed)
Start nitro patches - 1/4th patch over affected elbow, change daily. Follow up with me in 6 weeks for reevaluation. Only other options would be physical therapy which would be expensive or seeing an orthopedic surgeon to discuss surgery.

## 2013-12-03 NOTE — Assessment & Plan Note (Signed)
Right elbow lateral epicondylitis - Injection did help her both symptomatically and by exam.  Still struggling with activities though.  Discussed options - cannot afford physical therapy so will continue home exercises.  Add nitro patches - discussed risks of headaches, skin irritation.  Icing, tylenol/aleve as needed.  Follow up with me in 6 weeks for reevaluation.

## 2013-12-03 NOTE — Progress Notes (Signed)
Patient ID: Donna Anderson, female   DOB: [birth year], 53 y.o.   MRN: [id number]  PCP and Referred by: Nance Pear., NP  Subjective:   HPI: Patient is a 53 y.o. female here for right elbow/forearm pain.  10/19: Patient denies known injury or trauma. She states for about a month she's had pain lateral aspect of right elbow and forearm. She used to work at [employer] and did a lot of repetitive lifting, pulling. Has improved some since she's no longer working there. No numbness or tingling. Pain worse when she bends wrist backwards (extension). No other complaints. Right handed.  10/29: Pateint reports pain in right elbow is much worse. Up to 10/10 level with activities. Unable to do PT due to cost and doing home exercises hurts worse in elbow. Has been icing. Is right handed.  11/18: Patient returns as elbow has improved with injection - no longer constant pain at rest. However pain is still severe with activities. Elbow sleeve's compression makes worse. Doing home exercises - cannot afford physical therapy.  Past Medical History  Diagnosis Date  . Arthritis   . History of chicken pox   . Depression   . Diverticulosis 2015    CT SCAN   . Hyperlipidemia   . Kidney stones   . History of migraine   . Graves disease 1997  . Hearing loss     Current Outpatient Prescriptions on File Prior to Visit  Medication Sig Dispense Refill  . levothyroxine (SYNTHROID, LEVOTHROID) 112 MCG tablet Take 1 tablet (112 mcg total) by mouth daily before breakfast. 90 tablet 1  . meloxicam (MOBIC) 15 MG tablet Take 1 tablet (15 mg total) by mouth daily. 30 tablet 0   No current facility-administered medications on file prior to visit.    Past Surgical History  Procedure Laterality Date  . Appendectomy  2001  . Oophorectomy Right 2001    due to large ovarian cyst  . Hip fracture surgery Right 2009    fell playing with dog  . Labrum repair Right 2011  . Thyroidectomy  1997  .  Endometrial ablation  2003    No Known Allergies  History   Social History  . Marital Status: Married    Spouse Name: N/A    Number of Children: N/A  . Years of Education: N/A   Occupational History  . Not on file.   Social History Main Topics  . Smoking status: Former Smoker -- 15 years    Quit date: 01/16/1999  . Smokeless tobacco: Never Used  . Alcohol Use: 0.0 oz/week    0 Not specified per week     Comment: occasional  . Drug Use: No  . Sexual Activity: Yes    Birth Control/ Protection: None   Other Topics Concern  . Not on file   Social History Narrative   Married (second marriage) has a 62 yr old son in [place]   Worked in Psychologist, educational   Enjoys exercising, hiking, kayaking.    Completed 12th grade   1 dog, 1 cat          Family History  Problem Relation Age of Onset  . Hyperlipidemia Father   . Hypertension Father   . Heart failure Father 48  . Diabetes Sister   . Arthritis Paternal Grandmother   . Kidney disease Paternal Grandmother   . Diabetes Paternal Grandmother   . Arthritis Paternal Grandfather     BP 136/86 mmHg  Pulse 76  Ht 5'  3" (1.6 m)  Wt 138 lb (62.596 kg)  BMI 24.45 kg/m2  Review of Systems: See HPI above.    Objective:  Physical Exam:  Gen: NAD  Right elbow: No gross deformity, swelling, bruising. TTP lateral epicondyle and just distal to this within extensor mass of forearm. FROM elbow without pain. Minimal pain with wrist extension and 3rd digit extension, less with supination/pronation. Collateral ligaments intact. NVI distally.    Assessment & Plan:  1. Right elbow lateral epicondylitis - Injection did help her both symptomatically and by exam.  Still struggling with activities though.  Discussed options - cannot afford physical therapy so will continue home exercises.  Add nitro patches - discussed risks of headaches, skin irritation.  Icing, tylenol/aleve as needed.  Follow up with me in 6 weeks for reevaluation.

## 2014-01-09 ENCOUNTER — Encounter: Payer: Self-pay | Admitting: Family Medicine

## 2014-01-15 HISTORY — PX: ELBOW SURGERY: SHX618

## 2014-01-21 ENCOUNTER — Encounter: Payer: Self-pay | Admitting: Family Medicine

## 2014-01-21 NOTE — Addendum Note (Signed)
Addended by: Sherrie George F on: 01/21/2014 01:27 PM   Modules accepted: Orders

## 2014-01-22 ENCOUNTER — Encounter: Payer: Self-pay | Admitting: Rehabilitation

## 2014-01-22 ENCOUNTER — Encounter: Payer: Self-pay | Admitting: Family

## 2014-01-23 ENCOUNTER — Encounter: Payer: Self-pay | Admitting: Family

## 2014-01-25 NOTE — Telephone Encounter (Signed)
See 01/25/14 mychart message. Problem resolved on its own.

## 2014-02-08 ENCOUNTER — Other Ambulatory Visit: Payer: Self-pay | Admitting: Family

## 2014-02-08 ENCOUNTER — Ambulatory Visit: Payer: BLUE CROSS/BLUE SHIELD | Attending: Family Medicine | Admitting: Rehabilitation

## 2014-02-08 DIAGNOSIS — M7711 Lateral epicondylitis, right elbow: Secondary | ICD-10-CM | POA: Diagnosis not present

## 2014-02-08 DIAGNOSIS — M25521 Pain in right elbow: Secondary | ICD-10-CM | POA: Diagnosis not present

## 2014-02-08 NOTE — Telephone Encounter (Signed)
Pt last seen by PCP in 10/2013 for acute concern. Last TSH completed 09/2013 and pt has no future appts scheduled with PCP.  Refill sent to pharmacy. When should pt follow up in the office?

## 2014-02-08 NOTE — Telephone Encounter (Signed)
Follow up in march pls.

## 2014-02-10 NOTE — Telephone Encounter (Signed)
Left detailed message informing patient of med refill and to call our office to schedule appointment.

## 2014-02-11 ENCOUNTER — Ambulatory Visit: Payer: BLUE CROSS/BLUE SHIELD | Admitting: Rehabilitation

## 2014-02-15 ENCOUNTER — Ambulatory Visit: Payer: BLUE CROSS/BLUE SHIELD | Attending: Family Medicine | Admitting: Rehabilitation

## 2014-02-15 DIAGNOSIS — M7711 Lateral epicondylitis, right elbow: Secondary | ICD-10-CM | POA: Diagnosis present

## 2014-02-15 DIAGNOSIS — M25521 Pain in right elbow: Secondary | ICD-10-CM | POA: Diagnosis not present

## 2014-02-17 ENCOUNTER — Ambulatory Visit: Payer: BLUE CROSS/BLUE SHIELD | Admitting: Physical Therapy

## 2014-02-22 ENCOUNTER — Ambulatory Visit: Payer: BLUE CROSS/BLUE SHIELD | Admitting: Rehabilitation

## 2014-02-22 DIAGNOSIS — M7711 Lateral epicondylitis, right elbow: Secondary | ICD-10-CM | POA: Diagnosis not present

## 2014-02-24 ENCOUNTER — Ambulatory Visit: Payer: BLUE CROSS/BLUE SHIELD | Admitting: Physical Therapy

## 2014-02-24 DIAGNOSIS — M7711 Lateral epicondylitis, right elbow: Secondary | ICD-10-CM | POA: Diagnosis not present

## 2014-03-01 ENCOUNTER — Ambulatory Visit: Payer: BLUE CROSS/BLUE SHIELD | Admitting: Physical Therapy

## 2014-03-03 ENCOUNTER — Encounter: Payer: Self-pay | Admitting: Physical Therapy

## 2014-03-03 ENCOUNTER — Ambulatory Visit: Payer: BLUE CROSS/BLUE SHIELD | Admitting: Physical Therapy

## 2014-03-03 DIAGNOSIS — M7711 Lateral epicondylitis, right elbow: Secondary | ICD-10-CM

## 2014-03-03 DIAGNOSIS — M25531 Pain in right wrist: Secondary | ICD-10-CM

## 2014-03-03 DIAGNOSIS — M25521 Pain in right elbow: Secondary | ICD-10-CM

## 2014-03-03 NOTE — Therapy (Signed)
[facility] [address]  [place], [place], [address] Phone: [phone]   Fax:  [phone]  Physical Therapy Treatment  Patient Details  Name: Donna Anderson MRN: [id number] Date of Birth: 12-23-1960 Referring Provider:  Dene Gentry, MD  Encounter Date: 03/03/2014      PT End of Session - 03/03/14 1556    Visit Number 6   Number of Visits 12   Date for PT Re-Evaluation 03/22/14   PT Start Time 1455   PT Stop Time 1545   PT Time Calculation (min) 50 min      Past Medical History  Diagnosis Date  . Arthritis   . History of chicken pox   . Depression   . Diverticulosis 2015    CT SCAN   . Hyperlipidemia   . Kidney stones   . History of migraine   . Graves disease 1997  . Hearing loss     Past Surgical History  Procedure Laterality Date  . Appendectomy  2001  . Oophorectomy Right 2001    due to large ovarian cyst  . Hip fracture surgery Right 2009    fell playing with dog  . Labrum repair Right 2011  . Thyroidectomy  1997  . Endometrial ablation  2003    There were no vitals taken for this visit.  Visit Diagnosis:  Pain in joint, forearm, right  Lateral epicondylitis, right  Right elbow pain      Subjective Assessment - 03/03/14 1456    Symptoms pt states mildly sore day following last treatment but then noted 2-3 days of significant pain reduction.  Then on Monday 03/01/14 she performed great deal of housework and noted increased pain in R forearm to 9/10.   Currently in Pain? Yes   Pain Score 4    Pain Location --  R lateral forearm and into elbow   Aggravating Factors  housework, painful upon waking in AM   Pain Relieving Factors stretch, dry needling, meds         TODAY'S TREATMENT: MANUAL THERAPY - Trigger Point Dry Needling to R forearm extensors with recreation of ache symptoms.  DFM and gentle cross friction massage to same area as well as extensor tendons following  needling. COMBO [place] - 20%, 0.5W/cm2, 3.3 MHz, 2cm        And Premod E-Stim - 10-20 Hz to R lateral forearm x 12'                        PT Long Term Goals - 03/03/14 1559    PT LONG TERM GOAL #1   Title pt independent with advanced HEP as necessary by 03/22/14   Status On-going   PT LONG TERM GOAL #2   Title correctly modify ADLs and computer work to incorporate correct wrist positioning to decrease extensor strain by 03/22/14   Status On-going   PT LONG TERM GOAL #3   Title decrease pain at the lateral elbow to intermittent only by 03/22/14   Status On-going   PT LONG TERM GOAL #4   Title scoop out cat litter without increased pain above 2/10 by 03/22/14   Status On-going   PT LONG TERM GOAL #5   Title perform a fist + wrist extension without pain by 03/22/14   Status On-going               Plan - 03/03/14 1557    Clinical  Impression Statement pt responded well to initial dry needling session but pain returned a few days later with performing housework.   Pt will benefit from skilled therapeutic intervention in order to improve on the following deficits Pain;Impaired UE functional use   Rehab Potential Good   PT Frequency 2x / week   PT Duration 6 weeks  POC beginning 02/08/14   PT Next Visit Plan Dry Needling and exercise as able   Consulted and Agree with Plan of Care Patient        Problem List Patient Active Problem List   Diagnosis Date Noted  . Lateral epicondylitis 10/22/2013  . Cubital tunnel syndrome on right 09/30/2013  . Carpal tunnel syndrome 07/30/2013  . Diarrhea 04/12/2013  . Acute mesenteric adenitis 03/31/2013  . Adnexal cyst 03/31/2013  . Allergic rhinitis 02/09/2013  . Swelling of finger joint of right hand 02/09/2013  . Hyperglycemia 12/04/2012  . Nephrolithiasis 12/01/2012  . Anxiety and depression 12/01/2012  . Unspecified hypothyroidism 12/01/2012    Lizvet Chunn PT, OCS 03/03/2014, 4:09 PM  [facility] [address]  [place], [place], [address] Phone: [phone]   Fax:  [phone]

## 2014-03-05 ENCOUNTER — Encounter: Payer: Self-pay | Admitting: Family Medicine

## 2014-03-08 ENCOUNTER — Ambulatory Visit: Payer: BLUE CROSS/BLUE SHIELD | Admitting: Physical Therapy

## 2014-03-08 ENCOUNTER — Ambulatory Visit (INDEPENDENT_AMBULATORY_CARE_PROVIDER_SITE_OTHER): Payer: BLUE CROSS/BLUE SHIELD | Admitting: Family Medicine

## 2014-03-08 ENCOUNTER — Encounter: Payer: Self-pay | Admitting: Family Medicine

## 2014-03-08 ENCOUNTER — Encounter: Payer: Self-pay | Admitting: Physical Therapy

## 2014-03-08 VITALS — BP 125/86 | HR 71 | Ht 63.0 in | Wt 140.0 lb

## 2014-03-08 DIAGNOSIS — M542 Cervicalgia: Secondary | ICD-10-CM

## 2014-03-08 DIAGNOSIS — M7711 Lateral epicondylitis, right elbow: Secondary | ICD-10-CM | POA: Diagnosis not present

## 2014-03-08 DIAGNOSIS — M5010 Cervical disc disorder with radiculopathy, unspecified cervical region: Secondary | ICD-10-CM

## 2014-03-08 DIAGNOSIS — M25521 Pain in right elbow: Secondary | ICD-10-CM

## 2014-03-08 MED ORDER — PREDNISONE (PAK) 10 MG PO TABS: ORAL_TABLET | ORAL | Status: DC

## 2014-03-08 MED ORDER — TRAMADOL HCL 50 MG PO TABS: 50.0000 mg | ORAL_TABLET | Freq: Four times a day (QID) | ORAL | Status: DC | PRN

## 2014-03-08 NOTE — Therapy (Signed)
[facility] [address]  [place], [place], [address] Phone: [phone]   Fax:  [phone]  Physical Therapy Treatment  Patient Details  Name: Donna Anderson MRN: [id number] Date of Birth: 05/29/1960 Referring Provider:  Dene Gentry, MD  Encounter Date: 03/08/2014      PT End of Session - 03/08/14 1033    Visit Number 7   Number of Visits 12   Date for PT Re-Evaluation 03/22/14   PT Start Time 0855   PT Stop Time 0930   PT Time Calculation (min) 35 min      Past Medical History  Diagnosis Date  . Arthritis   . History of chicken pox   . Depression   . Diverticulosis 2015    CT SCAN   . Hyperlipidemia   . Kidney stones   . History of migraine   . Graves disease 1997  . Hearing loss     Past Surgical History  Procedure Laterality Date  . Appendectomy  2001  . Oophorectomy Right 2001    due to large ovarian cyst  . Hip fracture surgery Right 2009    fell playing with dog  . Labrum repair Right 2011  . Thyroidectomy  1997  . Endometrial ablation  2003    There were no vitals taken for this visit.  Visit Diagnosis:  Elbow pain, right  Neck pain      Subjective Assessment - 03/08/14 0856    Symptoms pt states she has noted increase in R neck and R forearm in the past 4 days for unknown reason.  States she sees Dr. Barbaraann Barthel later today about neck.  States pain was up to 10/10 over the weekend in both areas.  States noted pain in neck and forearm while at rest along with N/T into R digits #4-5.   Currently in Pain? Yes   Pain Score 7    Pain Location Elbow   Pain Orientation Right   Multiple Pain Sites Yes   Pain Score 10   Pain Location Neck   Pain Orientation Right                    [facility] Adult PT Treatment/Exercise - 03/08/14 0001    Modalities   Modalities Ultrasound   Ultrasound   Ultrasound Location R lat forearm   Ultrasound Parameters 2cm, 20%, 0.5w/cm2, 3.3  MHz, 8'   Manual Therapy   Manual Therapy Myofascial release;Manual Traction   Myofascial Release STM/TPR R Upper Trap along with stretching to same   Manual Traction c-spine due to c/o 10/10 pain.  States noted great improvement with manual.                     PT Long Term Goals - 03/08/14 1035    PT LONG TERM GOAL #1   Title pt independent with advanced HEP as necessary by 03/22/14   Status On-going   PT LONG TERM GOAL #2   Title correctly modify ADLs and computer work to incorporate correct wrist positioning to decrease extensor strain by 03/22/14   Status On-going   PT LONG TERM GOAL #3   Title decrease pain at the lateral elbow to intermittent only by 03/22/14   Status On-going   PT LONG TERM GOAL #4   Title scoop out cat litter without increased pain above 2/10 by 03/22/14   Status On-going  Plan - 03/08/14 1033    Clinical Impression Statement pt with increased neck and forearm pain lately along with radicular symptoms into R UE.  Seems neck is likely contributing to lateral forearms pain.  Noted sig TTP along radial nerve throughout triceps and forearm.  She noted sig improvement with manual to c-spine.  Will perform c-spine eval and begin regular treatment to this region following her MD visit later today if MD agrees with PT for neck pain.        Problem List Patient Active Problem List   Diagnosis Date Noted  . Lateral epicondylitis 10/22/2013  . Cubital tunnel syndrome on right 09/30/2013  . Carpal tunnel syndrome 07/30/2013  . Diarrhea 04/12/2013  . Acute mesenteric adenitis 03/31/2013  . Adnexal cyst 03/31/2013  . Allergic rhinitis 02/09/2013  . Swelling of finger joint of right hand 02/09/2013  . Hyperglycemia 12/04/2012  . Nephrolithiasis 12/01/2012  . Anxiety and depression 12/01/2012  . Unspecified hypothyroidism 12/01/2012    Sendy Pluta PT, OCS 03/08/2014, 10:39 AM  [facility] [address]  [place], [place], [address] Phone: [phone]   Fax:  [phone]

## 2014-03-08 NOTE — Patient Instructions (Signed)
You have cervical radiculopathy (a pinched nerve in the neck). Prednisone 6 day dose pack to relieve irritation/inflammation of the nerve. Aleve 2 tabs twice a day with food for pain and inflammation - start day AFTER finishing prednisone if prescribed this. Consider muscle relaxant for muscle spasms (can make you sleepy). Tramadol for severe pain (no driving on this medicine). Consider cervical collar if severely painful. Simple range of motion exercises within limits of pain to prevent further stiffness. Start physical therapy for stretching, exercises, traction, and modalities. Heat 15 minutes at a time 3-4 times a day to help with spasms. Watch head position when on computers, texting, when sleeping in bed - should in line with back to prevent further nerve traction and irritation. Consider home traction unit if you get benefit with this in physical therapy. If not improving we will consider an MRI. Call me in a week to let me know how you're doing.

## 2014-03-09 DIAGNOSIS — M542 Cervicalgia: Secondary | ICD-10-CM | POA: Insufficient documentation

## 2014-03-09 NOTE — Assessment & Plan Note (Signed)
concerning for cervical radiculopathy which could explain why lateral epicondylitis is not improving (referred from neck).  Trial prednisone dose pack with transition to aleve.  Tramadol for severe pain.  Start PT for this.  Heat for spasms.  Discussed ergonomic issues.  Call me in a week for an update on her status - consider MRI if not improving.

## 2014-03-09 NOTE — Progress Notes (Signed)
PCP: Nance Pear., NP  Subjective:   HPI: Patient is a 54 y.o. female here for neck pain.  Patient continues to struggle with right elbow pain but primary issue is now right sided neck pain. Has been bothering her for 3 months but really bad past week. Massage and stretching helped today in PT. Seemed to wake up with this pain. No numbness/tingling. Does not radiates though, again, she has pain in elbow down forearm from dx epicondylitis.  Past Medical History  Diagnosis Date  . Arthritis   . History of chicken pox   . Depression   . Diverticulosis 2015    CT SCAN   . Hyperlipidemia   . Kidney stones   . History of migraine   . Graves disease 1997  . Hearing loss     Current Outpatient Prescriptions on File Prior to Visit  Medication Sig Dispense Refill  . levothyroxine (SYNTHROID, LEVOTHROID) 112 MCG tablet TAKE 1 TABLET DAILY BEFORE BREAKFAST 90 tablet 0   No current facility-administered medications on file prior to visit.    Past Surgical History  Procedure Laterality Date  . Appendectomy  2001  . Oophorectomy Right 2001    due to large ovarian cyst  . Hip fracture surgery Right 2009    fell playing with dog  . Labrum repair Right 2011  . Thyroidectomy  1997  . Endometrial ablation  2003    No Known Allergies  History   Social History  . Marital Status: Married    Spouse Name: N/A  . Number of Children: N/A  . Years of Education: N/A   Occupational History  . Not on file.   Social History Main Topics  . Smoking status: Former Smoker -- 15 years    Quit date: 01/16/1999  . Smokeless tobacco: Never Used  . Alcohol Use: 0.0 oz/week    0 Standard drinks or equivalent per week     Comment: occasional  . Drug Use: No  . Sexual Activity: Yes    Birth Control/ Protection: None   Other Topics Concern  . Not on file   Social History Narrative   Married (second marriage) has a 69 yr old son in [place]   Worked in Psych[employer], educational   Enjoys  exercising, hiking, kayaking.    Completed 12th grade   1 dog, 1 cat          Family History  Problem Relation Age of Onset  . Hyperlipidemia Father   . Hypertension Father   . Heart failure Father 50  . Diabetes Sister   . Arthritis Paternal Grandmother   . Kidney disease Paternal Grandmother   . Diabetes Paternal Grandmother   . Arthritis Paternal Grandfather     BP 125/86 mmHg  Pulse 71  Ht 5\' 3"  (1.6 m)  Wt 140 lb (63.504 kg)  BMI 24.81 kg/m2  Review of Systems: See HPI above.    Objective:  Physical Exam:  Gen: NAD  Neck: No gross deformity, swelling, bruising. TTP right cervical paraspinal region and lateral epicondyle, forearm.  No midline/bony TTP. FROM neck - pain on flexion and right lateral rotation. BUE strength 5/5.   Sensation intact to light touch.   2+ equal reflexes in biceps, trace triceps and brachioradialis tendons. Negative spurlings. NV intact distal BUEs.    Assessment & Plan:  1. Neck pain - concerning for cervical radiculopathy which could explain why lateral epicondylitis is not improving (referred from neck).  Trial prednisone dose pack with transition to  aleve.  Tramadol for severe pain.  Start PT for this.  Heat for spasms.  Discussed ergonomic issues.  Call me in a week for an update on her status - consider MRI if not improving.

## 2014-03-10 ENCOUNTER — Ambulatory Visit: Payer: BLUE CROSS/BLUE SHIELD | Admitting: Physical Therapy

## 2014-03-10 ENCOUNTER — Encounter: Payer: Self-pay | Admitting: Physical Therapy

## 2014-03-10 DIAGNOSIS — M7711 Lateral epicondylitis, right elbow: Secondary | ICD-10-CM | POA: Diagnosis not present

## 2014-03-10 DIAGNOSIS — M542 Cervicalgia: Secondary | ICD-10-CM

## 2014-03-10 NOTE — Patient Instructions (Signed)
Instructed in corner pec stretch with chin tuck 3x20" 3x/day

## 2014-03-10 NOTE — Therapy (Signed)
[facility] [address]  [place], [place], [address] Phone: [phone]   Fax:  [phone]  Physical Therapy Treatment  Patient Details  Name: Donna Anderson MRN: [id number] Date of Birth: 07-20-1960 Referring Provider:  Dene Gentry, MD  Encounter Date: 03/10/2014      PT End of Session - 03/10/14 0935    Visit Number 8   Number of Visits 16   Date for PT Re-Evaluation 04/07/14   PT Start Time 0925   PT Stop Time 1941   PT Time Calculation (min) 80 min      Past Medical History  Diagnosis Date  . Arthritis   . History of chicken pox   . Depression   . Diverticulosis 2015    CT SCAN   . Hyperlipidemia   . Kidney stones   . History of migraine   . Graves disease 1997  . Hearing loss     Past Surgical History  Procedure Laterality Date  . Appendectomy  2001  . Oophorectomy Right 2001    due to large ovarian cyst  . Hip fracture surgery Right 2009    fell playing with dog  . Labrum repair Right 2011  . Thyroidectomy  1997  . Endometrial ablation  2003    There were no vitals taken for this visit.  Visit Diagnosis:  Neck pain on right side - Plan: PT plan of care cert/re-cert      Subjective Assessment - 03/10/14 0928    Symptoms Pt states received prednisone from MD two days ago and this seems to be helping with neck pain.  She currently rates R neck pain 6-7/10 and R forearm pain 6/10.  Pt here today for c-spine assessment in oder to add this to PT POC.   Patient Stated Goals resume normal life without limitation by pain   Currently in Pain? Yes   Pain Score 6    Pain Location Elbow   Pain Orientation Right   Aggravating Factors  housework   Pain Relieving Factors stretch, meds   Multiple Pain Sites Yes   Pain Score --  6-7/10   Pain Location Neck   Pain Orientation Right   Pain Frequency Constant  pain decreases with heat and medication; increased pain with neck rotation  (driving) and with computer/ipad use.          [facility] PT Assessment - 03/10/14 0001    ROM / Strength   AROM / PROM / Strength AROM;Strength   AROM   AROM Assessment Site Cervical   Cervical Flexion WNL   Cervical Extension 50  R Lower Neck pain   Cervical - Right Side Bend 34  no pain   Cervical - Left Side Bend 33  no pain   Cervical - Right Rotation 62  R lower neck pain   Cervical - Left Rotation 70  no pain   Strength   Overall Strength Comments B UE 5/5 grossly other than R Wrist Ext 4/5 (lateral elbow pain), R Shoulder ABD 4/5 (pain throughout R lateral upper arm and into R neck), R Shoulder ER 4/5 (R lateral arm and forearm pain)   Special Tests    Special Tests Cervical   Cervical Tests Dictraction;other;other2   Distraction Test   Findngs Positive   Comment relief of pain   other    Findings Positive   Side Right   Comment --  Quadrant testing   other  Findings Positive   Side Right   Comment ULTT with both radial and ulnar nerve biase creating pain in lateral arm and forearm         TODAY'S TREATMENT: Re-Evaluation adding Neck Pain to POC due to likely contributing to R UE pain. Manual x 25': STM R C-spine paraspinals with TPR in area of C6 due to area of increased density and tissue sensitivity.  STM with Stretch R UT and Scalenes.  c-spine distraction with side glide and rotation mobes grade 3.  R UE nerve glides.  Mechanical Traction: c-spine, 20dg pull, 10#/5#, 60"/20", x 15' HEP Instruction               PT Education - 03/10/14 1020    Education provided Yes   Education Details HEP Update   Person(s) Educated Patient   Methods Explanation;Demonstration   Comprehension Returned demonstration;Verbalized understanding          PT Short Term Goals - 03/10/14 1032    PT SHORT TERM GOAL #1   Title pt reports decrease in neck pain to 4/10 on average by 03/22/14   Status New           PT Long Term Goals - 03/10/14 1033    PT  LONG TERM GOAL #1   Title pt independent with advanced HEP as necessary by 04/07/14   Status On-going   PT LONG TERM GOAL #2   Title correctly modify ADLs and computer work to incorporate correct wrist positioning to decrease extensor strain by 04/07/14   PT LONG TERM GOAL #3   Title decrease pain at the lateral elbow to intermittent only by 04/07/14   PT LONG TERM GOAL #4   Title scoop out cat litter without increased pain above 2/10 by 04/07/14   PT LONG TERM GOAL #5   Title perform a fist + wrist extension without pain by 04/07/14   Status On-going   Additional Long Term Goals   Additional Long Term Goals Yes   PT LONG TERM GOAL #6   Title pt reports neck pain no longer interfers with ADLs and worst pain no greater than 3/10 by 04/07/14   Status New               Plan - 03/10/14 1021    Clinical Impression Statement pt with R neck pain along with R UE pain and intermittent N/T into R UE in C6 distribution.  Seems as least a component of pt's c/o lateral forearm pain may be related to neck nerve impingment as I was able to reproduce forearm pain with radial nerve tension testing today.   Pt will benefit from skilled therapeutic intervention in order to improve on the following deficits Decreased range of motion;Improper body mechanics;Postural dysfunction;Pain;Impaired UE functional use;Decreased strength   Rehab Potential Good   PT Frequency 2x / week   PT Duration 4 weeks   PT Treatment/Interventions Moist Heat;Therapeutic activities;Patient/family education;Therapeutic exercise;Traction;Ultrasound;Manual techniques;Dry needling;Neuromuscular re-education;Cryotherapy;Electrical Stimulation   PT Next Visit Plan assess benefit of today's treatment, progress scapular stability training as able, may try dry needling to neck and/or kinesiology taping to neck.   Consulted and Agree with Plan of Care Patient        Problem List Patient Active Problem List   Diagnosis Date Noted  .  Neck pain 03/09/2014  . Lateral epicondylitis 10/22/2013  . Cubital tunnel syndrome on right 09/30/2013  . Carpal tunnel syndrome 07/30/2013  . Diarrhea 04/12/2013  . Acute mesenteric adenitis  03/31/2013  . Adnexal cyst 03/31/2013  . Allergic rhinitis 02/09/2013  . Swelling of finger joint of right hand 02/09/2013  . Hyperglycemia 12/04/2012  . Nephrolithiasis 12/01/2012  . Anxiety and depression 12/01/2012  . Unspecified hypothyroidism 12/01/2012    Yousra Ivens PT, OCS 03/10/2014, 10:51 AM  [facility] [address]  [place], [place], [address] Phone: [phone]   Fax:  [phone]

## 2014-03-15 ENCOUNTER — Ambulatory Visit: Payer: BLUE CROSS/BLUE SHIELD | Admitting: Physical Therapy

## 2014-03-15 ENCOUNTER — Encounter: Payer: Self-pay | Admitting: Physical Therapy

## 2014-03-15 DIAGNOSIS — M436 Torticollis: Secondary | ICD-10-CM

## 2014-03-15 DIAGNOSIS — M7711 Lateral epicondylitis, right elbow: Secondary | ICD-10-CM | POA: Diagnosis not present

## 2014-03-15 DIAGNOSIS — M542 Cervicalgia: Secondary | ICD-10-CM

## 2014-03-15 NOTE — Therapy (Signed)
[facility] [address]  [place], [place], [address] Phone: [phone]   Fax:  [phone]  Physical Therapy Treatment  Patient Details  Name: Donna Anderson MRN: [id number] Date of Birth: [birth year] Referring Provider:  Dene Gentry, MD  Encounter Date: 03/15/2014      PT End of Session - 03/15/14 0854    Visit Number 9   Number of Visits 16   Date for PT Re-Evaluation 04/07/14   PT Start Time 0848   PT Stop Time 0950   PT Time Calculation (min) 62 min      Past Medical History  Diagnosis Date  . Arthritis   . History of chicken pox   . Depression   . Diverticulosis 2015    CT SCAN   . Hyperlipidemia   . Kidney stones   . History of migraine   . Graves disease 1997  . Hearing loss     Past Surgical History  Procedure Laterality Date  . Appendectomy  2001  . Oophorectomy Right 2001    due to large ovarian cyst  . Hip fracture surgery Right 2009    fell playing with dog  . Labrum repair Right 2011  . Thyroidectomy  1997  . Endometrial ablation  2003    There were no vitals taken for this visit.  Visit Diagnosis:  Neck pain on right side  Neck stiffness      Subjective Assessment - 03/15/14 0850    Symptoms pt states she felt relief for 2 days following last treatment,  pain began to increase on 3rd day, and was intense by 4th day (yesterday) at which time she c/o 8/10 pain to R neck and R arm.   Currently in Pain? Yes   Pain Score 7    Pain Location Neck   Pain Orientation Right   Pain Score 5   Pain Location Elbow   Pain Orientation Right            TODAY'S TREATMENT: Pre Treatment C-spine AROM R/L Rotation 68/62 UBE lvl 1.0 1'/1' Manual x 35': c-spine distaction with B Rotation mobes and B side glide mobes grade 3 then isolated R SB and R Rotation mobes to mid c-spine.  STM and TPR to R UT (very tender with large area of increased tissue density).  R UE nerve glides  to all three (much less irritable versus last week with nerve glides). Post Manual C-spine AROM R/L Rotation 68/68 and pain down to 4/10 (was 7/10 at start)  Mechanical Traction: c-spine, 20dg pull, 10#/5#, 60"/20", x 15'                PT Short Term Goals - 03/15/14 0943    PT SHORT TERM GOAL #1   Title pt reports decrease in neck pain to 4/10 on average by 03/22/14   Status On-going           PT Long Term Goals - 03/15/14 0943    PT LONG TERM GOAL #1   Title pt independent with advanced HEP as necessary by 04/07/14   Status On-going   PT LONG TERM GOAL #2   Title correctly modify ADLs and computer work to incorporate correct wrist positioning to decrease extensor strain by 04/07/14   Status On-going   PT LONG TERM GOAL #3   Title decrease pain at the lateral elbow to intermittent only by 04/07/14   Status On-going   PT LONG TERM  GOAL #4   Title scoop out cat litter without increased pain above 2/10 by 04/07/14   Status On-going   PT LONG TERM GOAL #5   Title perform a fist + wrist extension without pain by 04/07/14   Status On-going   PT LONG TERM GOAL #6   Title pt reports neck pain no longer interfers with ADLs and worst pain no greater than 3/10 by 04/07/14   Status On-going              Plan - 03/15/14 0938    Clinical Impression Statement tolerated manual well with reduction in neck pain along with improved rotation AROM.  Very TTP R UT with some improvement with TPR noted.  No change in R elbow pain with c-spine manual or with UE nerve glides.  Last week nerve glides with radial nerve bias created R UE pain but not so today.   PT Next Visit Plan try dry needling to R UT if still in pain there; treat elbow with manual, [place], needling PRN; add foam roll exercises if able along with chest stretch   Consulted and Agree with Plan of Care Patient        Problem List Patient Active Problem List   Diagnosis Date Noted  . Neck pain 03/09/2014  . Lateral  epicondylitis 10/22/2013  . Cubital tunnel syndrome on right 09/30/2013  . Carpal tunnel syndrome 07/30/2013  . Diarrhea 04/12/2013  . Acute mesenteric adenitis 03/31/2013  . Adnexal cyst 03/31/2013  . Allergic rhinitis 02/09/2013  . Swelling of finger joint of right hand 02/09/2013  . Hyperglycemia 12/04/2012  . Nephrolithiasis 12/01/2012  . Anxiety and depression 12/01/2012  . Unspecified hypothyroidism 12/01/2012    Kaelene Elliston PT, OCS 03/15/2014, 9:53 AM  [facility] [place], [place], [address] Phone: [phone]   Fax:  [phone]

## 2014-03-18 ENCOUNTER — Ambulatory Visit: Payer: BLUE CROSS/BLUE SHIELD | Attending: Family Medicine | Admitting: Physical Therapy

## 2014-03-18 ENCOUNTER — Encounter: Payer: Self-pay | Admitting: Physical Therapy

## 2014-03-18 DIAGNOSIS — M7711 Lateral epicondylitis, right elbow: Secondary | ICD-10-CM | POA: Insufficient documentation

## 2014-03-18 DIAGNOSIS — M25521 Pain in right elbow: Secondary | ICD-10-CM | POA: Insufficient documentation

## 2014-03-18 DIAGNOSIS — M542 Cervicalgia: Secondary | ICD-10-CM

## 2014-03-18 NOTE — Therapy (Signed)
[facility] [address]  [place], [place], [address] Phone: [phone]   Fax:  [phone]  Physical Therapy Treatment  Patient Details  Name: Donna Anderson MRN: [id number] Date of Birth: 05/11/1960 Referring Provider:  Dene Gentry, MD  Encounter Date: 03/18/2014      PT End of Session - 03/18/14 0857    Visit Number 10   Number of Visits 16   Date for PT Re-Evaluation 04/07/14   PT Start Time 4196   PT Stop Time 0950   PT Time Calculation (min) 63 min      Past Medical History  Diagnosis Date  . Arthritis   . History of chicken pox   . Depression   . Diverticulosis 2015    CT SCAN   . Hyperlipidemia   . Kidney stones   . History of migraine   . Graves disease 1997  . Hearing loss     Past Surgical History  Procedure Laterality Date  . Appendectomy  2001  . Oophorectomy Right 2001    due to large ovarian cyst  . Hip fracture surgery Right 2009    fell playing with dog  . Labrum repair Right 2011  . Thyroidectomy  1997  . Endometrial ablation  2003    There were no vitals taken for this visit.  Visit Diagnosis:  Neck pain on right side  Right elbow pain      Subjective Assessment - 03/18/14 0852    Symptoms pt noted some benefit for 1-2 days after last treatment but again noted increased pain on 3rd day (yesterday).  States pain began to increase yesterday evening.  Prior to pain onset she was watching TV and had used computer more than typical earlier in the day.  Had difficulty sleeping due to pain.  Rates pain 8/10 yesterday and today.   Patient Stated Goals resume normal life without limitation by pain   Currently in Pain? Yes   Pain Score 8    Pain Location --  R neck/upper scapula and R lateral forearm           TODAY'S TREATMENT [place] / Premod E-stim Combo (1.4W/cm2, 1MHz, 100%, 5cm head) / (10-20Hz ) x 12' R Upper Trap after dry needling (see below)  MANUAL THERAPY -  STM and Stretch to R UT, C-spine manual traction with focus on R>L, R side glide and L SB mobes grade 3 and progressed to grade 4.  Pt painfree by end of treatment and states feels "normal" again.  Kinesiology tape to c-spine (4 strips)             Trigger Point Dry Needling - 03/18/14 1004    Consent Given? Yes   Muscles Treated Upper Body Upper trapezius  difficult to get to medial aspect of UT trigger point   Upper Trapezius Response --  no response but nearly painfree after treatment                PT Short Term Goals - 03/15/14 0943    PT SHORT TERM GOAL #1   Title pt reports decrease in neck pain to 4/10 on average by 03/22/14   Status On-going           PT Long Term Goals - 03/15/14 0943    PT LONG TERM GOAL #1   Title pt independent with advanced HEP as necessary by 04/07/14   Status On-going   PT LONG TERM GOAL #  2   Title correctly modify ADLs and computer work to incorporate correct wrist positioning to decrease extensor strain by 04/07/14   Status On-going   PT LONG TERM GOAL #3   Title decrease pain at the lateral elbow to intermittent only by 04/07/14   Status On-going   PT LONG TERM GOAL #4   Title scoop out cat litter without increased pain above 2/10 by 04/07/14   Status On-going   PT LONG TERM GOAL #5   Title perform a fist + wrist extension without pain by 04/07/14   Status On-going   PT LONG TERM GOAL #6   Title pt reports neck pain no longer interfers with ADLs and worst pain no greater than 3/10 by 04/07/14   Status On-going               Plan - 03/18/14 0959    Clinical Impression Statement pt with c/o 8/10 past 2 days but able to abolish pain with manual and US/estim combo.  Seems pain increases are realated to computer use and also seems R lateral forearm pain is related to neck in that both pains come and go at same time and there is tenderness to entire distibution of R radial nerve.  However, unable to reproduce pain with ULTT  to R UE but able to abolish pain with work to c-spine area.   Pt will benefit from skilled therapeutic intervention in order to improve on the following deficits Decreased range of motion;Improper body mechanics;Postural dysfunction;Pain;Impaired UE functional use;Decreased strength   Rehab Potential Good   PT Treatment/Interventions Moist Heat;Therapeutic activities;Patient/family education;Therapeutic exercise;Traction;Ultrasound;Manual techniques;Dry needling;Neuromuscular re-education;Cryotherapy;Electrical Stimulation   PT Next Visit Plan assess benefit of tape and needling, continue modalities and manual prn for pain but want to progress to exercise on foal roll as soon as able   Consulted and Agree with Plan of Care Patient        Problem List Patient Active Problem List   Diagnosis Date Noted  . Neck pain 03/09/2014  . Lateral epicondylitis 10/22/2013  . Cubital tunnel syndrome on right 09/30/2013  . Carpal tunnel syndrome 07/30/2013  . Diarrhea 04/12/2013  . Acute mesenteric adenitis 03/31/2013  . Adnexal cyst 03/31/2013  . Allergic rhinitis 02/09/2013  . Swelling of finger joint of right hand 02/09/2013  . Hyperglycemia 12/04/2012  . Nephrolithiasis 12/01/2012  . Anxiety and depression 12/01/2012  . Unspecified hypothyroidism 12/01/2012    Migel Hannis PT, OCS 03/18/2014, 10:10 AM  [facility] [address]  [place], [place], [address] Phone: [phone]   Fax:  [phone]

## 2014-03-19 ENCOUNTER — Encounter: Payer: Self-pay | Admitting: Family Medicine

## 2014-03-22 ENCOUNTER — Ambulatory Visit: Payer: BLUE CROSS/BLUE SHIELD | Admitting: Physical Therapy

## 2014-03-22 ENCOUNTER — Encounter: Payer: Self-pay | Admitting: Physical Therapy

## 2014-03-22 DIAGNOSIS — M436 Torticollis: Secondary | ICD-10-CM

## 2014-03-22 DIAGNOSIS — M542 Cervicalgia: Secondary | ICD-10-CM

## 2014-03-22 DIAGNOSIS — M25521 Pain in right elbow: Secondary | ICD-10-CM

## 2014-03-22 DIAGNOSIS — M7711 Lateral epicondylitis, right elbow: Secondary | ICD-10-CM | POA: Diagnosis not present

## 2014-03-22 NOTE — Patient Instructions (Signed)
HEP update: Supine c-spine AROM to tolerance to improve painfree ROM in gravity eliminated position. Seated/Standing Low Row with Black TB

## 2014-03-22 NOTE — Therapy (Signed)
[facility] [address]  [place], [place], [address] Phone: [phone]   Fax:  [phone]  Physical Therapy Treatment  Patient Details  Name: Donna Anderson MRN: [id number] Date of Birth: 1960-03-20 Referring Provider:  Dene Gentry, MD  Encounter Date: 03/22/2014      PT End of Session - 03/22/14 0722    Visit Number 11   Number of Visits 16   Date for PT Re-Evaluation 04/07/14   PT Start Time 0713   PT Stop Time 0828   PT Time Calculation (min) 75 min      Past Medical History  Diagnosis Date  . Arthritis   . History of chicken pox   . Depression   . Diverticulosis 2015    CT SCAN   . Hyperlipidemia   . Kidney stones   . History of migraine   . Graves disease 1997  . Hearing loss     Past Surgical History  Procedure Laterality Date  . Appendectomy  2001  . Oophorectomy Right 2001    due to large ovarian cyst  . Hip fracture surgery Right 2009    fell playing with dog  . Labrum repair Right 2011  . Thyroidectomy  1997  . Endometrial ablation  2003    There were no vitals taken for this visit.  Visit Diagnosis:  Neck pain on right side  Right elbow pain  Neck stiffness      Subjective Assessment - 03/22/14 0714    Symptoms Pt states 10/10 today but also states has not been taking tramadol because "I'm not a medication person".  States, "I'm really, really fraustrated".  Don't know why increased pain, "sitting on the couch doing absolutely nothing".  States everything makes her worse.   Currently in Pain? Yes   Pain Score 10-Worst pain ever   Pain Location --  R neck and R lateral forearm.   Aggravating Factors  increased neck pain with neck ROM (Flex/Ext mostly), forearm increased pain with elbow ROM.   Pain Relieving Factors nothing   Multiple Pain Sites No          TODAY'S TREATMENT MANUAL THERAPY - Grade 3 and into grade 4 c-spine retraction and R 1st rib, Grade 3  upper c-spine mobes into R rotation and contract/relax into R rotation, R UE nerve glides (performed while pt on mechanical traction).  [place] / Premod E-stim Combo (0.5 W/cm2, 3.3MHz, 20%, 2cm head) / (10-20Hz ) x 12' R lateral forearm and lateral elbow.  TherEx - standing low row and c-spine AROM HEP instruct and perform (see below) Kinesiology tape to R elbow (3 strips)  Mechanical Traction - c-spine, 20 dg pull, 60"/20", 10#/5#, 15'  Pt reports pain decrease to approx 2/10 with ROM following treatment                  PT Education - 03/22/14 0830    Education provided Yes   Education Details HEP Update   Person(s) Educated Patient   Methods Demonstration;Explanation   Comprehension Verbalized understanding;Returned demonstration          PT Short Term Goals - 03/22/14 0837    PT SHORT TERM GOAL #1   Title pt reports decrease in neck pain to 4/10 on average by 03/22/14   Status On-going           PT Long Term Goals - 03/22/14 0837    PT LONG TERM GOAL #1  Title pt independent with advanced HEP as necessary by 04/07/14   Status On-going   PT LONG TERM GOAL #2   Title correctly modify ADLs and computer work to incorporate correct wrist positioning to decrease extensor strain by 04/07/14   Status On-going   PT LONG TERM GOAL #3   Title decrease pain at the lateral elbow to intermittent only by 04/07/14   Status On-going   PT LONG TERM GOAL #4   Title scoop out cat litter without increased pain above 2/10 by 04/07/14   Status On-going   PT LONG TERM GOAL #5   Title perform a fist + wrist extension without pain by 04/07/14   Status On-going               Plan - 03/22/14 9983    Clinical Impression Statement pt with c/o severe pain over the weekend.  States R lateral arm and R c-spine pain both 9-10/10 lately.  States has not been doing anything so does not understand why in so much pain.  Explained to pt importance of movement to limit pain and advisded her  to perform c-spine AROM in supine to limit/prevent pain while moving neck.  Signs/symptoms of neck pain still seem facet in nature (pain with extension and compression to R facets).  R elbow pain seems at least in part due to neck but also an overuse injury aspect likely as well.  However, forearm pain has been increasing lately without pt using R UE and forearm pain has been increasing/decreasing at same time neck pain changes.   Pt will benefit from skilled therapeutic intervention in order to improve on the following deficits Decreased range of motion;Improper body mechanics;Postural dysfunction;Pain;Impaired UE functional use;Decreased strength   Rehab Potential Good   PT Treatment/Interventions Moist Heat;Therapeutic activities;Patient/family education;Therapeutic exercise;Traction;Ultrasound;Manual techniques;Dry needling;Neuromuscular re-education;Cryotherapy;Electrical Stimulation   PT Next Visit Plan re-assess and treat as appropriate   Consulted and Agree with Plan of Care Patient        Problem List Patient Active Problem List   Diagnosis Date Noted  . Neck pain 03/09/2014  . Lateral epicondylitis 10/22/2013  . Cubital tunnel syndrome on right 09/30/2013  . Carpal tunnel syndrome 07/30/2013  . Diarrhea 04/12/2013  . Acute mesenteric adenitis 03/31/2013  . Adnexal cyst 03/31/2013  . Allergic rhinitis 02/09/2013  . Swelling of finger joint of right hand 02/09/2013  . Hyperglycemia 12/04/2012  . Nephrolithiasis 12/01/2012  . Anxiety and depression 12/01/2012  . Unspecified hypothyroidism 12/01/2012    Devony Mcgrady PT, OCS 03/22/2014, 8:38 AM  [facility] [address]  [place], [place], [address] Phone: [phone]   Fax:  [phone]

## 2014-03-25 ENCOUNTER — Ambulatory Visit: Payer: BLUE CROSS/BLUE SHIELD | Admitting: Physical Therapy

## 2014-03-25 ENCOUNTER — Encounter: Payer: Self-pay | Admitting: Physical Therapy

## 2014-03-25 DIAGNOSIS — M7711 Lateral epicondylitis, right elbow: Secondary | ICD-10-CM | POA: Diagnosis not present

## 2014-03-25 DIAGNOSIS — M542 Cervicalgia: Secondary | ICD-10-CM

## 2014-03-25 DIAGNOSIS — M436 Torticollis: Secondary | ICD-10-CM

## 2014-03-25 DIAGNOSIS — M25521 Pain in right elbow: Secondary | ICD-10-CM

## 2014-03-25 NOTE — Therapy (Addendum)
[facility] [address]  [place], [place], [address] Phone: [phone]   Fax:  [phone]  Physical Therapy Treatment  Patient Details  Name: Donna Anderson MRN: [id number] Date of Birth: 09-11-1960 Referring Provider:  Dene Gentry, MD  Encounter Date: 03/25/2014      PT End of Session - 03/25/14 0725    Visit Number 12   Number of Visits 16   Date for PT Re-Evaluation 04/07/14   PT Start Time 0715   PT Stop Time 0815   PT Time Calculation (min) 60 min      Past Medical History  Diagnosis Date  . Arthritis   . History of chicken pox   . Depression   . Diverticulosis 2015    CT SCAN   . Hyperlipidemia   . Kidney stones   . History of migraine   . Graves disease 1997  . Hearing loss     Past Surgical History  Procedure Laterality Date  . Appendectomy  2001  . Oophorectomy Right 2001    due to large ovarian cyst  . Hip fracture surgery Right 2009    fell playing with dog  . Labrum repair Right 2011  . Thyroidectomy  1997  . Endometrial ablation  2003    There were no vitals taken for this visit.  Visit Diagnosis:  Neck pain on right side  Right elbow pain  Neck stiffness      Subjective Assessment - 03/25/14 0717    Symptoms pt c/o 8/10 pain this AM and states has been running 8/10 every morning since last treatment and 4/10 during the day.  She states neck and R arm pain seem to fluctuate together.  Pt does not know what icreases her pain.  States pain wakes her sometimes.   Currently in Pain? Yes   Pain Score 8    Pain Location --  R neck and R UE (mostly lateral forearm)   Multiple Pain Sites No          [facility] PT Assessment - 03/25/14 0001    ROM / Strength   AROM / PROM / Strength AROM   AROM   AROM Assessment Site Cervical  pt states she is typically stiffer in the AM   Cervical Flexion WNL   Cervical Extension 42  54 after manual   Cervical - Right Side Bend 25   30 after manual   Cervical - Left Side Bend 39  32 after manual   Cervical - Right Rotation 60  66 after manual   Cervical - Left Rotation 65  65 after manual       TODAY'S TREATMENT: Manual - supine contract/relax upper c-spine into R SB while in R rotation.  B side-glide and rotation mobes grade 3.  Seated R 1st rib caudal glides grade 3.  AROM improvements noted in c-spine following manual and reports pain 2/10. Manual to R elbow - DFM R forearm extensors and lateral origin, mobes with movement 2x10 R wrist Ext  Mechanical Traction - c-spine, 20dg pull, 12#/6#, 60"/20", 15' CP R Forearm 15' while on traction  Pt again states feels good following today's session with minimal to no pain.                    PT Short Term Goals - 03/25/14 9747    PT SHORT TERM GOAL #1   Title pt reports decrease in neck pain to  4/10 on average by 03/22/14   Status On-going           PT Long Term Goals - 03/25/14 0843    PT LONG TERM GOAL #1   Title pt independent with advanced HEP as necessary by 04/07/14   Status On-going   PT LONG TERM GOAL #2   Title correctly modify ADLs and computer work to incorporate correct wrist positioning to decrease extensor strain by 04/07/14   Status On-going   PT LONG TERM GOAL #3   Title decrease pain at the lateral elbow to intermittent only by 04/07/14   Status On-going   PT LONG TERM GOAL #4   Title scoop out cat litter without increased pain above 2/10 by 04/07/14   Status On-going   PT LONG TERM GOAL #5   Title perform a fist + wrist extension without pain by 04/07/14   Status On-going   PT LONG TERM GOAL #6   Title pt reports neck pain no longer interfers with ADLs and worst pain no greater than 3/10 by 04/07/14   Status On-going               Plan - 03/25/14 0837    Clinical Impression Statement pt tolerates treatments well and reports significant decrease in pain during treatments typically to 2/10 range.  However, she  states pain returns in the same day as her treatments and denies noting much lasting benefit.  C-spine and R UE pain seem related to some extentent has noted in past treatments but there also seems to be issues at each location independent of one another.  C-spine seems facet in nature but really can't r/o disc issue.  Overall progress seems quite limited so beginning to wonder how much further PT can assist patient at this time.   Pt will benefit from skilled therapeutic intervention in order to improve on the following deficits Decreased range of motion;Improper body mechanics;Postural dysfunction;Pain;Impaired UE functional use;Decreased strength   Rehab Potential Fair   PT Treatment/Interventions Moist Heat;Therapeutic activities;Patient/family education;Therapeutic exercise;Traction;Ultrasound;Manual techniques;Dry needling;Neuromuscular re-education;Cryotherapy;Electrical Stimulation   PT Next Visit Plan re-assess and treat as appropriate   Consulted and Agree with Plan of Care Patient        Problem List Patient Active Problem List   Diagnosis Date Noted  . Neck pain 03/09/2014  . Lateral epicondylitis 10/22/2013  . Cubital tunnel syndrome on right 09/30/2013  . Carpal tunnel syndrome 07/30/2013  . Diarrhea 04/12/2013  . Acute mesenteric adenitis 03/31/2013  . Adnexal cyst 03/31/2013  . Allergic rhinitis 02/09/2013  . Swelling of finger joint of right hand 02/09/2013  . Hyperglycemia 12/04/2012  . Nephrolithiasis 12/01/2012  . Anxiety and depression 12/01/2012  . Unspecified hypothyroidism 12/01/2012    Royer Cristobal PT, OCS 03/25/2014, 8:48 AM  [facility] [address]  [place], [place], [address] Phone: [phone]   Fax:  [phone]    PHYSICAL THERAPY DISCHARGE SUMMARY  Visits from Start of Care: 12  Current functional level related to goals / functional outcomes: Limited by pain and LOM   Remaining  deficits: Neck pain, R UE pain, neck LOM   Education / Equipment: HEP and posture correction Plan: Patient agrees to discharge.  Patient goals were not met. Patient is being discharged due to lack of progress.  ?????       Mrs. Beckom last treated on 03/25/14.  She has not returned since that treatment and reviewing her chart I see  she underwent a c-spine injection recently.  Due to not seeing her in the past 6+ weeks I am discharging her from my care at this time.  Leonette Most PT, OCS 05/10/2014 2:44 PM

## 2014-04-07 ENCOUNTER — Ambulatory Visit (INDEPENDENT_AMBULATORY_CARE_PROVIDER_SITE_OTHER): Payer: BLUE CROSS/BLUE SHIELD | Admitting: Family Medicine

## 2014-04-07 ENCOUNTER — Encounter: Payer: Self-pay | Admitting: Family Medicine

## 2014-04-07 ENCOUNTER — Ambulatory Visit (HOSPITAL_BASED_OUTPATIENT_CLINIC_OR_DEPARTMENT_OTHER)
Admission: RE | Admit: 2014-04-07 | Discharge: 2014-04-07 | Disposition: A | Discharge status: Home / Self Care | Payer: BLUE CROSS/BLUE SHIELD | Source: Ambulatory Visit | Attending: Family Medicine | Admitting: Family Medicine

## 2014-04-07 VITALS — BP 144/87 | HR 67 | Ht 63.0 in | Wt 140.0 lb

## 2014-04-07 DIAGNOSIS — M542 Cervicalgia: Secondary | ICD-10-CM

## 2014-04-07 DIAGNOSIS — M25521 Pain in right elbow: Secondary | ICD-10-CM

## 2014-04-07 DIAGNOSIS — M79601 Pain in right arm: Secondary | ICD-10-CM | POA: Diagnosis not present

## 2014-04-07 DIAGNOSIS — M47892 Other spondylosis, cervical region: Secondary | ICD-10-CM | POA: Insufficient documentation

## 2014-04-07 MED ORDER — TRAMADOL HCL 50 MG PO TABS: 50.0000 mg | ORAL_TABLET | Freq: Four times a day (QID) | ORAL | Status: DC | PRN

## 2014-04-07 NOTE — Patient Instructions (Signed)
Get x-rays as you leave today. Take tramadol as needed. We will set up an MRI of your neck. I will call you with the results and next steps.

## 2014-04-12 NOTE — Assessment & Plan Note (Signed)
concerning for cervical radiculopathy which could explain why lateral epicondylitis is not improving (referred from neck).  Prednisone, tramadol, PT without benefit.  Advised we go ahead with x-rays and MRI of cervical spine to further assess for disc herniation and radiculopathy.

## 2014-04-12 NOTE — Progress Notes (Addendum)
PCP: Nance Pear., NP  Subjective:   HPI: Patient is a 54 y.o. female here for neck pain.  2/22: Patient continues to struggle with right elbow pain but primary issue is now right sided neck pain. Has been bothering her for 3 months but really bad past week. Massage and stretching helped today in PT. Seemed to wake up with this pain. No numbness/tingling. Does not radiates though, again, she has pain in elbow down forearm from dx epicondylitis.  3/23: Patient returns with persistent 9/10 pain in right side of neck and right elbow. Icing, using heat. Tingling now into 4th and 5th digits. Taking tramadol as needed. Doing physical therapy without benefit.   Past Medical History  Diagnosis Date  . Arthritis   . History of chicken pox   . Depression   . Diverticulosis 2015    CT SCAN   . Hyperlipidemia   . Kidney stones   . History of migraine   . Graves disease 1997  . Hearing loss     Current Outpatient Prescriptions on File Prior to Visit  Medication Sig Dispense Refill  . ibuprofen (ADVIL,MOTRIN) 200 MG tablet Take 200 mg by mouth every 6 (six) hours as needed.    Marland Kitchen levothyroxine (SYNTHROID, LEVOTHROID) 112 MCG tablet TAKE 1 TABLET DAILY BEFORE BREAKFAST 90 tablet 0  . predniSONE (STERAPRED UNI-PAK) 10 MG tablet 6 tabs po day 1, 5 tabs po day 2, 4 tabs po day 3, 3 tabs po day 4, 2 tabs po day 5, 1 tab po day 6 (Patient not taking: Reported on 03/15/2014) 21 tablet 0   No current facility-administered medications on file prior to visit.    Past Surgical History  Procedure Laterality Date  . Appendectomy  2001  . Oophorectomy Right 2001    due to large ovarian cyst  . Hip fracture surgery Right 2009    fell playing with dog  . Labrum repair Right 2011  . Thyroidectomy  1997  . Endometrial ablation  2003    No Known Allergies  History   Social History  . Marital Status: Married    Spouse Name: N/A  . Number of Children: N/A  . Years of Education:  N/A   Occupational History  . Not on file.   Social History Main Topics  . Smoking status: Former Smoker -- 15 years    Quit date: 01/16/1999  . Smokeless tobacco: Never Used  . Alcohol Use: 0.0 oz/week    0 Standard drinks or equivalent per week     Comment: occasional  . Drug Use: No  . Sexual Activity: Yes    Birth Control/ Protection: None   Other Topics Concern  . Not on file   Social History Narrative   Married (second marriage) has a 53 yr old son in [place]   Worked in [employer], educational   Enjoys exercising, hiking, kayaking.    Completed 12th grade   1 dog, 1 cat          Family History  Problem Relation Age of Onset  . Hyperlipidemia Father   . Hypertension Father   . Heart failure Father 52  . Diabetes Sister   . Arthritis Paternal Grandmother   . Kidney disease Paternal Grandmother   . Diabetes Paternal Grandmother   . Arthritis Paternal Grandfather     BP 144/87 mmHg  Pulse 67  Ht 5\' 3"  (1.6 m)  Wt 140 lb (63.504 kg)  BMI 24.81 kg/m2  Review of Systems: See HPI  above.    Objective:  Physical Exam:  Gen: NAD  Neck: No gross deformity, swelling, bruising. TTP right cervical paraspinal region and lateral epicondyle, forearm.  No midline/bony TTP. FROM neck - pain on flexion and right lateral rotation. BUE strength 5/5.   Sensation intact to light touch.   2+ equal reflexes in biceps, trace triceps and brachioradialis tendons. Negative spurlings. NV intact distal BUEs.    Assessment & Plan:  1. Neck pain - concerning for cervical radiculopathy which could explain why lateral epicondylitis is not improving (referred from neck).  Prednisone, tramadol, PT without benefit.  Advised we go ahead with x-rays and MRI of cervical spine to further assess for disc herniation and radiculopathy.  Addendum:  MRI cervical spine was reviewed and discussed with patient.  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  We discussed this typically does not go past the shoulder in distribution but it's possible this is the source of her pain - advised we try a facet injection here - call us 1-2 weeks following this to let us know how she's doing.  If still not improving would have her consult with orthopedic surgery regarding her lateral epicondylitis.

## 2014-04-17 ENCOUNTER — Ambulatory Visit (HOSPITAL_BASED_OUTPATIENT_CLINIC_OR_DEPARTMENT_OTHER)
Admission: RE | Admit: 2014-04-17 | Discharge: 2014-04-17 | Disposition: A | Discharge status: Home / Self Care | Payer: BLUE CROSS/BLUE SHIELD | Source: Ambulatory Visit | Attending: Family Medicine | Admitting: Family Medicine

## 2014-04-17 DIAGNOSIS — M542 Cervicalgia: Secondary | ICD-10-CM | POA: Diagnosis not present

## 2014-04-17 DIAGNOSIS — M47892 Other spondylosis, cervical region: Secondary | ICD-10-CM | POA: Insufficient documentation

## 2014-04-20 ENCOUNTER — Other Ambulatory Visit: Payer: Self-pay | Admitting: Family Medicine

## 2014-04-20 DIAGNOSIS — M542 Cervicalgia: Secondary | ICD-10-CM

## 2014-04-22 ENCOUNTER — Ambulatory Visit
Admission: RE | Admit: 2014-04-22 | Discharge: 2014-04-22 | Disposition: A | Discharge status: Home / Self Care | Payer: BLUE CROSS/BLUE SHIELD | Source: Ambulatory Visit | Attending: Family Medicine | Admitting: Family Medicine

## 2014-04-22 DIAGNOSIS — M542 Cervicalgia: Secondary | ICD-10-CM

## 2014-04-22 MED ORDER — IOHEXOL 300 MG/ML  SOLN
1.0000 mL | Freq: Once | INTRAMUSCULAR | Status: AC | PRN
  Administered 2014-04-22: 1 mL via INTRA_ARTICULAR

## 2014-04-22 NOTE — Discharge Instructions (Signed)

## 2014-04-28 ENCOUNTER — Telehealth: Payer: Self-pay | Admitting: *Deleted

## 2014-04-28 NOTE — Telephone Encounter (Signed)
Spoke to patient. Patient stated that she had the ESI on 04-22-14 with no relief. Further stated that her right elbow is still painful especially in the morning and evenings. Painful to lift 10 pounds and still having some swelling.

## 2014-04-28 NOTE — Telephone Encounter (Signed)
As we discussed next step is to refer her to orthopedic surgery for tennis elbow that isn't improving with conservative measures.  Thanks!

## 2014-04-28 NOTE — Addendum Note (Signed)
Addended by: Sherrie George F on: 04/28/2014 02:55 PM   Modules accepted: Orders

## 2014-04-28 NOTE — Telephone Encounter (Signed)
Spoke with patient and will be setting up an appointment with orthopedic surgeon. Patient agreed.

## 2014-05-07 ENCOUNTER — Telehealth: Payer: Self-pay | Admitting: Family

## 2014-05-07 NOTE — Telephone Encounter (Signed)
Pt scheduled follow up for 05/19/2014

## 2014-05-07 NOTE — Telephone Encounter (Signed)
Pt last seen by PCP for acute issue on 10/16/13. Last TSH level 09/2013. Pt has not future appts scheduled with PCP. 90 day supply of tsh sent to mail order. When should pt follow up in the office?

## 2014-05-07 NOTE — Telephone Encounter (Signed)
Left message with patient son to return my call

## 2014-05-07 NOTE — Telephone Encounter (Signed)
Please call pt to arrange follow up soon. Thanks!

## 2014-05-07 NOTE — Telephone Encounter (Signed)
Due for follow up please

## 2014-05-14 ENCOUNTER — Encounter: Payer: Self-pay | Admitting: Family Medicine

## 2014-05-18 ENCOUNTER — Telehealth: Payer: Self-pay | Admitting: *Deleted

## 2014-05-18 NOTE — Telephone Encounter (Signed)
Patient called this morning and stated that she had the ESI injection 3 weeks ago for her neck and is still in a lot of pain. The pain is going to her shoulder and upper back. She stated that she would like to see [place] tomorrow (Wednesday) about the pain.

## 2014-05-18 NOTE — Telephone Encounter (Signed)
Spoke to patient and told her that we will set up for nerve conduction study. Patient agreed. Will contact [facility].

## 2014-05-18 NOTE — Telephone Encounter (Signed)
The only thing I can think to offer her at this point would be to try nerve conduction studies to see if that can identify a problem.  Otherwise she has tried all the conservative measures for her neck and lateral epicondylitis and there's nothing on the MRI I would expect surgery to fix.

## 2014-05-19 ENCOUNTER — Encounter: Payer: Self-pay | Admitting: Family

## 2014-05-19 ENCOUNTER — Ambulatory Visit (INDEPENDENT_AMBULATORY_CARE_PROVIDER_SITE_OTHER): Payer: BLUE CROSS/BLUE SHIELD | Admitting: Family

## 2014-05-19 VITALS — BP 112/84 | HR 66 | Temp 97.7°F | Resp 18 | Ht 64.0 in | Wt 137.6 lb

## 2014-05-19 DIAGNOSIS — E039 Hypothyroidism, unspecified: Secondary | ICD-10-CM

## 2014-05-19 DIAGNOSIS — M7711 Lateral epicondylitis, right elbow: Secondary | ICD-10-CM | POA: Diagnosis not present

## 2014-05-19 DIAGNOSIS — M542 Cervicalgia: Secondary | ICD-10-CM | POA: Diagnosis not present

## 2014-05-19 LAB — TSH: TSH: 0.97 u[IU]/mL (ref 0.35–4.50)

## 2014-05-19 NOTE — Assessment & Plan Note (Signed)
Obtain follow up TSH.   Continue synthroid. Clinically stable.

## 2014-05-19 NOTE — Progress Notes (Signed)
Subjective:    Patient ID: Donna Anderson, female    DOB: [birth year], 54 y.o.   MRN: [id number]  HPI  [name] is a 54 yr old female who presents today for follow up.  1) Hypothyroid- maintained on levothyroxine 112 mcg.   Lab Results  Component Value Date   TSH 1.26 09/16/2013   2) Neck pain- has been following with Dr. Barbaraann Barthel for elbow and neck pain. She did have a facet injection C3-4 on 4/7. She reports ongoing pain.  Very frustrated. She is maintained on tramadol per Dr. Barbaraann Barthel.   3) R elbow pain-  Saw ortho, Raliegh Ip, she is scheduled for an MRI of her right elbow.       Review of Systems See HPI  Past Medical History  Diagnosis Date  . Arthritis   . History of chicken pox   . Depression   . Diverticulosis 2015    CT SCAN   . Hyperlipidemia   . Kidney stones   . History of migraine   . Graves disease 1997  . Hearing loss     History   Social History  . Marital Status: Married    Spouse Name: N/A  . Number of Children: N/A  . Years of Education: N/A   Occupational History  . Not on file.   Social History Main Topics  . Smoking status: Former Smoker -- 15 years    Quit date: 01/16/1999  . Smokeless tobacco: Never Used  . Alcohol Use: 0.0 oz/week    0 Standard drinks or equivalent per week     Comment: occasional  . Drug Use: No  . Sexual Activity: Yes    Birth Control/ Protection: None   Other Topics Concern  . Not on file   Social History Narrative   Married (second marriage) has a 6 yr old son in [place]   Worked in Psychologist, educational   Enjoys exercising, hiking, kayaking.    Completed 12th grade   1 dog, 1 cat          Past Surgical History  Procedure Laterality Date  . Appendectomy  2001  . Oophorectomy Right 2001    due to large ovarian cyst  . Hip fracture surgery Right 2009    fell playing with dog  . Labrum repair Right 2011  . Thyroidectomy  1997  . Endometrial ablation  2003    Family History  Problem Relation Age  of Onset  . Hyperlipidemia Father   . Hypertension Father   . Heart failure Father 63  . Diabetes Sister   . Arthritis Paternal Grandmother   . Kidney disease Paternal Grandmother   . Diabetes Paternal Grandmother   . Arthritis Paternal Grandfather     No Known Allergies  Current Outpatient Prescriptions on File Prior to Visit  Medication Sig Dispense Refill  . levothyroxine (SYNTHROID, LEVOTHROID) 112 MCG tablet TAKE 1 TABLET DAILY BEFORE BREAKFAST 90 tablet 0  . traMADol (ULTRAM) 50 MG tablet Take 1 tablet (50 mg total) by mouth every 6 (six) hours as needed. 60 tablet 0   No current facility-administered medications on file prior to visit.    BP 112/84 mmHg  Pulse 66  Temp(Src) 97.7 F (36.5 C) (Oral)  Resp 18  Ht 5\' 4"  (1.626 m)  Wt 137 lb 9.6 oz (62.415 kg)  BMI 23.61 kg/m2  SpO2 99%       Objective:   Physical Exam  Constitutional: She is oriented to person, place,  and time. She appears well-developed and well-nourished. No distress.  HENT:  Head: Normocephalic and atraumatic.  Cardiovascular: Normal rate and regular rhythm.   No murmur heard. Pulmonary/Chest: Effort normal and breath sounds normal. No respiratory distress. She has no wheezes. She has no rales. She exhibits no tenderness.  Musculoskeletal: She exhibits no edema.  Neurological: She is alert and oriented to person, place, and time.  Skin: Skin is warm and dry.  Psychiatric: She has a normal mood and affect. Her behavior is normal. Judgment and thought content normal.          Assessment & Plan:

## 2014-05-19 NOTE — Patient Instructions (Addendum)
Contact Dr. Percell Miller re: issue with your prescription.  [phone] Please schedule a physical at your convenience.

## 2014-05-19 NOTE — Assessment & Plan Note (Signed)
Unchanged. Defer management to sports med and ortho.

## 2014-05-19 NOTE — Assessment & Plan Note (Signed)
Unchanged, management per ortho.

## 2014-05-19 NOTE — Progress Notes (Signed)
Pre visit review using our clinic review tool, if applicable. No additional management support is needed unless otherwise documented below in the visit note. 

## 2014-05-21 ENCOUNTER — Other Ambulatory Visit: Payer: Self-pay | Admitting: *Deleted

## 2014-05-21 DIAGNOSIS — M79601 Pain in right arm: Secondary | ICD-10-CM

## 2014-05-30 ENCOUNTER — Encounter: Payer: Self-pay | Admitting: Family

## 2014-05-30 ENCOUNTER — Encounter: Payer: Self-pay | Admitting: Family Medicine

## 2014-05-30 ENCOUNTER — Encounter: Payer: Self-pay | Admitting: Neurology

## 2014-06-03 ENCOUNTER — Ambulatory Visit: Payer: BLUE CROSS/BLUE SHIELD | Admitting: Family Medicine

## 2014-06-22 ENCOUNTER — Telehealth: Payer: Self-pay | Admitting: Neurology

## 2014-06-22 NOTE — Telephone Encounter (Signed)
I called the patient. Her right arm is in a cast and it is the arm the EMG/NCV is scheduled to be performed on. I asked that she call back to reschedule her appointment to a later date when she will be out of her cast.

## 2014-06-22 NOTE — Telephone Encounter (Signed)
Patient called/returning Donna Anderson's call. I relayed the information and patient decided to cancel her EMG/NCV for not and will call back later to r/s.

## 2014-06-22 NOTE — Telephone Encounter (Signed)
Patient called and requested to speak with the nurse regarding her upcoming appt.She stated that she had surgery on her right elbow on 6/2 and her arm is still in a cast and she wants to know if this will interfere with her appt? Please call and advise.

## 2014-06-23 ENCOUNTER — Emergency Department (HOSPITAL_BASED_OUTPATIENT_CLINIC_OR_DEPARTMENT_OTHER): Payer: BLUE CROSS/BLUE SHIELD

## 2014-06-23 ENCOUNTER — Encounter: Payer: Self-pay | Admitting: Internal Medicine

## 2014-06-23 ENCOUNTER — Ambulatory Visit (INDEPENDENT_AMBULATORY_CARE_PROVIDER_SITE_OTHER): Payer: BLUE CROSS/BLUE SHIELD | Admitting: Internal Medicine

## 2014-06-23 ENCOUNTER — Encounter (HOSPITAL_BASED_OUTPATIENT_CLINIC_OR_DEPARTMENT_OTHER): Payer: Self-pay

## 2014-06-23 ENCOUNTER — Emergency Department (HOSPITAL_BASED_OUTPATIENT_CLINIC_OR_DEPARTMENT_OTHER)
Admission: EM | Admit: 2014-06-23 | Discharge: 2014-06-23 | Disposition: A | Discharge status: Home / Self Care | Payer: BLUE CROSS/BLUE SHIELD | Attending: Emergency Medicine | Admitting: Emergency Medicine

## 2014-06-23 VITALS — BP 124/64 | HR 69 | Temp 98.0°F | Ht 64.0 in | Wt 134.5 lb

## 2014-06-23 DIAGNOSIS — Z87738 Personal history of other specified (corrected) congenital malformations of digestive system: Secondary | ICD-10-CM | POA: Diagnosis not present

## 2014-06-23 DIAGNOSIS — Z8679 Personal history of other diseases of the circulatory system: Secondary | ICD-10-CM | POA: Insufficient documentation

## 2014-06-23 DIAGNOSIS — Z8619 Personal history of other infectious and parasitic diseases: Secondary | ICD-10-CM | POA: Diagnosis not present

## 2014-06-23 DIAGNOSIS — Z8639 Personal history of other endocrine, nutritional and metabolic disease: Secondary | ICD-10-CM | POA: Diagnosis not present

## 2014-06-23 DIAGNOSIS — E0500 Thyrotoxicosis with diffuse goiter without thyrotoxic crisis or storm: Secondary | ICD-10-CM | POA: Insufficient documentation

## 2014-06-23 DIAGNOSIS — M1990 Unspecified osteoarthritis, unspecified site: Secondary | ICD-10-CM | POA: Insufficient documentation

## 2014-06-23 DIAGNOSIS — F319 Bipolar disorder, unspecified: Secondary | ICD-10-CM | POA: Diagnosis not present

## 2014-06-23 DIAGNOSIS — Z79899 Other long term (current) drug therapy: Secondary | ICD-10-CM | POA: Insufficient documentation

## 2014-06-23 DIAGNOSIS — Z9889 Other specified postprocedural states: Secondary | ICD-10-CM | POA: Insufficient documentation

## 2014-06-23 DIAGNOSIS — H9190 Unspecified hearing loss, unspecified ear: Secondary | ICD-10-CM | POA: Diagnosis not present

## 2014-06-23 DIAGNOSIS — Z87891 Personal history of nicotine dependence: Secondary | ICD-10-CM | POA: Diagnosis not present

## 2014-06-23 DIAGNOSIS — G43909 Migraine, unspecified, not intractable, without status migrainosus: Secondary | ICD-10-CM | POA: Diagnosis not present

## 2014-06-23 DIAGNOSIS — G43009 Migraine without aura, not intractable, without status migrainosus: Secondary | ICD-10-CM

## 2014-06-23 DIAGNOSIS — Z9049 Acquired absence of other specified parts of digestive tract: Secondary | ICD-10-CM | POA: Insufficient documentation

## 2014-06-23 DIAGNOSIS — Z87442 Personal history of urinary calculi: Secondary | ICD-10-CM | POA: Diagnosis not present

## 2014-06-23 DIAGNOSIS — K5900 Constipation, unspecified: Secondary | ICD-10-CM

## 2014-06-23 LAB — COMPREHENSIVE METABOLIC PANEL
ALK PHOS: 83 U/L (ref 38–126)
ALT: 13 U/L — ABNORMAL LOW (ref 14–54)
AST: 15 U/L (ref 15–41)
Albumin: 4.2 g/dL (ref 3.5–5.0)
Anion gap: 9 (ref 5–15)
BILIRUBIN TOTAL: 0.6 mg/dL (ref 0.3–1.2)
BUN: 12 mg/dL (ref 6–20)
CHLORIDE: 105 mmol/L (ref 101–111)
CO2: 23 mmol/L (ref 22–32)
CREATININE: 0.73 mg/dL (ref 0.44–1.00)
Calcium: 8.4 mg/dL — ABNORMAL LOW (ref 8.9–10.3)
GFR calc non Af Amer: >60 mL/min (ref >60)
Glucose, Bld: 110 mg/dL — ABNORMAL HIGH (ref 65–99)
Potassium: 3.7 mmol/L (ref 3.5–5.1)
Sodium: 137 mmol/L (ref 135–145)
TOTAL PROTEIN: 7.2 g/dL (ref 6.5–8.1)

## 2014-06-23 LAB — CBC WITH DIFFERENTIAL/PLATELET
Basophils Absolute: 0 10*3/uL (ref 0.0–0.1)
Basophils Relative: 0 % (ref 0–1)
EOS ABS: 0.1 10*3/uL (ref 0.0–0.7)
Eosinophils Relative: 1 % (ref 0–5)
HCT: 44.8 % (ref 36.0–46.0)
Hemoglobin: 14.9 g/dL (ref 12.0–15.0)
LYMPHS ABS: 1.4 10*3/uL (ref 0.7–4.0)
Lymphocytes Relative: 23 % (ref 12–46)
MCH: 30.4 pg (ref 26.0–34.0)
MCHC: 33.3 g/dL (ref 30.0–36.0)
MCV: 91.4 fL (ref 78.0–100.0)
MONOS PCT: 6 % (ref 3–12)
Monocytes Absolute: 0.4 10*3/uL (ref 0.1–1.0)
NEUTROS ABS: 4.2 10*3/uL (ref 1.7–7.7)
NEUTROS PCT: 70 % (ref 43–77)
PLATELETS: 300 10*3/uL (ref 150–400)
RBC: 4.9 MIL/uL (ref 3.87–5.11)
RDW: 13.1 % (ref 11.5–15.5)
WBC: 6 10*3/uL (ref 4.0–10.5)

## 2014-06-23 LAB — LIPASE, BLOOD: Lipase: 13 U/L — ABNORMAL LOW (ref 22–51)

## 2014-06-23 MED ORDER — DIPHENHYDRAMINE HCL 50 MG/ML IJ SOLN
25.0000 mg | Freq: Once | INTRAMUSCULAR | Status: AC
  Administered 2014-06-23: 25 mg via INTRAVENOUS
  Filled 2014-06-23: qty 1

## 2014-06-23 MED ORDER — METOCLOPRAMIDE HCL 5 MG/ML IJ SOLN
10.0000 mg | Freq: Once | INTRAMUSCULAR | Status: AC
  Administered 2014-06-23: 10 mg via INTRAVENOUS
  Filled 2014-06-23: qty 2

## 2014-06-23 MED ORDER — POLYETHYLENE GLYCOL 3350 17 G PO PACK: 17.0000 g | PACK | Freq: Every day | ORAL | Status: DC

## 2014-06-23 MED ORDER — FLEET ENEMA 7-19 GM/118ML RE ENEM
1.0000 | ENEMA | Freq: Once | RECTAL | Status: AC
  Administered 2014-06-23: 1 via RECTAL
  Filled 2014-06-23: qty 1

## 2014-06-23 MED ORDER — SODIUM CHLORIDE 0.9 % IV BOLUS (SEPSIS)
1000.0000 mL | Freq: Once | INTRAVENOUS | Status: AC
  Administered 2014-06-23: 1000 mL via INTRAVENOUS

## 2014-06-23 NOTE — ED Notes (Signed)
Patient transported to X-ray 

## 2014-06-23 NOTE — Progress Notes (Signed)
Subjective:    Patient ID: Donna Anderson, female    DOB: April 17, 1960, 54 y.o.   MRN: [id number]  DOS:  06/23/2014 Type of visit - description : acute Interval history: Had elbow surgery 6 days ago, since then having problems: Very constipated, developed nausea and vomiting 2 days ago. Her surgeon asked her to take Colace last night and she did -- > had  loose bowel movement today, very little amount of stools, she had to strain significantly and saw some red blood she thinks related to hemorrhoids. The nausea and vomiting are ongoing, she has been unable to eat solids, and is drinking very little amount of fluids.  Today, she developed a severe global headache, different from her previous migraines, she states that it is the worst of her life. Of notice, she is taking tramadol but she has been taking tramadol for a while even before the surgery.    Review of Systems  Denies fever or chills Yesterday she got a little short of breath when she tried to take a deep breath associated with anterior chest pain. She admits to a ill-defined abdominal discomfort. No dysuria or gross hematuria No leg swelling pain. Admits she is getting a little dizzy when she stands up   Past Medical History  Diagnosis Date  . Arthritis   . History of chicken pox   . Depression   . Diverticulosis 2015    CT SCAN   . Hyperlipidemia   . Kidney stones   . History of migraine   . Graves disease 1997  . Hearing loss     Past Surgical History  Procedure Laterality Date  . Appendectomy  2001  . Oophorectomy Right 2001    due to large ovarian cyst  . Hip fracture surgery Right 2009    fell playing with dog  . Labrum repair Right 2011  . Thyroidectomy  1997  . Endometrial ablation  2003  . Elbow surgery  2016    History   Social History  . Marital Status: Married    Spouse Name: N/A  . Number of Children: N/A  . Years of Education: N/A   Occupational History  . Not on file.   Social  History Main Topics  . Smoking status: Former Smoker -- 15 years    Quit date: 01/16/1999  . Smokeless tobacco: Never Used  . Alcohol Use: 0.0 oz/week    0 Standard drinks or equivalent per week     Comment: occasional  . Drug Use: No  . Sexual Activity: Yes    Birth Control/ Protection: None   Other Topics Concern  . Not on file   Social History Narrative   Married (second marriage) has a 9 yr old son in [place]   Worked in Psychologist, educational   Enjoys exercising, hiking, kayaking.    Completed 12th grade   1 dog, 1 cat              Medication List       This list is accurate as of: 06/23/14 11:20 AM.  Always use your most recent med list.               levothyroxine 112 MCG tablet  Commonly known as:  SYNTHROID, LEVOTHROID  TAKE 1 TABLET DAILY BEFORE BREAKFAST     traMADol 50 MG tablet  Commonly known as:  ULTRAM  Take 1 tablet (50 mg total) by mouth every 6 (six) hours as needed.  Objective:   Physical Exam BP 124/64 mmHg  Pulse 69  Temp(Src) 98 F (36.7 C) (Oral)  Ht 5\' 4"  (1.626 m)  Wt 134 lb 8 oz (61.009 kg)  BMI 23.08 kg/m2  SpO2 97%  General:   Well developed, well nourished; symptoms is slightly uncomfortable but nontoxic.  HEENT:  Normocephalic . Face symmetric, atraumatic Lungs:  CTA B Normal respiratory effort, no intercostal retractions, no accessory muscle use. Heart: RRR,  no murmur.  no pretibial edema bilaterally  Abdomen:  Not distended, soft, good bowel sounds, mild diffuse tenderness without guarding or rigidity Skin: Not pale. Not jaundice Neurologic:  alert & oriented X3.  Speech normal, gait appropriate for age and unassisted Psych--  Cognition and judgment appear intact.  Cooperative with normal attention span and concentration.  Behavior appropriate. No anxious or depressed appearing.      Assessment & Plan:    Presents  to the office with multiple symptoms after elbow surgery 6 days ago: Constipation, nausea  vomiting, abdominal tenderness Poor by mouth intake (reports + weakness when she stands but vital signs are stable) Severe headache "worse of life". Episode of difficulty breathing and chest pain yesterday. Plan: Refer to ER for further eval.

## 2014-06-23 NOTE — ED Provider Notes (Signed)
CSN: [id number]     Arrival date & time 06/23/14  1134 History   First MD Initiated Contact with Patient 06/23/14 1138     Chief Complaint  Patient presents with  . Constipation     (Consider location/radiation/quality/duration/timing/severity/associated sxs/prior Treatment) The history is provided by the patient.  Donna Anderson is a 54 y.o. female history of depression, hyperlipidemia, migraines who presenting with constipation, vomiting, headaches. Patient had recent right elbow surgery for tennis elbow 6 days ago. Has been taking tramadol afterwards but has been constipated. Had no bowel movement until this morning. Took Colace yesterday and only has some hard bowel movements. Complains of some diffuse abdominal pain. Has poor appetite with some nausea. Since yesterday started having worsening headaches. Has history of migraines but this is worsened usual. It is not sudden onset and has no history of aneurysms.    Past Medical History  Diagnosis Date  . Arthritis   . History of chicken pox   . Depression   . Diverticulosis 2015    CT SCAN   . Hyperlipidemia   . Kidney stones   . History of migraine   . Graves disease 1997  . Hearing loss    Past Surgical History  Procedure Laterality Date  . Appendectomy  2001  . Oophorectomy Right 2001    due to large ovarian cyst  . Hip fracture surgery Right 2009    fell playing with dog  . Labrum repair Right 2011  . Thyroidectomy  1997  . Endometrial ablation  2003  . Elbow surgery Right 2016   Family History  Problem Relation Age of Onset  . Hyperlipidemia Father   . Hypertension Father   . Heart failure Father 50  . Diabetes Sister   . Arthritis Paternal Grandmother   . Kidney disease Paternal Grandmother   . Diabetes Paternal Grandmother   . Arthritis Paternal Grandfather    History  Substance Use Topics  . Smoking status: Former Smoker -- 15 years    Quit date: 01/16/1999  . Smokeless tobacco: Never Used  . Alcohol  Use: 0.0 oz/week    0 Standard drinks or equivalent per week     Comment: occasional   OB History    No data available     Review of Systems  Gastrointestinal: Positive for nausea, abdominal pain and constipation.  Neurological: Positive for headaches.  All other systems reviewed and are negative.     Allergies  Review of patient's allergies indicates no known allergies.  Home Medications   Prior to Admission medications   Medication Sig Start Date End Date Taking? Authorizing Provider  levothyroxine (SYNTHROID, LEVOTHROID) 112 MCG tablet TAKE 1 TABLET DAILY BEFORE BREAKFAST 05/07/14   Debbrah Alar, NP  traMADol (ULTRAM) 50 MG tablet Take 1 tablet (50 mg total) by mouth every 6 (six) hours as needed. 04/07/14   Dene Gentry, MD   BP 141/80 mmHg  Pulse 79  Temp(Src) 97.9 F (36.6 C) (Oral)  Resp 18  Ht 5\' 3"  (1.6 m)  Wt 134 lb (60.782 kg)  BMI 23.74 kg/m2  SpO2 100% Physical Exam  Constitutional: She is oriented to person, place, and time.  Uncomfortable   HENT:  Head: Normocephalic.  MM slightly dry   Eyes: Conjunctivae are normal. Pupils are equal, round, and reactive to light.  Neck: Normal range of motion. Neck supple.  Cardiovascular: Normal rate, regular rhythm and normal heart sounds.   Pulmonary/Chest: Effort normal and breath sounds normal. No  respiratory distress. She has no wheezes. She has no rales.  Abdominal: Soft. Bowel sounds are normal.  Mild diffuse tenderness, worse in LLQ. No rebound.   Musculoskeletal: Normal range of motion. She exhibits no edema or tenderness.  Neurological: She is alert and oriented to person, place, and time. No cranial nerve deficit. Coordination normal.  Nl strength throughout. (R elbow in sling from recent surgery but has nl hand grasp). CN 2-12 intact   Skin: Skin is warm and dry.  Psychiatric: She has a normal mood and affect. Her behavior is normal. Judgment and thought content normal.  Nursing note and vitals  reviewed.   ED Course  Procedures (including critical care time) Labs Review Labs Reviewed  COMPREHENSIVE METABOLIC PANEL - Abnormal; Notable for the following:    Glucose, Bld 110 (*)    Calcium 8.4 (*)    ALT 13 (*)    All other components within normal limits  LIPASE, BLOOD - Abnormal; Notable for the following:    Lipase 13 (*)    All other components within normal limits  CBC WITH DIFFERENTIAL/PLATELET    Imaging Review Ct Head Wo Contrast  06/23/2014   CLINICAL DATA:  54 year old female with severe headache since yesterday. No known injury. Initial encounter.  EXAM: CT HEAD WITHOUT CONTRAST  TECHNIQUE: Contiguous axial images were obtained from the base of the skull through the vertex without intravenous contrast.  COMPARISON:  Cervical spine MRI 04/17/2014.  FINDINGS: Visualized paranasal sinuses and mastoids are clear. No osseous abnormality identified. Visualized orbits and scalp soft tissues are within normal limits.  Cerebral volume is normal. No suspicious intracranial vascular hyperdensity. No midline shift, ventriculomegaly, mass effect, evidence of mass lesion, intracranial hemorrhage or evidence of cortically based acute infarction. Gray-white matter differentiation is within normal limits throughout the brain.  IMPRESSION: Normal noncontrast CT appearance of the brain.   Electronically Signed   By: Genevie Ann M.D.   On: 06/23/2014 12:02   Dg Abd 2 Views  06/23/2014   CLINICAL DATA:  Constipation since elbow surgery on Thursday. Nausea, vomiting.  EXAM: ABDOMEN - 2 VIEW  COMPARISON:  CT 04/08/2013  FINDINGS: There is normal bowel gas pattern. No free air. No organomegaly or suspicious calcification. No acute bony abnormality. Calcified phleboliths in the pelvis.  IMPRESSION: No acute findings.   Electronically Signed   By: Rolm Baptise M.D.   On: 06/23/2014 12:07     EKG Interpretation None      MDM   Final diagnoses:  Constipation    Donna Anderson is a 54 y.o.  female here with constipation, headache. Constipation likely from surgery, tramadol use. Will get xray to r/o SBO. Headache likely migraine, low suspicion for [facility] so if CT head neg, will not need LP. Will give migraine cocktail and reassess.   1:36 PM CT head nl. Labs unremarkable. Xray showed constipation. Given enema with good results. Recommend miralax daily until regular bowel movements.    Wandra Arthurs, MD 06/23/14 [phone]

## 2014-06-23 NOTE — Patient Instructions (Signed)
Go to the ER downstairs,  I recommend  further testing and treatment today

## 2014-06-23 NOTE — Discharge Instructions (Signed)
Take miralax daily until you have regular, soft bowel movements.  Continue colace.   Follow up with your doctor.   Return to ER if you have worse constipation, headache, abdominal pain.

## 2014-06-23 NOTE — Progress Notes (Signed)
Pre visit review using our clinic review tool, if applicable. No additional management support is needed unless otherwise documented below in the visit note. 

## 2014-06-23 NOTE — ED Notes (Signed)
Pt directed to pharmacy to pick up meds- pt's husband is at bedside to drive- pt ambulatory

## 2014-06-23 NOTE — ED Notes (Signed)
Pt reports she feels better and earlier symptoms of "feeling bad" have resolved. Reports headache is now a 3

## 2014-06-23 NOTE — ED Notes (Addendum)
Pt reports feeling bad during Reglan administration- states she feels "heavy" and bad- only  5mg  infused then stopped due to pt complaint. Dr Darl Householder notified

## 2014-06-23 NOTE — ED Notes (Signed)
Sent from Dr Larose Kells office in the building for constipation and vomiting-elbow surgery last Thursday and has been taking tramadol

## 2014-06-23 NOTE — ED Notes (Signed)
Pt's husband at bedside helping pt get dressed

## 2014-06-25 ENCOUNTER — Other Ambulatory Visit: Payer: BLUE CROSS/BLUE SHIELD | Admitting: Neurology

## 2014-06-25 ENCOUNTER — Encounter: Payer: BLUE CROSS/BLUE SHIELD | Admitting: Neurology

## 2014-07-23 ENCOUNTER — Encounter: Payer: Self-pay | Admitting: Neurology

## 2014-08-03 ENCOUNTER — Encounter: Payer: BLUE CROSS/BLUE SHIELD | Admitting: Neurology

## 2014-08-04 ENCOUNTER — Other Ambulatory Visit: Payer: Self-pay | Admitting: Family

## 2014-09-20 ENCOUNTER — Encounter (HOSPITAL_COMMUNITY): Payer: Self-pay | Admitting: Emergency Medicine

## 2014-09-20 ENCOUNTER — Emergency Department (HOSPITAL_COMMUNITY)
Admission: EM | Admit: 2014-09-20 | Discharge: 2014-09-20 | Disposition: A | Discharge status: Home / Self Care | Payer: BLUE CROSS/BLUE SHIELD | Source: Home / Self Care | Attending: Family Medicine | Admitting: Family Medicine

## 2014-09-20 DIAGNOSIS — S61011A Laceration without foreign body of right thumb without damage to nail, initial encounter: Secondary | ICD-10-CM

## 2014-09-20 NOTE — ED Provider Notes (Signed)
CSN: [id number]     Arrival date & time 09/20/14  1815 History   First MD Initiated Contact with Patient 09/20/14 1843     Chief Complaint  Patient presents with  . Laceration   (Consider location/radiation/quality/duration/timing/severity/associated sxs/prior Treatment) HPI  Laceration to the right thumb. Patient states she was cutting an onion and she sliced her hand. Last tetanus was approximately 1-2 years ago. Area was immediately painful and bleeding. Applied pressure with resolution of the bleeding. Denies any LOC, has been lightheaded since the injury and she does not handle blood very well per patient. Denies any loss of sensation or loss of function of the finger. Symptoms are constant but are not getting better or worse. No radiation of the symptoms.  Past Medical History  Diagnosis Date  . Arthritis   . History of chicken pox   . Depression   . Diverticulosis 2015    CT SCAN   . Hyperlipidemia   . Kidney stones   . History of migraine   . Graves disease 1997  . Hearing loss    Past Surgical History  Procedure Laterality Date  . Appendectomy  2001  . Oophorectomy Right 2001    due to large ovarian cyst  . Hip fracture surgery Right 2009    fell playing with dog  . Labrum repair Right 2011  . Thyroidectomy  1997  . Endometrial ablation  2003  . Elbow surgery Right 2016   Family History  Problem Relation Age of Onset  . Hyperlipidemia Father   . Hypertension Father   . Heart failure Father 33  . Diabetes Sister   . Arthritis Paternal Grandmother   . Kidney disease Paternal Grandmother   . Diabetes Paternal Grandmother   . Arthritis Paternal Grandfather    Social History  Substance Use Topics  . Smoking status: Former Smoker -- 15 years    Quit date: 01/16/1999  . Smokeless tobacco: Never Used  . Alcohol Use: 0.0 oz/week    0 Standard drinks or equivalent per week     Comment: occasional   OB History    No data available     Review of Systems Per  HPI with all other pertinent systems negative.   Allergies  Review of patient's allergies indicates no known allergies.  Home Medications   Prior to Admission medications   Medication Sig Start Date End Date Taking? Authorizing Provider  levothyroxine (SYNTHROID, LEVOTHROID) 112 MCG tablet TAKE 1 TABLET DAILY BEFORE BREAKFAST 08/04/14   Debbrah Alar, NP  polyethylene glycol (MIRALAX / GLYCOLAX) packet Take 17 g by mouth daily. 06/23/14   Wandra Arthurs, MD  traMADol (ULTRAM) 50 MG tablet Take 1 tablet (50 mg total) by mouth every 6 (six) hours as needed. 04/07/14   Dene Gentry, MD   Meds Ordered and Administered this Visit  Medications - No data to display  BP 127/86 mmHg  Pulse 71  Temp(Src) 98 F (36.7 C) (Oral)  Resp 14  SpO2 96% No data found.   Physical Exam Physical Exam  Constitutional: oriented to person, place, and time. appears well-developed and well-nourished. No distress.  HENT:  Head: Normocephalic and atraumatic.  Eyes: EOMI. PERRL.  Neck: Normal range of motion.  Cardiovascular: RRR, no m/r/g, 2+ distal pulses,  Pulmonary/Chest: Effort normal and breath sounds normal. No respiratory distress.  Abdominal: Soft. Bowel sounds are normal. NonTTP, no distension.  Musculoskeletal: Normal range of motion. Non ttp, no effusion.  Neurological: alert and oriented to  person, place, and time.  Skin: Complete removal of the epidermis and significant dermal loss over an area of approximately 2.3 I 1.5 cm of the lateral edge of the right thumb. Mild loss of the lateral edge of the nail.Marland Kitchen  Psychiatric: normal mood and affect. behavior is normal. Judgment and thought content normal.   ED Course  LACERATION REPAIR Date/Time: 09/20/2014 8:58 PM Performed by: Marily Memos, Veldon Wager J Authorized by: Marily Memos, Demmi Sindt J Consent: Verbal consent obtained. Risks and benefits: risks, benefits and alternatives were discussed Consent given by: patient Patient identity confirmed: verbally  with patient Location: Right lateral thumb. Laceration length: 2.3 cm Tendon involvement: none Nerve involvement: none Vascular damage: yes Anesthesia: digital block Local anesthetic: lidocaine 2% with epinephrine Anesthetic total: 5 ml Patient sedated: no Irrigation solution: Betadine and saline. Amount of cleaning: standard Debridement: none Degree of undermining: none Skin closure: glue Patient tolerance: Patient tolerated the procedure well with no immediate complications   (including critical care time)  Labs Review Labs Reviewed - No data to display  Imaging Review No results found.   Visual Acuity Review  Right Eye Distance:   Left Eye Distance:   Bilateral Distance:    Right Eye Near:   Left Eye Near:    Bilateral Near:         MDM   1. Thumb laceration, right, initial encounter    Detailed wound care structures provided, tetanus is up-to-date, patient to call back if develops signs and symptoms of infection.    Waldemar Dickens, MD 09/20/14 [phone]

## 2014-09-20 NOTE — ED Notes (Signed)
C/o right thumb laceration States she cut thumb on slicer  Tetanus up to date

## 2014-09-20 NOTE — Discharge Instructions (Signed)
D continue her thumb was repaired using Dermabond. This will take 7-10 days to come off. The area is waterproof. Please wash and shower as you normally would on a daily basis. If there is any additional oozing of the wound after the Dermabond comes off you can use a sterile bandage until it stops. Please call us if you develop any secondary signs of infection such as increasing pain or swelling.

## 2014-09-22 ENCOUNTER — Encounter: Payer: Self-pay | Admitting: Family

## 2014-09-22 ENCOUNTER — Ambulatory Visit (INDEPENDENT_AMBULATORY_CARE_PROVIDER_SITE_OTHER): Payer: BLUE CROSS/BLUE SHIELD | Admitting: Family

## 2014-09-22 VITALS — BP 108/60 | HR 60 | Temp 97.7°F | Resp 16 | Ht 64.0 in | Wt 142.0 lb

## 2014-09-22 DIAGNOSIS — Z0000 Encounter for general adult medical examination without abnormal findings: Secondary | ICD-10-CM

## 2014-09-22 DIAGNOSIS — Z23 Encounter for immunization: Secondary | ICD-10-CM | POA: Diagnosis not present

## 2014-09-22 DIAGNOSIS — G2581 Restless legs syndrome: Secondary | ICD-10-CM | POA: Diagnosis not present

## 2014-09-22 DIAGNOSIS — S61011A Laceration without foreign body of right thumb without damage to nail, initial encounter: Secondary | ICD-10-CM | POA: Diagnosis not present

## 2014-09-22 MED ORDER — ROPINIROLE HCL 0.25 MG PO TABS: ORAL_TABLET | ORAL | Status: DC

## 2014-09-22 NOTE — Progress Notes (Signed)
Subjective:    Patient ID: Donna Anderson, female    DOB: 12/05/1960, 54 y.o.   MRN: [id number]  HPI  Donna Anderson is a 54 yr old female who presents today with two concerns:  1) Leg Problem- pt reports that for the last 3 months she is unable to get her legs to relax at night. Legs feel "funny" and just want to keep jumping.    2) R thumb laceration-  Pt reports that she cut her right thumb with a slicer on Monday.  She is requesting a not for work. She was seen in the ED on 09/22/14 (record is reviewed) and the laceration was repaired with dermabond.  She had right elbow surgery- undergoing PT for her right elbow and her neck.    Review of Systems See HPI  Past Medical History  Diagnosis Date  . Arthritis   . History of chicken pox   . Depression   . Diverticulosis 2015    CT SCAN   . Hyperlipidemia   . Kidney stones   . History of migraine   . Graves disease 1997  . Hearing loss     Social History   Social History  . Marital Status: Married    Spouse Name: N/A  . Number of Children: N/A  . Years of Education: N/A   Occupational History  . Not on file.   Social History Main Topics  . Smoking status: Former Smoker -- 15 years    Quit date: 01/16/1999  . Smokeless tobacco: Never Used  . Alcohol Use: 0.0 oz/week    0 Standard drinks or equivalent per week     Comment: occasional  . Drug Use: No  . Sexual Activity: Yes    Birth Control/ Protection: None   Other Topics Concern  . Not on file   Social History Narrative   Married (second marriage) has a 87 yr old son in [place]   Worked in Psychologist, educational   Enjoys exercising, hiking, kayaking.    Completed 12th grade   1 dog, 1 cat          Past Surgical History  Procedure Laterality Date  . Appendectomy  2001  . Oophorectomy Right 2001    due to large ovarian cyst  . Hip fracture surgery Right 2009    fell playing with dog  . Labrum repair Right 2011  . Thyroidectomy  1997  . Endometrial ablation  2003    . Elbow surgery Right 2016    Family History  Problem Relation Age of Onset  . Hyperlipidemia Father   . Hypertension Father   . Heart failure Father 44  . Diabetes Sister   . Arthritis Paternal Grandmother   . Kidney disease Paternal Grandmother   . Diabetes Paternal Grandmother   . Arthritis Paternal Grandfather     No Known Allergies  Current Outpatient Prescriptions on File Prior to Visit  Medication Sig Dispense Refill  . levothyroxine (SYNTHROID, LEVOTHROID) 112 MCG tablet TAKE 1 TABLET DAILY BEFORE BREAKFAST 90 tablet 1  . polyethylene glycol (MIRALAX / GLYCOLAX) packet Take 17 g by mouth daily. 14 each 0  . traMADol (ULTRAM) 50 MG tablet Take 1 tablet (50 mg total) by mouth every 6 (six) hours as needed. 60 tablet 0   No current facility-administered medications on file prior to visit.    BP 108/60 mmHg  Pulse 60  Temp(Src) 97.7 F (36.5 C) (Oral)  Resp 16  Ht 5\' 4"  (1.626 m)  Wt 142 lb (64.411 kg)  BMI 24.36 kg/m2  SpO2 99%       Objective:   Physical Exam  Constitutional: She appears well-developed and well-nourished.  Cardiovascular: Normal rate, regular rhythm and normal heart sounds.   No murmur heard. Pulmonary/Chest: Effort normal and breath sounds normal. No respiratory distress. She has no wheezes.  Musculoskeletal: She exhibits no edema.  Skin:  Right thumb has a full thickness skin wound which is covered with dermabond and is clean/dry intact  Psychiatric: She has a normal mood and affect. Her behavior is normal. Judgment and thought content normal.          Assessment & Plan:  Thumb laceration- she works with her hands and is unable to work currently. Wound appears clean and dry- no sign of infection.  I have provided  her with a note for work.

## 2014-09-22 NOTE — Progress Notes (Signed)
Pre visit review using our clinic review tool, if applicable. No additional management support is needed unless otherwise documented below in the visit note. 

## 2014-09-22 NOTE — Assessment & Plan Note (Signed)
New. Trial of requip.

## 2014-09-22 NOTE — Patient Instructions (Addendum)
Start Requip for restless legs -0.25 mg once daily 1-3 hours before bedtime. Dose may be increased after 2 days to 0.5 mg daily. Keep thumb wound clean and dry.  Call if redness/swelling, odor occurs. Follow up in 2 months.

## 2014-09-23 ENCOUNTER — Ambulatory Visit (HOSPITAL_BASED_OUTPATIENT_CLINIC_OR_DEPARTMENT_OTHER)
Admission: RE | Admit: 2014-09-23 | Discharge: 2014-09-23 | Disposition: A | Discharge status: Home / Self Care | Payer: BLUE CROSS/BLUE SHIELD | Source: Ambulatory Visit | Attending: Family | Admitting: Family

## 2014-09-23 DIAGNOSIS — Z0000 Encounter for general adult medical examination without abnormal findings: Secondary | ICD-10-CM

## 2014-09-23 DIAGNOSIS — Z1231 Encounter for screening mammogram for malignant neoplasm of breast: Secondary | ICD-10-CM | POA: Diagnosis not present

## 2014-10-22 ENCOUNTER — Ambulatory Visit (INDEPENDENT_AMBULATORY_CARE_PROVIDER_SITE_OTHER): Payer: BLUE CROSS/BLUE SHIELD | Admitting: Family

## 2014-10-22 ENCOUNTER — Encounter: Payer: Self-pay | Admitting: Family

## 2014-10-22 VITALS — BP 116/77 | HR 66 | Temp 98.3°F | Resp 16 | Ht 64.0 in | Wt 138.0 lb

## 2014-10-22 DIAGNOSIS — N3000 Acute cystitis without hematuria: Secondary | ICD-10-CM | POA: Diagnosis not present

## 2014-10-22 DIAGNOSIS — R300 Dysuria: Secondary | ICD-10-CM

## 2014-10-22 LAB — POCT URINALYSIS DIPSTICK
BILIRUBIN UA: NEGATIVE
Blood, UA: NEGATIVE
Glucose, UA: NEGATIVE
KETONES UA: NEGATIVE
LEUKOCYTES UA: NEGATIVE
NITRITE UA: NEGATIVE
PH UA: 6
PROTEIN UA: NEGATIVE
Spec Grav, UA: 1.015
Urobilinogen, UA: NEGATIVE

## 2014-10-22 MED ORDER — NITROFURANTOIN MONOHYD MACRO 100 MG PO CAPS: 100.0000 mg | ORAL_CAPSULE | Freq: Two times a day (BID) | ORAL | Status: DC

## 2014-10-22 NOTE — Addendum Note (Signed)
Addended by: Kelle Darting A on: 10/22/2014 07:36 AM   Modules accepted: Orders

## 2014-10-22 NOTE — Progress Notes (Signed)
Pre visit review using our clinic review tool, if applicable. No additional management support is needed unless otherwise documented below in the visit note. 

## 2014-10-22 NOTE — Progress Notes (Signed)
Subjective:    Patient ID: Donna Anderson, female    DOB: 1960/03/15, 54 y.o.   MRN: [id number]  HPI  Ms. Donna Anderson is a 54 yr old female who presents today with chief complaint of dysuria and frequency.  She denies known fever.  Denies hematuria, denies back pain.  Symptoms began 5 days ago.  Has not tried any otc medications.  Has hx of kidney stones in the past.     Review of Systems See HPI  Past Medical History  Diagnosis Date  . Arthritis   . History of chicken pox   . Depression   . Diverticulosis 2015    CT SCAN   . Hyperlipidemia   . Kidney stones   . History of migraine   . Graves disease 1997  . Hearing loss     Social History   Social History  . Marital Status: Married    Spouse Name: N/A  . Number of Children: N/A  . Years of Education: N/A   Occupational History  . Not on file.   Social History Main Topics  . Smoking status: Former Smoker -- 15 years    Quit date: 01/16/1999  . Smokeless tobacco: Never Used  . Alcohol Use: 0.0 oz/week    0 Standard drinks or equivalent per week     Comment: occasional  . Drug Use: No  . Sexual Activity: Yes    Birth Control/ Protection: None   Other Topics Concern  . Not on file   Social History Narrative   Married (second marriage) has a 63 yr old son in [place]   Worked in Psychologist, educational   Enjoys exercising, hiking, kayaking.    Completed 12th grade   1 dog, 1 cat          Past Surgical History  Procedure Laterality Date  . Appendectomy  2001  . Oophorectomy Right 2001    due to large ovarian cyst  . Hip fracture surgery Right 2009    fell playing with dog  . Labrum repair Right 2011  . Thyroidectomy  1997  . Endometrial ablation  2003  . Elbow surgery Right 2016    Family History  Problem Relation Age of Onset  . Hyperlipidemia Father   . Hypertension Father   . Heart failure Father 22  . Diabetes Sister   . Arthritis Paternal Grandmother   . Kidney disease Paternal Grandmother   . Diabetes  Paternal Grandmother   . Arthritis Paternal Grandfather     Allergies  Allergen Reactions  . Tramadol Itching    Current Outpatient Prescriptions on File Prior to Visit  Medication Sig Dispense Refill  . levothyroxine (SYNTHROID, LEVOTHROID) 112 MCG tablet TAKE 1 TABLET DAILY BEFORE BREAKFAST 90 tablet 1  . polyethylene glycol (MIRALAX / GLYCOLAX) packet Take 17 g by mouth daily. 14 each 0  . rOPINIRole (REQUIP) 0.25 MG tablet 0.25 mg (1 tab) once daily 1-3 hours before bedtime. Dose may be increased after 2 days to 0.5 mg (2 tabs)daily 60 tablet 2   No current facility-administered medications on file prior to visit.    BP 116/77 mmHg  Pulse 66  Temp(Src) 98.3 F (36.8 C) (Oral)  Resp 16  Ht 5\' 4"  (1.626 m)  Wt 138 lb (62.596 kg)  BMI 23.68 kg/m2  SpO2 99%       Objective:   Physical Exam  Constitutional: She is oriented to person, place, and time. She appears well-developed and well-nourished.  Cardiovascular: Normal  rate, regular rhythm and normal heart sounds.   No murmur heard. Pulmonary/Chest: Effort normal and breath sounds normal. No respiratory distress. She has no wheezes.  Abdominal: Soft. She exhibits no distension. There is no tenderness. There is no rebound and no CVA tenderness.  Musculoskeletal: She exhibits no edema.  Neurological: She is alert and oriented to person, place, and time.  Psychiatric: She has a normal mood and affect. Her behavior is normal. Judgment and thought content normal.          Assessment & Plan:  UTI- UA neg but symptoms consistent with UTI. Will send urine for culture and begin empiric macrodantin.

## 2014-10-22 NOTE — Patient Instructions (Signed)
Start macrodantin for urinary tract. Call if symptoms worsen, if you develop fever, or if symptoms are not improved in 2-3 days.

## 2014-10-23 LAB — URINE CULTURE: Colony Count: 2000

## 2014-10-25 ENCOUNTER — Encounter: Payer: Self-pay | Admitting: Family

## 2015-01-03 ENCOUNTER — Encounter (HOSPITAL_BASED_OUTPATIENT_CLINIC_OR_DEPARTMENT_OTHER): Payer: Self-pay

## 2015-01-03 ENCOUNTER — Encounter: Payer: Self-pay | Admitting: Family

## 2015-01-03 ENCOUNTER — Telehealth: Payer: Self-pay | Admitting: Family

## 2015-01-03 ENCOUNTER — Ambulatory Visit (HOSPITAL_BASED_OUTPATIENT_CLINIC_OR_DEPARTMENT_OTHER)
Admission: RE | Admit: 2015-01-03 | Discharge: 2015-01-03 | Disposition: A | Discharge status: Home / Self Care | Payer: BLUE CROSS/BLUE SHIELD | Source: Ambulatory Visit | Attending: Family | Admitting: Family

## 2015-01-03 ENCOUNTER — Ambulatory Visit (INDEPENDENT_AMBULATORY_CARE_PROVIDER_SITE_OTHER): Payer: BLUE CROSS/BLUE SHIELD | Admitting: Family

## 2015-01-03 VITALS — BP 120/80 | HR 68 | Temp 97.6°F | Resp 14 | Ht 64.0 in | Wt 135.4 lb

## 2015-01-03 DIAGNOSIS — K5732 Diverticulitis of large intestine without perforation or abscess without bleeding: Secondary | ICD-10-CM

## 2015-01-03 DIAGNOSIS — E039 Hypothyroidism, unspecified: Secondary | ICD-10-CM | POA: Diagnosis not present

## 2015-01-03 DIAGNOSIS — R109 Unspecified abdominal pain: Secondary | ICD-10-CM | POA: Insufficient documentation

## 2015-01-03 DIAGNOSIS — R110 Nausea: Secondary | ICD-10-CM | POA: Diagnosis not present

## 2015-01-03 LAB — TSH: TSH: 17.87 u[IU]/mL — ABNORMAL HIGH (ref 0.35–4.50)

## 2015-01-03 MED ORDER — LEVOTHYROXINE SODIUM 112 MCG PO TABS: 112.0000 ug | ORAL_TABLET | Freq: Every day | ORAL | Status: DC

## 2015-01-03 MED ORDER — IOHEXOL 300 MG/ML  SOLN
100.0000 mL | Freq: Once | INTRAMUSCULAR | Status: AC | PRN
  Administered 2015-01-03: 100 mL via INTRAVENOUS

## 2015-01-03 MED ORDER — METRONIDAZOLE 500 MG PO TABS: 500.0000 mg | ORAL_TABLET | Freq: Three times a day (TID) | ORAL | Status: DC

## 2015-01-03 MED ORDER — CIPROFLOXACIN HCL 500 MG PO TABS: 500.0000 mg | ORAL_TABLET | Freq: Two times a day (BID) | ORAL | Status: DC

## 2015-01-03 NOTE — Progress Notes (Signed)
Pre visit review using our clinic review tool, if applicable. No additional management support is needed unless otherwise documented below in the visit note. 

## 2015-01-03 NOTE — Progress Notes (Signed)
Subjective:    Patient ID: Donna Anderson, female    DOB: May 14, 1960, 54 y.o.   MRN: [id number]  HPI  Donna Anderson is a 54 yr old female who presents today with chief complaint of lower abdominal pain.  Pain has been present x 3 days.  Reports hx of diverticulitis.  Denies fever. Last BM was yesterday- normal without blood.  Pain is located down the left side of her abdomen and across the bottom of the abdomen. Denies associated nause.    Hypothyroid-  Lab Results  Component Value Date   TSH 0.97 05/19/2014    Review of Systems    see HPI  Past Medical History  Diagnosis Date  . Arthritis   . History of chicken pox   . Depression   . Diverticulosis 2015    CT SCAN   . Hyperlipidemia   . Kidney stones   . History of migraine   . Graves disease 1997  . Hearing loss     Social History   Social History  . Marital Status: Married    Spouse Name: N/A  . Number of Children: N/A  . Years of Education: N/A   Occupational History  . Not on file.   Social History Main Topics  . Smoking status: Former Smoker -- 15 years    Quit date: 01/16/1999  . Smokeless tobacco: Never Used  . Alcohol Use: 0.0 oz/week    0 Standard drinks or equivalent per week     Comment: occasional  . Drug Use: No  . Sexual Activity: Yes    Birth Control/ Protection: None   Other Topics Concern  . Not on file   Social History Narrative   Married (second marriage) has a 80 yr old son in [place]   Worked in Psychologist, educational   Enjoys exercising, hiking, kayaking.    Completed 12th grade   1 dog, 1 cat          Past Surgical History  Procedure Laterality Date  . Appendectomy  2001  . Oophorectomy Right 2001    due to large ovarian cyst  . Hip fracture surgery Right 2009    fell playing with dog  . Labrum repair Right 2011  . Thyroidectomy  1997  . Endometrial ablation  2003  . Elbow surgery Right 2016    Family History  Problem Relation Age of Onset  . Hyperlipidemia Father   .  Hypertension Father   . Heart failure Father 74  . Diabetes Sister   . Arthritis Paternal Grandmother   . Kidney disease Paternal Grandmother   . Diabetes Paternal Grandmother   . Arthritis Paternal Grandfather     Allergies  Allergen Reactions  . Tramadol Itching    Current Outpatient Prescriptions on File Prior to Visit  Medication Sig Dispense Refill  . levothyroxine (SYNTHROID, LEVOTHROID) 112 MCG tablet TAKE 1 TABLET DAILY BEFORE BREAKFAST 90 tablet 1  . polyethylene glycol (MIRALAX / GLYCOLAX) packet Take 17 g by mouth daily. 14 each 0  . rOPINIRole (REQUIP) 0.25 MG tablet 0.25 mg (1 tab) once daily 1-3 hours before bedtime. Dose may be increased after 2 days to 0.5 mg (2 tabs)daily 60 tablet 2   No current facility-administered medications on file prior to visit.    BP 120/80 mmHg  Pulse 68  Temp(Src) 97.6 F (36.4 C) (Oral)  Resp 14  Ht 5\' 4"  (1.626 m)  Wt 135 lb 6.4 oz (61.417 kg)  BMI 23.23 kg/m2  SpO2 98%    Objective:   Physical Exam  Constitutional: She is oriented to person, place, and time. She appears well-developed and well-nourished.  Eyes: No scleral icterus.  Cardiovascular: Normal rate, regular rhythm and normal heart sounds.   No murmur heard. Pulmonary/Chest: Effort normal and breath sounds normal. No respiratory distress. She has no wheezes.  Abdominal: Soft. Bowel sounds are normal. She exhibits no distension.  + lower abdominal tenderness to palpation, mild tenderness Left side of abdomen  Musculoskeletal: She exhibits no edema.  Neurological: She is alert and oriented to person, place, and time.  Skin: Skin is warm and dry.  Psychiatric: She has a normal mood and affect. Her behavior is normal. Judgment and thought content normal.          Assessment & Plan:  Diverticulitis- pt states she has hx of tubal ligation and not sexually active (no chance of pregnancy). Will obtain CT abd/pelvis to further evaluate and begin cipro/flagyl.

## 2015-01-03 NOTE — Patient Instructions (Addendum)
Please complete lab work prior to leaving.  Complete CT scan on the first floor. Begin Cipro/Flagyl (antibiotics). Call if symptoms worsen or if not improved in 2-3 days.

## 2015-01-03 NOTE — Telephone Encounter (Signed)
Notified pt. She states she only missed one dose on Sunday. Please advise if current dose needs to be adjusted? Scheduled lab appt for 02/14/15 at 8am, future order entered.

## 2015-01-03 NOTE — Telephone Encounter (Signed)
Labs show that she has not been taking synthroid regularly- resume synthroid, follow up 6 weeks for TSH (dx hypothyroid).   Refills sent.

## 2015-01-03 NOTE — Telephone Encounter (Signed)
CT abd/pelvis is negative for diverticulitis.  I wonder if she may have a GI bug which is causing her discomfort.  I would like her to take cipro only bid  X 3 days as this will cover urinary bacteria in case this is contributing. Otherwise, I do not think she needs the metronidazole. Call if symptoms worsen or if not improved in 2-3 days.

## 2015-01-03 NOTE — Telephone Encounter (Signed)
Notified pt and she voices understanding. 

## 2015-01-21 ENCOUNTER — Ambulatory Visit: Payer: BLUE CROSS/BLUE SHIELD | Admitting: Family

## 2015-01-30 ENCOUNTER — Other Ambulatory Visit: Payer: Self-pay | Admitting: Family

## 2015-02-14 ENCOUNTER — Other Ambulatory Visit: Payer: BLUE CROSS/BLUE SHIELD

## 2015-02-16 ENCOUNTER — Other Ambulatory Visit (INDEPENDENT_AMBULATORY_CARE_PROVIDER_SITE_OTHER): Payer: BLUE CROSS/BLUE SHIELD

## 2015-02-16 ENCOUNTER — Other Ambulatory Visit: Payer: Self-pay | Admitting: Family

## 2015-02-16 DIAGNOSIS — E039 Hypothyroidism, unspecified: Secondary | ICD-10-CM

## 2015-02-16 LAB — TSH: TSH: 5.48 u[IU]/mL — ABNORMAL HIGH (ref 0.35–4.50)

## 2015-02-16 MED ORDER — LEVOTHYROXINE SODIUM 125 MCG PO TABS: 125.0000 ug | ORAL_TABLET | Freq: Every day | ORAL | Status: DC

## 2015-02-16 NOTE — Telephone Encounter (Signed)
Attempted to reach pt, left detailed message and sent result via mychart. Future lab order entered.  Rx pended.

## 2015-02-16 NOTE — Telephone Encounter (Signed)
TSH results indicate that synthroid should be increased.  Increase from 112 mcg to 125 mcg.  Follow up tsh in 6 weeks.  Which pharmacy does she prefer?

## 2015-03-22 ENCOUNTER — Encounter: Payer: Self-pay | Admitting: Family Medicine

## 2015-03-22 ENCOUNTER — Ambulatory Visit (INDEPENDENT_AMBULATORY_CARE_PROVIDER_SITE_OTHER): Payer: BLUE CROSS/BLUE SHIELD | Admitting: Family Medicine

## 2015-03-22 VITALS — BP 125/82 | HR 79 | Temp 97.5°F | Resp 20 | Wt 135.0 lb

## 2015-03-22 DIAGNOSIS — J111 Influenza due to unidentified influenza virus with other respiratory manifestations: Secondary | ICD-10-CM

## 2015-03-22 DIAGNOSIS — R69 Illness, unspecified: Principal | ICD-10-CM

## 2015-03-22 MED ORDER — OSELTAMIVIR PHOSPHATE 75 MG PO CAPS: 75.0000 mg | ORAL_CAPSULE | Freq: Two times a day (BID) | ORAL | Status: DC

## 2015-03-22 MED ORDER — ONDANSETRON HCL 4 MG PO TABS: 4.0000 mg | ORAL_TABLET | Freq: Three times a day (TID) | ORAL | Status: DC | PRN

## 2015-03-22 NOTE — Patient Instructions (Signed)
Influenza, Adult Influenza ("the flu") is a viral infection of the respiratory tract. It occurs more often in winter months because people spend more time in close contact with one another. Influenza can make you feel very sick. Influenza easily spreads from person to person (contagious). CAUSES  Influenza is caused by a virus that infects the respiratory tract. You can catch the virus by breathing in droplets from an infected person's cough or sneeze. You can also catch the virus by touching something that was recently contaminated with the virus and then touching your mouth, nose, or eyes. RISKS AND COMPLICATIONS You may be at risk for a more severe case of influenza if you smoke cigarettes, have diabetes, have chronic heart disease (such as heart failure) or lung disease (such as asthma), or if you have a weakened immune system. Elderly people and pregnant women are also at risk for more serious infections. The most common problem of influenza is a lung infection (pneumonia). Sometimes, this problem can require emergency medical care and may be life threatening. SIGNS AND SYMPTOMS  Symptoms typically last 4 to 10 days and may include:  Fever.  Chills.  Headache, body aches, and muscle aches.  Sore throat.  Chest discomfort and cough.  Poor appetite.  Weakness or feeling tired.  Dizziness.  Nausea or vomiting. DIAGNOSIS  Diagnosis of influenza is often made based on your history and a physical exam. A nose or throat swab test can be done to confirm the diagnosis. TREATMENT  In mild cases, influenza goes away on its own. Treatment is directed at relieving symptoms. For more severe cases, your health care provider may prescribe antiviral medicines to shorten the sickness. Antibiotic medicines are not effective because the infection is caused by a virus, not by bacteria. HOME CARE INSTRUCTIONS  Take medicines only as directed by your health care provider.  Use a cool mist humidifier  to make breathing easier.  Get plenty of rest until your temperature returns to normal. This usually takes 3 to 4 days.  Drink enough fluid to keep your urine clear or pale yellow.  Cover yourmouth and nosewhen coughing or sneezing,and wash your handswellto prevent thevirusfrom spreading.  Stay homefromwork orschool untilthe fever is gonefor at least 54full day. PREVENTION  An annual influenza vaccination (flu shot) is the best way to avoid getting influenza. An annual flu shot is now routinely recommended for all adults in the [place] IF:  You experiencechest pain, yourcough worsens,or you producemore mucus.  Youhave nausea,vomiting, ordiarrhea.  Your fever returns or gets worse. SEEK IMMEDIATE MEDICAL CARE IF:  You havetrouble breathing, you become short of breath,or your skin ornails becomebluish.  You have severe painor stiffnessin the neck.  You develop a sudden headache, or pain in the face or ear.  You have nausea or vomiting that you cannot control. MAKE SURE YOU:   Understand these instructions.  Will watch your condition.  Will get help right away if you are not doing well or get worse.   This information is not intended to replace advice given to you by your health care provider. Make sure you discuss any questions you have with your health care provider.   Document Released: 12/30/1999 Document Revised: 01/22/2014 Document Reviewed: 04/02/2011 Elsevier Interactive Patient Education 2016 [employer]. Zofran, tamiflu, Mucinex, tylenol/advil, HYDRATE

## 2015-03-22 NOTE — Progress Notes (Signed)
Patient ID: Donna Anderson, female   DOB: 02/07/1960, 55 y.o.   MRN: [id number]    Donna Anderson , January 05, 1961, 55 y.o., female MRN: [id number]  CC: sore throat Subjective: Pt presents for an acute OV with complaints of sore throat and vomiting of 1 day duration. Associated symptoms include nausea, headache, fatigue and rhinorrhea. Pt has tried mucinex to ease their symptoms. Co-workers are ill.  No fever, chills, abd pain,  diarrhea or rash.  Tdap and flu UTD.  Allergies  Allergen Reactions  . Tramadol Itching   Social History  Substance Use Topics  . Smoking status: Former Smoker -- 15 years    Quit date: 01/16/1999  . Smokeless tobacco: Never Used  . Alcohol Use: 0.0 oz/week    0 Standard drinks or equivalent per week     Comment: occasional   Past Medical History  Diagnosis Date  . Arthritis   . History of chicken pox   . Depression   . Diverticulosis 2015    CT SCAN   . Hyperlipidemia   . Kidney stones   . History of migraine   . Graves disease 1997  . Hearing loss    Past Surgical History  Procedure Laterality Date  . Appendectomy  2001  . Oophorectomy Right 2001    due to large ovarian cyst  . Hip fracture surgery Right 2009    fell playing with dog  . Labrum repair Right 2011  . Thyroidectomy  1997  . Endometrial ablation  2003  . Elbow surgery Right 2016   Family History  Problem Relation Age of Onset  . Hyperlipidemia Father   . Hypertension Father   . Heart failure Father 46  . Diabetes Sister   . Arthritis Paternal Grandmother   . Kidney disease Paternal Grandmother   . Diabetes Paternal Grandmother   . Arthritis Paternal Grandfather      Medication List       This list is accurate as of: 03/22/15  3:08 PM.  Always use your most recent med list.               levothyroxine 125 MCG tablet  Commonly known as:  SYNTHROID  Take 1 tablet (125 mcg total) by mouth daily before breakfast.     polyethylene glycol packet  Commonly known as:   MIRALAX / GLYCOLAX  Take 17 g by mouth daily.     rOPINIRole 0.25 MG tablet  Commonly known as:  REQUIP  0.25 mg (1 tab) once daily 1-3 hours before bedtime. Dose may be increased after 2 days to 0.5 mg (2 tabs)daily       ROS: Negative, with the exception of above mentioned in HPI Objective:  BP 125/82 mmHg  Pulse 79  Temp(Src) 97.5 F (36.4 C)  Resp 20  Wt 135 lb (61.236 kg)  SpO2 99% Body mass index is 23.16 kg/(m^2). Gen: Afebrile. In wheelchair, fatigued. Vomiting in bucket.   HENT: AT. [place]. Bilateral TM visualized and normal in appearance. MMM, no oral lesions. Bilateral nares mild erythema, no swelling. Throat without erythema or exudates. No cough.  Eyes:Pupils Equal Round Reactive to light, Extraocular movements intact,  Conjunctiva without redness, discharge or icterus. Neck/lymp/endocrine: Supple,no  lymphadenopathy CV: RRR  Chest: CTAB, no wheeze or crackles. Good air movement, normal resp effort.  Abd: Soft. NTND. BS present Skin: No rashes, purpura or petechiae.  Neuro:  PERLA. EOMi. Alert. Oriented x3  Assessment/Plan: Donna Anderson is a 55 y.o. female  present for acute OV for  1. Influenza-like illness - ondansetron (ZOFRAN) 4 MG tablet; Take 1 tablet (4 mg total) by mouth every 8 (eight) hours as needed for nausea or vomiting.  Dispense: 30 tablet; Refill: 0 - oseltamivir (TAMIFLU) 75 MG capsule; Take 1 capsule (75 mg total) by mouth 2 (two) times daily.  Dispense: 10 capsule; Refill: 0 - Mucinex, tylenol/advil, rest, HYDRATE  electronically signed by:  Howard Pouch, DO  [place]

## 2015-03-30 ENCOUNTER — Other Ambulatory Visit (INDEPENDENT_AMBULATORY_CARE_PROVIDER_SITE_OTHER): Payer: BLUE CROSS/BLUE SHIELD

## 2015-03-30 DIAGNOSIS — E039 Hypothyroidism, unspecified: Secondary | ICD-10-CM

## 2015-03-31 LAB — TSH: TSH: 1.7 u[IU]/mL (ref 0.35–4.50)

## 2015-04-22 ENCOUNTER — Encounter: Payer: Self-pay | Admitting: Family

## 2015-04-22 ENCOUNTER — Other Ambulatory Visit: Payer: Self-pay | Admitting: Family

## 2015-06-21 ENCOUNTER — Encounter: Payer: Self-pay | Admitting: Family

## 2015-06-21 MED ORDER — LEVOTHYROXINE SODIUM 125 MCG PO TABS: ORAL_TABLET | ORAL | Status: DC

## 2015-06-21 NOTE — Telephone Encounter (Signed)
Donna Anderson-- we last saw pt in 12/2014. She had TSH in 03/2015 and has no future appts scheduled with [place]. When should pt f/u in the office?

## 2015-06-27 ENCOUNTER — Encounter: Payer: Self-pay | Admitting: Medical

## 2015-06-27 ENCOUNTER — Ambulatory Visit (HOSPITAL_BASED_OUTPATIENT_CLINIC_OR_DEPARTMENT_OTHER)
Admission: RE | Admit: 2015-06-27 | Discharge: 2015-06-27 | Disposition: A | Discharge status: Home / Self Care | Payer: BLUE CROSS/BLUE SHIELD | Source: Ambulatory Visit | Attending: Medical | Admitting: Medical

## 2015-06-27 ENCOUNTER — Ambulatory Visit (INDEPENDENT_AMBULATORY_CARE_PROVIDER_SITE_OTHER): Payer: BLUE CROSS/BLUE SHIELD | Admitting: Medical

## 2015-06-27 VITALS — BP 111/75 | HR 66 | Temp 98.1°F | Resp 16 | Ht 64.0 in | Wt 132.0 lb

## 2015-06-27 DIAGNOSIS — M25551 Pain in right hip: Secondary | ICD-10-CM | POA: Insufficient documentation

## 2015-06-27 DIAGNOSIS — M25552 Pain in left hip: Secondary | ICD-10-CM | POA: Insufficient documentation

## 2015-06-27 DIAGNOSIS — M25559 Pain in unspecified hip: Secondary | ICD-10-CM

## 2015-06-27 MED ORDER — DICLOFENAC SODIUM 75 MG PO TBEC: 75.0000 mg | DELAYED_RELEASE_TABLET | Freq: Two times a day (BID) | ORAL | Status: DC

## 2015-06-27 MED FILL — DICLOFENAC SOD EC 75 MG TAB: 75 | 15 days supply | Qty: 30 | Fill #0

## 2015-06-27 NOTE — Progress Notes (Signed)
   Subjective:    Patient ID: Donna Anderson, female    DOB: 11-05-1960, 55 y.o.   MRN: [id number]  HPI  Pt in with some bilateral hip pain. Worse on lt side. Some mild pain on rt side. Pt states since 2008 some pain on and off. Pt has known labrum tear. Hx of repair in the past. Pt states possible another tear. Pt states for about 2-3 moderate left hip pain. But last 4 weeks moderate to severe pain. Worst pain on getting up and ambulating. Also pain laying on her sides on her hips.  Pt has level 7/10 pain at time on her left hip.    Review of Systems  Constitutional: Positive for fatigue. Negative for fever and chills.  Respiratory: Negative for cough, chest tightness and wheezing.   Cardiovascular: Negative for chest pain and palpitations.  Musculoskeletal: Negative for myalgias and neck pain.       Hip pain  Skin: Negative for rash.  Hematological: Negative for adenopathy. Does not bruise/bleed easily.       Objective:   Physical Exam  General- No acute distress. Pleasant patient. Lungs- Clear, even and unlabored. Heart- regular rate and rhythm. Neurologic- CNII- XII grossly intact.  Rt  Hip- on rom no pain. Lt hip- on rom moderate pain  on rotation.(she describes deep groin pain)      Assessment & Plan:  For your hip pain use diclofenac. If you need take your tramadol  We will get xr-ays of both hips to view joint spaces. If severe narrowing or persisting pain refer back to orthopedist.  Follow up in 2 weeks or as needed.

## 2015-06-27 NOTE — Progress Notes (Signed)
Pre visit review using our clinic review tool, if applicable. No additional management support is needed unless otherwise documented below in the visit note. 

## 2015-06-27 NOTE — Patient Instructions (Signed)
For your hip pain use diclofenac. If you need take your tramadol  We will get xr-ays of both hips to view joint spaces. If severe narrowing or persisting pain refer back to orthopedist.  Follow up in 2 weeks or as needed.

## 2015-07-11 ENCOUNTER — Ambulatory Visit (INDEPENDENT_AMBULATORY_CARE_PROVIDER_SITE_OTHER): Payer: BLUE CROSS/BLUE SHIELD | Admitting: Internal Medicine

## 2015-07-11 ENCOUNTER — Encounter: Payer: Self-pay | Admitting: Family

## 2015-07-11 ENCOUNTER — Telehealth: Payer: Self-pay | Admitting: Family

## 2015-07-11 ENCOUNTER — Encounter: Payer: Self-pay | Admitting: Internal Medicine

## 2015-07-11 VITALS — BP 118/68 | HR 71 | Temp 98.1°F | Ht 64.0 in | Wt 134.1 lb

## 2015-07-11 DIAGNOSIS — T7840XA Allergy, unspecified, initial encounter: Secondary | ICD-10-CM | POA: Diagnosis not present

## 2015-07-11 MED ORDER — PREDNISONE 10 MG PO TABS: ORAL_TABLET | ORAL | Status: DC

## 2015-07-11 MED FILL — predniSONE 10 MG TABS: 10 | 8 days supply | Qty: 20 | Fill #0

## 2015-07-11 NOTE — Patient Instructions (Signed)
Take prednisone as prescribed  For itch: claritin OTC 10 mg once a day Zantac OTC 75 mg twice a day  Call if no better in few days  Avoidance!!!

## 2015-07-11 NOTE — Telephone Encounter (Signed)
Patient Name: EVARISTA REICHENBERGER DOB: November 19, 1960 Initial Comment stung by wasp on saturday, foot is swollen, itches, painful Nurse Assessment Nurse: Vallery Sa, RN, Cathy Date/Time (Eastern Time): 07/11/2015 12:07:14 PM Confirm and document reason for call. If symptomatic, describe symptoms. You must click the next button to save text entered. ---Caller states her left foot was strung wasp two days ago and has increased pain with s. No severe breathing or swallowing difficulty. No fever. Has the patient traveled out of the country within the last 30 days? ---No Does the patient have any new or worsening symptoms? ---Yes Will a triage be completed? ---Yes Related visit to physician within the last 2 weeks? ---No Does the PT have any chronic conditions? (i.e. diabetes, asthma, etc.) ---Yes List chronic conditions. ---Low Thyroid Is the patient pregnant or possibly pregnant? (Ask all females between the ages of 60-55) ---No Is this a behavioral health or substance abuse call? ---No Guidelines Guideline Title Affirmed Question Affirmed Notes Bee or [place] Sting [1] Widespread hives, itching or facial swelling AND [2] started within 2 hours of sting (Exception: only at site of sting) Final Disposition User Go to ED Now [place], RN, Tye [place] Comments During the triage process Ave shared that she has two additional wasp stings on her back and she developed widespread itching within two hours of the stings. She has been taking Benadryl. Zaylie declined the Go to ER disposition. Reinforced the Go to ER disposition and Francene shares she just can't afford the ER. She requests further direction from the MD. Called the office backline and notified Christy. [employer] and Jaimie. Referrals GO TO FACILITY REFUSED Disagree/Comply: Disagree Disagree/Comply Reason: Disagree with instructions

## 2015-07-11 NOTE — Progress Notes (Signed)
Subjective:    Patient ID: Donna Anderson, female    DOB: 1961-01-09, 55 y.o.   MRN: [id number]  DOS:  07/11/2015 Type of visit - description : acute Interval history: Was stung by wasps on the left foot, L elbow , low back, severe itching and pain since then. This happened a few days ago. Using Benadryl oral and cream. Symptoms barely responding. Redness around the left elbow is decreased compared to yesterday   Review of Systems Denies fever chills No generalized itching, lip or tongue swelling  Past Medical History  Diagnosis Date  . Arthritis   . History of chicken pox   . Depression   . Diverticulosis 2015    CT SCAN   . Hyperlipidemia   . Kidney stones   . History of migraine   . Graves disease 1997  . Hearing loss     Past Surgical History  Procedure Laterality Date  . Appendectomy  2001  . Oophorectomy Right 2001    due to large ovarian cyst  . Hip fracture surgery Right 2009    fell playing with dog  . Labrum repair Right 2011  . Thyroidectomy  1997  . Endometrial ablation  2003  . Elbow surgery Right 2016    Social History   Social History  . Marital Status: Married    Spouse Name: N/A  . Number of Children: N/A  . Years of Education: N/A   Occupational History  . Not on file.   Social History Main Topics  . Smoking status: Former Smoker -- 15 years    Quit date: 01/16/1999  . Smokeless tobacco: Never Used  . Alcohol Use: 0.0 oz/week    0 Standard drinks or equivalent per week     Comment: occasional  . Drug Use: No  . Sexual Activity: Yes    Birth Control/ Protection: None   Other Topics Concern  . Not on file   Social History Narrative   Married (second marriage) has a 47 yr old son in [place]   Worked in Psychologist, educational   Enjoys exercising, hiking, kayaking.    Completed 12th grade   1 dog, 1 cat              Medication List       This list is accurate as of: 07/11/15  2:34 PM.  Always use your most recent med list.               diclofenac 75 MG EC tablet  Commonly known as:  VOLTAREN  Take 1 tablet (75 mg total) by mouth 2 (two) times daily.     levothyroxine 125 MCG tablet  Commonly known as:  SYNTHROID, LEVOTHROID  TAKE ONE TABLET BY MOUTH ONCE DAILY BEFORE BREAKFAST           Objective:   Physical Exam  Skin:      BP 118/68 mmHg  Pulse 71  Temp(Src) 98.1 F (36.7 C) (Oral)  Ht 5\' 4"  (1.626 m)  Wt 134 lb 2 oz (60.839 kg)  BMI 23.01 kg/m2  SpO2 97% General:   Well developed, well nourished . NAD.  HEENT:  Normocephalic . Face symmetric, atraumatic Neurologic:  alert & oriented X3.  Speech normal, gait appropriate for age and unassisted Psych--  Cognition and judgment appear intact.  Cooperative with normal attention span and concentration.  Behavior appropriate. No anxious or depressed appearing.      Assessment & Plan:   Local allergic reaction to  wasps sting. Recommend to stop Benadryl and try Claritin, Zantac. Will prescribe prednisone . Also OTC hydrocortisone cream. Most importance recommend avoidance of  wasps. Call if no better.

## 2015-07-11 NOTE — Progress Notes (Signed)
Pre visit review using our clinic review tool, if applicable. No additional management support is needed unless otherwise documented below in the visit note. 

## 2015-07-12 NOTE — Telephone Encounter (Signed)
Noted. Pt was seen by Dr. Larose Kells yesterday (07/11/15).

## 2015-07-25 ENCOUNTER — Encounter: Payer: Self-pay | Admitting: Family

## 2015-07-26 ENCOUNTER — Ambulatory Visit (INDEPENDENT_AMBULATORY_CARE_PROVIDER_SITE_OTHER): Payer: BLUE CROSS/BLUE SHIELD | Admitting: Family

## 2015-07-26 ENCOUNTER — Encounter: Payer: Self-pay | Admitting: Family

## 2015-07-26 VITALS — BP 102/80 | HR 79 | Temp 97.8°F | Ht 63.0 in | Wt 137.0 lb

## 2015-07-26 DIAGNOSIS — R1013 Epigastric pain: Secondary | ICD-10-CM | POA: Diagnosis not present

## 2015-07-26 MED ORDER — SUCRALFATE 1 GM/10ML PO SUSP: 1.0000 g | Freq: Three times a day (TID) | ORAL | Status: DC

## 2015-07-26 MED ORDER — OMEPRAZOLE 40 MG PO CPDR: 40.0000 mg | DELAYED_RELEASE_CAPSULE | Freq: Every day | ORAL | Status: DC

## 2015-07-26 MED FILL — SUCRALFATE 1 GM TABLET: 1 | 11 days supply | Qty: 42 | Fill #0

## 2015-07-26 MED FILL — OMEPRAZOLE DR 40 MG CAPSULE: 40 | 30 days supply | Qty: 30 | Fill #0

## 2015-07-26 NOTE — Patient Instructions (Signed)
You will be contacted about your ultrasound to check your gallbladder and your referral to GI. Please continue prilosec 40mg  once daily. Complete blood work prior to leaving. Follow up in 1 month.

## 2015-07-26 NOTE — Progress Notes (Addendum)
Subjective:    Patient ID: Donna Anderson, female    DOB: [birth year], 55 y.o.   MRN: [id number]  HPI  Donna Anderson is a 55 yr old female who presents today to discuss epigastric burning pain.  Reports that pain began 2 days ago.  Tried pepto bismol, pepcid, chewable antacids and prilosec without improvements.  She is eating/drinking without difficulty.  Denies dysphagia, nausea/vomitting.  Pain is non-radiating.  Has occasional "little pain across my back."  Bowels are normal.   She denies current NSAID use. Denies black or bloody stools.  She reports that yesterday and today she has felt more winded with activities such as climbing the stairs.   Review of Systems See HPI  Past Medical History  Diagnosis Date  . Arthritis   . History of chicken pox   . Depression   . Diverticulosis 2015    CT SCAN   . Hyperlipidemia   . Kidney stones   . History of migraine   . Graves disease 1997  . Hearing loss      Social History   Social History  . Marital Status: Married    Spouse Name: N/A  . Number of Children: N/A  . Years of Education: N/A   Occupational History  . Not on file.   Social History Main Topics  . Smoking status: Former Smoker -- 15 years    Quit date: 01/16/1999  . Smokeless tobacco: Never Used  . Alcohol Use: 0.0 oz/week    0 Standard drinks or equivalent per week     Comment: occasional  . Drug Use: No  . Sexual Activity: Yes    Birth Control/ Protection: None   Other Topics Concern  . Not on file   Social History Narrative   Married (second marriage) has a 56 yr old son in [place]   Worked in Psychologist, educational   Enjoys exercising, hiking, kayaking.    Completed 12th grade   1 dog, 1 cat          Past Surgical History  Procedure Laterality Date  . Appendectomy  2001  . Oophorectomy Right 2001    due to large ovarian cyst  . Hip fracture surgery Right 2009    fell playing with dog  . Labrum repair Right 2011  . Thyroidectomy  1997  . Endometrial  ablation  2003  . Elbow surgery Right 2016    Family History  Problem Relation Age of Onset  . Hyperlipidemia Father   . Hypertension Father   . Heart failure Father 71  . Diabetes Sister   . Arthritis Paternal Grandmother   . Kidney disease Paternal Grandmother   . Diabetes Paternal Grandmother   . Arthritis Paternal Grandfather     Allergies  Allergen Reactions  . Tramadol Itching    Current Outpatient Prescriptions on File Prior to Visit  Medication Sig Dispense Refill  . levothyroxine (SYNTHROID, LEVOTHROID) 125 MCG tablet TAKE ONE TABLET BY MOUTH ONCE DAILY BEFORE BREAKFAST 90 tablet 1  . diclofenac (VOLTAREN) 75 MG EC tablet Take 1 tablet (75 mg total) by mouth 2 (two) times daily. (Patient not taking: Reported on 07/26/2015) 30 tablet 0  . predniSONE (DELTASONE) 10 MG tablet 4 tablets x 2 days, 3 tabs x 2 days, 2 tabs x 2 days, 1 tab x 2 days (Patient not taking: Reported on 07/26/2015) 20 tablet 0   No current facility-administered medications on file prior to visit.    BP 102/80 mmHg  Pulse  79  Temp(Src) 97.8 F (36.6 C) (Oral)  Ht 5\' 3"  (1.6 m)  Wt 137 lb (62.143 kg)  BMI 24.27 kg/m2  SpO2 98%       Objective:   Physical Exam  Constitutional: She appears well-developed and well-nourished.  Cardiovascular: Normal rate, regular rhythm and normal heart sounds.   No murmur heard. Pulmonary/Chest: Effort normal and breath sounds normal. No respiratory distress. She has no wheezes.  Abdominal: Soft. She exhibits no distension and no mass. There is tenderness in the epigastric area. There is no rebound and no guarding.  Psychiatric: She has a normal mood and affect. Her behavior is normal. Judgment and thought content normal.          Assessment & Plan:  Epigastric abdominal pain- differential includes gastritis/ulcer/gerd, cholecystitis, pancreatitis. Will obtain abdominal US, H pylori antibody, CMET, CBC to further evaluate.  Increase prilosec from 20 to  40mg . Add carafate trial.  Pt is advised to call if symptoms worsen or do not improve.  Patient was given GI cocktail today in the clinic.  Will also initiate GI referral.   Debbrah Alar NP

## 2015-07-26 NOTE — Progress Notes (Signed)
Pre visit review using our clinic review tool, if applicable. No additional management support is needed unless otherwise documented below in the visit note. 

## 2015-07-27 LAB — CBC WITH DIFFERENTIAL/PLATELET
Basophils Absolute: 0.1 10*3/uL (ref 0.0–0.1)
Basophils Relative: 0.9 % (ref 0.0–3.0)
Eosinophils Absolute: 0.2 10*3/uL (ref 0.0–0.7)
Eosinophils Relative: 2.1 % (ref 0.0–5.0)
HCT: 41.2 % (ref 36.0–46.0)
Hemoglobin: 14 g/dL (ref 12.0–15.0)
Lymphocytes Relative: 30.1 % (ref 12.0–46.0)
Lymphs Abs: 2.3 10*3/uL (ref 0.7–4.0)
MCHC: 34 g/dL (ref 30.0–36.0)
MCV: 89.7 fl (ref 78.0–100.0)
Monocytes Absolute: 0.4 10*3/uL (ref 0.1–1.0)
Monocytes Relative: 4.7 % (ref 3.0–12.0)
Neutro Abs: 4.8 10*3/uL (ref 1.4–7.7)
Neutrophils Relative %: 62.2 % (ref 43.0–77.0)
Platelets: 243 10*3/uL (ref 150.0–400.0)
RBC: 4.59 Mil/uL (ref 3.87–5.11)
RDW: 12.5 % (ref 11.5–15.5)
WBC: 7.7 10*3/uL (ref 4.0–10.5)

## 2015-07-27 LAB — LIPASE: LIPASE: 141 U/L — AB (ref 11.0–59.0)

## 2015-07-27 LAB — COMPREHENSIVE METABOLIC PANEL
ALT: 50 U/L — ABNORMAL HIGH (ref 0–35)
AST: 22 U/L (ref 0–37)
Albumin: 4.4 g/dL (ref 3.5–5.2)
Alkaline Phosphatase: 98 U/L (ref 39–117)
BUN: 17 mg/dL (ref 6–23)
CO2: 28 mEq/L (ref 19–32)
Calcium: 9.1 mg/dL (ref 8.4–10.5)
Chloride: 102 mEq/L (ref 96–112)
Creatinine, Ser: 0.86 mg/dL (ref 0.40–1.20)
GFR: 73.12 mL/min (ref >60.00)
Glucose, Bld: 132 mg/dL — ABNORMAL HIGH (ref 70–99)
Potassium: 5 mEq/L (ref 3.5–5.1)
Sodium: 140 mEq/L (ref 135–145)
Total Bilirubin: 0.3 mg/dL (ref 0.2–1.2)
Total Protein: 7.1 g/dL (ref 6.0–8.3)

## 2015-07-27 LAB — H. PYLORI ANTIBODY, IGG: H PYLORI IGG: NEGATIVE

## 2015-07-28 ENCOUNTER — Telehealth: Payer: Self-pay | Admitting: Family

## 2015-07-28 DIAGNOSIS — K8500 Idiopathic acute pancreatitis without necrosis or infection: Secondary | ICD-10-CM

## 2015-07-28 NOTE — Telephone Encounter (Signed)
Ultrasound canceled, order sent to Imaging to schedule CT

## 2015-07-28 NOTE — Telephone Encounter (Signed)
Spoke with patient. Discussed lab work consistent with pancreatitis. Advised pt to change to clear liquid diet x 2 days, then full liquid for 1-2 days, then bland diet advance as tolerated.  Offered pain med, she declines. Advised pt that I would like her to complete a CT of her abdomen. 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 Advised pt to call if symptoms worsen. Otherwise, I have asked her to follow up with me in 1 week.    Anderson Malta, would you please cancel ultrasound?  I am placing CT instead for tomorrow.

## 2015-07-29 ENCOUNTER — Ambulatory Visit (HOSPITAL_BASED_OUTPATIENT_CLINIC_OR_DEPARTMENT_OTHER): Admission: RE | Admit: 2015-07-29 | Payer: BLUE CROSS/BLUE SHIELD | Source: Ambulatory Visit

## 2015-07-29 ENCOUNTER — Ambulatory Visit (HOSPITAL_BASED_OUTPATIENT_CLINIC_OR_DEPARTMENT_OTHER)
Admission: RE | Admit: 2015-07-29 | Discharge: 2015-07-29 | Disposition: A | Discharge status: Home / Self Care | Payer: BLUE CROSS/BLUE SHIELD | Source: Ambulatory Visit | Attending: Family | Admitting: Family

## 2015-07-29 ENCOUNTER — Encounter (HOSPITAL_BASED_OUTPATIENT_CLINIC_OR_DEPARTMENT_OTHER): Payer: Self-pay

## 2015-07-29 DIAGNOSIS — K8500 Idiopathic acute pancreatitis without necrosis or infection: Secondary | ICD-10-CM

## 2015-07-29 DIAGNOSIS — R1013 Epigastric pain: Secondary | ICD-10-CM | POA: Diagnosis not present

## 2015-07-29 DIAGNOSIS — I700 Atherosclerosis of aorta: Secondary | ICD-10-CM | POA: Insufficient documentation

## 2015-07-29 MED ORDER — IOPAMIDOL (ISOVUE-300) INJECTION 61%
100.0000 mL | Freq: Once | INTRAVENOUS | Status: AC | PRN
  Administered 2015-07-29: 100 mL via INTRAVENOUS

## 2015-07-30 ENCOUNTER — Encounter: Payer: Self-pay | Admitting: Family

## 2015-07-30 DIAGNOSIS — K760 Fatty (change of) liver, not elsewhere classified: Secondary | ICD-10-CM | POA: Insufficient documentation

## 2015-10-31 ENCOUNTER — Encounter: Payer: Self-pay | Admitting: Family

## 2015-10-31 ENCOUNTER — Ambulatory Visit (INDEPENDENT_AMBULATORY_CARE_PROVIDER_SITE_OTHER): Payer: BLUE CROSS/BLUE SHIELD | Admitting: Family

## 2015-10-31 VITALS — BP 126/75 | HR 70 | Temp 98.2°F | Resp 16 | Ht 63.0 in | Wt 138.4 lb

## 2015-10-31 DIAGNOSIS — F419 Anxiety disorder, unspecified: Secondary | ICD-10-CM

## 2015-10-31 DIAGNOSIS — Z23 Encounter for immunization: Secondary | ICD-10-CM | POA: Diagnosis not present

## 2015-10-31 DIAGNOSIS — M25552 Pain in left hip: Secondary | ICD-10-CM | POA: Diagnosis not present

## 2015-10-31 DIAGNOSIS — F418 Other specified anxiety disorders: Secondary | ICD-10-CM | POA: Diagnosis not present

## 2015-10-31 DIAGNOSIS — E039 Hypothyroidism, unspecified: Secondary | ICD-10-CM | POA: Diagnosis not present

## 2015-10-31 DIAGNOSIS — F329 Major depressive disorder, single episode, unspecified: Secondary | ICD-10-CM

## 2015-10-31 MED ORDER — MELOXICAM 7.5 MG PO TABS: 7.5000 mg | ORAL_TABLET | Freq: Every day | ORAL | 0 refills | Status: DC

## 2015-10-31 MED FILL — MELOXICAM 7.5 MG TABLET: 7.5 | 14 days supply | Qty: 14 | Fill #0

## 2015-10-31 NOTE — Progress Notes (Signed)
Subjective:    Patient ID: Donna Anderson, female    DOB: 14-Mar-1960, 55 y.o.   MRN: [id number]  HPI  Donna Anderson is a 55 yr old female who presents today for follow up.  1) Hypothyroid- she reports that she has missed a few doses.   Lab Results  Component Value Date   TSH 1.70 03/30/2015   2)Anxiety/depression- reports that her mood is good. Mild work stress.   3) L hip pain- has been present x 1 year. Reports that she is on her feet 12 hours a day at work.  Review of Systems See HPI  Past Medical History:  Diagnosis Date  . Arthritis   . Depression   . Diverticulosis 2015   CT SCAN   . Graves disease 1997  . Hearing loss   . History of chicken pox   . History of migraine   . Hyperlipidemia   . Kidney stones      Social History   Social History  . Marital status: Married    Spouse name: N/A  . Number of children: N/A  . Years of education: N/A   Occupational History  . Not on file.   Social History Main Topics  . Smoking status: Former Smoker    Years: 15.00    Quit date: 01/16/1999  . Smokeless tobacco: Never Used  . Alcohol use 0.0 oz/week     Comment: occasional  . Drug use: No  . Sexual activity: Yes    Birth control/ protection: None   Other Topics Concern  . Not on file   Social History Narrative   Married (second marriage) has a 5 yr old son in [place]   Worked in Psychologist, educational   Enjoys exercising, hiking, kayaking.    Completed 12th grade   1 dog, 1 cat          Past Surgical History:  Procedure Laterality Date  . APPENDECTOMY  2001  . ELBOW SURGERY Right 2016  . ENDOMETRIAL ABLATION  2003  . HIP FRACTURE SURGERY Right 2009   fell playing with dog  . labrum repair Right 2011  . OOPHORECTOMY Right 2001   due to large ovarian cyst  . THYROIDECTOMY  1997    Family History  Problem Relation Age of Onset  . Hyperlipidemia Father   . Hypertension Father   . Heart failure Father 75  . Diabetes Sister   . Arthritis Paternal  Grandmother   . Kidney disease Paternal Grandmother   . Diabetes Paternal Grandmother   . Arthritis Paternal Grandfather     Allergies  Allergen Reactions  . Tramadol Itching    Current Outpatient Prescriptions on File Prior to Visit  Medication Sig Dispense Refill  . levothyroxine (SYNTHROID, LEVOTHROID) 125 MCG tablet TAKE ONE TABLET BY MOUTH ONCE DAILY BEFORE BREAKFAST 90 tablet 1   No current facility-administered medications on file prior to visit.     BP 126/75 (BP Location: Right Arm, Patient Position: Sitting, Cuff Size: Normal)   Pulse 70   Temp 98.2 F (36.8 C) (Oral)   Resp 16   Ht 5\' 3"  (1.6 m)   Wt 138 lb 6.4 oz (62.8 kg)   SpO2 98% Comment: room air.  BMI 24.52 kg/m       Objective:   Physical Exam  Constitutional: She is oriented to person, place, and time. She appears well-developed and well-nourished.  Cardiovascular: Normal rate, regular rhythm and normal heart sounds.   No murmur heard. Pulmonary/Chest:  Effort normal and breath sounds normal. No respiratory distress. She has no wheezes.  Musculoskeletal:  Full ROM of the left hip, + pain with left hip abduction.   Lymphadenopathy:    She has no cervical adenopathy.  Neurological: She is alert and oriented to person, place, and time.  Psychiatric: She has a normal mood and affect. Her behavior is normal. Judgment and thought content normal.          Assessment & Plan:  L hip pain- new complaint. rx with meloxicam and will refer to sports medicine for further evaluation.

## 2015-10-31 NOTE — Assessment & Plan Note (Signed)
Clinically stable on synthroid, obtain follow up TSH.  Continue current dose of synthroid.

## 2015-10-31 NOTE — Progress Notes (Signed)
Pre visit review using our clinic review tool, if applicable. No additional management support is needed unless otherwise documented below in the visit note. 

## 2015-10-31 NOTE — Assessment & Plan Note (Signed)
Clinically stable off of meds.

## 2015-10-31 NOTE — Patient Instructions (Signed)
Please complete lab work prior to leaving. You will be contacted about your referral to Dr. Barbaraann Barthel- sports medicine. In the meantime, please begin meloxicam (7.5mg  once daily).

## 2015-11-01 LAB — TSH: TSH: 0.5 u[IU]/mL (ref 0.35–4.50)

## 2015-11-07 ENCOUNTER — Ambulatory Visit: Payer: BLUE CROSS/BLUE SHIELD | Admitting: Family Medicine

## 2015-11-09 ENCOUNTER — Ambulatory Visit: Payer: BLUE CROSS/BLUE SHIELD | Admitting: Family Medicine

## 2015-11-09 ENCOUNTER — Encounter: Payer: Self-pay | Admitting: Family

## 2015-11-09 MED ORDER — LORAZEPAM 0.5 MG PO TABS: 0.5000 mg | ORAL_TABLET | Freq: Two times a day (BID) | ORAL | 0 refills | Status: DC | PRN

## 2015-11-10 ENCOUNTER — Ambulatory Visit (INDEPENDENT_AMBULATORY_CARE_PROVIDER_SITE_OTHER): Payer: BLUE CROSS/BLUE SHIELD | Admitting: Family Medicine

## 2015-11-10 ENCOUNTER — Other Ambulatory Visit: Payer: BLUE CROSS/BLUE SHIELD

## 2015-11-10 ENCOUNTER — Encounter: Payer: Self-pay | Admitting: Family Medicine

## 2015-11-10 DIAGNOSIS — M25552 Pain in left hip: Secondary | ICD-10-CM

## 2015-11-10 MED FILL — LORazepam 0.5 MG TABS: 0.5 | 15 days supply | Qty: 30 | Fill #0

## 2015-11-10 NOTE — Patient Instructions (Addendum)
You have a labral tear of your left hip. Take meloxicam as prescribed by your PCP. Physical therapy is very important - do this and home exercises on days you don't go to therapy. We could consider an injection, different anti-inflammatory, MR arthrogram if not improving as expected. Follow up with me in 6 weeks for reevaluation.

## 2015-11-14 DIAGNOSIS — M25552 Pain in left hip: Secondary | ICD-10-CM | POA: Insufficient documentation

## 2015-11-14 NOTE — Assessment & Plan Note (Signed)
independently reviewed radiographs from June and no evidence arthritis or other bony abnormalities.  Exam fits with hip intraarticular pathology however, most likely from a labral tear.  She will continue with meloxicam and start physical therapy with labral tear protocol.  Consider intraarticular injection, MRI arthrogram if not improving at follow up in 6 weeks.

## 2015-11-14 NOTE — Progress Notes (Signed)
PCP: Nance Pear., NP  Subjective:   HPI: Patient is a 55 y.o. female here for left hip pain.  Patient reports she's had left hip/groin pain for 1 year. No known injury or trauma. Pain worse after working 12 hour shifts. Difficulty lying on left side. Pain is 6/10, sharp, radiates some into thigh. No numbness or tingling. No skin changes. Taking meloxicam recently with mild benefit.  Past Medical History:  Diagnosis Date  . Arthritis   . Depression   . Diverticulosis 2015   CT SCAN   . Graves disease 1997  . Hearing loss   . History of chicken pox   . History of migraine   . Hyperlipidemia   . Kidney stones     Current Outpatient Prescriptions on File Prior to Visit  Medication Sig Dispense Refill  . levothyroxine (SYNTHROID, LEVOTHROID) 125 MCG tablet TAKE ONE TABLET BY MOUTH ONCE DAILY BEFORE BREAKFAST 90 tablet 1  . LORazepam (ATIVAN) 0.5 MG tablet Take 1 tablet (0.5 mg total) by mouth 2 (two) times daily as needed for anxiety. 30 tablet 0  . meloxicam (MOBIC) 7.5 MG tablet Take 1 tablet (7.5 mg total) by mouth daily. 14 tablet 0   No current facility-administered medications on file prior to visit.     Past Surgical History:  Procedure Laterality Date  . APPENDECTOMY  2001  . ELBOW SURGERY Right 2016  . ENDOMETRIAL ABLATION  2003  . HIP FRACTURE SURGERY Right 2009   fell playing with dog  . labrum repair Right 2011  . OOPHORECTOMY Right 2001   due to large ovarian cyst  . THYROIDECTOMY  1997    Allergies  Allergen Reactions  . Tramadol Itching    Social History   Social History  . Marital status: Married    Spouse name: N/A  . Number of children: N/A  . Years of education: N/A   Occupational History  . Not on file.   Social History Main Topics  . Smoking status: Former Smoker    Years: 15.00    Quit date: 01/16/1999  . Smokeless tobacco: Never Used  . Alcohol use 0.0 oz/week     Comment: occasional  . Drug use: No  . Sexual  activity: Yes    Birth control/ protection: None   Other Topics Concern  . Not on file   Social History Narrative   Married (second marriage) has a 45 yr old son in [place]   Worked in Psychologist, educational   Enjoys exercising, hiking, kayaking.    Completed 12th grade   1 dog, 1 cat          Family History  Problem Relation Age of Onset  . Hyperlipidemia Father   . Hypertension Father   . Heart failure Father 79  . Diabetes Sister   . Arthritis Paternal Grandmother   . Kidney disease Paternal Grandmother   . Diabetes Paternal Grandmother   . Arthritis Paternal Grandfather     BP 133/86   Pulse 71   Ht 5\' 3"  (1.6 m)   Wt 138 lb (62.6 kg)   BMI 24.45 kg/m   Review of Systems: See HPI above.    Objective:  Physical Exam:  Gen: NAD, comfortable in exam room  Back/left hip: No gross deformity, scoliosis. No TTP greater trochanter, elsewhere about hip or back.  No midline or bony TTP. FROM with pain on hip external and internal rotation. Strength LEs 5/5 all muscle groups.   Negative SLRs. Sensation intact to  light touch bilaterally. Positive logroll left hip.  Negative right.   Negative fabers and piriformis stretches.    Assessment & Plan:  1. Left hip pain - independently reviewed radiographs from June and no evidence arthritis or other bony abnormalities.  Exam fits with hip intraarticular pathology however, most likely from a labral tear.  She will continue with meloxicam and start physical therapy with labral tear protocol.  Consider intraarticular injection, MRI arthrogram if not improving at follow up in 6 weeks.

## 2015-11-15 DIAGNOSIS — M6281 Muscle weakness (generalized): Secondary | ICD-10-CM | POA: Diagnosis not present

## 2015-11-15 DIAGNOSIS — M25551 Pain in right hip: Secondary | ICD-10-CM | POA: Diagnosis not present

## 2015-11-15 DIAGNOSIS — R262 Difficulty in walking, not elsewhere classified: Secondary | ICD-10-CM | POA: Diagnosis not present

## 2015-11-15 DIAGNOSIS — M25552 Pain in left hip: Secondary | ICD-10-CM | POA: Diagnosis not present

## 2015-11-18 DIAGNOSIS — M6281 Muscle weakness (generalized): Secondary | ICD-10-CM | POA: Diagnosis not present

## 2015-11-18 DIAGNOSIS — M25552 Pain in left hip: Secondary | ICD-10-CM | POA: Diagnosis not present

## 2015-11-18 DIAGNOSIS — R262 Difficulty in walking, not elsewhere classified: Secondary | ICD-10-CM | POA: Diagnosis not present

## 2015-11-18 DIAGNOSIS — M25551 Pain in right hip: Secondary | ICD-10-CM | POA: Diagnosis not present

## 2015-11-21 DIAGNOSIS — M25551 Pain in right hip: Secondary | ICD-10-CM | POA: Diagnosis not present

## 2015-11-21 DIAGNOSIS — R262 Difficulty in walking, not elsewhere classified: Secondary | ICD-10-CM | POA: Diagnosis not present

## 2015-11-21 DIAGNOSIS — M25552 Pain in left hip: Secondary | ICD-10-CM | POA: Diagnosis not present

## 2015-11-21 DIAGNOSIS — M6281 Muscle weakness (generalized): Secondary | ICD-10-CM | POA: Diagnosis not present

## 2015-11-30 ENCOUNTER — Other Ambulatory Visit: Payer: Self-pay | Admitting: Family

## 2015-11-30 NOTE — Telephone Encounter (Signed)
Refill sent per [facility] refill protocol/SLS  

## 2015-12-02 DIAGNOSIS — M25552 Pain in left hip: Secondary | ICD-10-CM | POA: Diagnosis not present

## 2015-12-02 DIAGNOSIS — M25551 Pain in right hip: Secondary | ICD-10-CM | POA: Diagnosis not present

## 2015-12-02 DIAGNOSIS — R262 Difficulty in walking, not elsewhere classified: Secondary | ICD-10-CM | POA: Diagnosis not present

## 2015-12-02 DIAGNOSIS — M6281 Muscle weakness (generalized): Secondary | ICD-10-CM | POA: Diagnosis not present

## 2015-12-05 MED ORDER — LEVOTHYROXINE SODIUM 125 MCG PO TABS: 125.0000 ug | ORAL_TABLET | Freq: Every day | ORAL | 1 refills | Status: DC

## 2015-12-05 NOTE — Telephone Encounter (Signed)
Received fax requesting supervising MD info. Refill re-sent with attached info.

## 2015-12-05 NOTE — Addendum Note (Signed)
Addended by: Kelle Darting A on: 12/05/2015 04:16 PM   Modules accepted: Orders

## 2015-12-07 DIAGNOSIS — R262 Difficulty in walking, not elsewhere classified: Secondary | ICD-10-CM | POA: Diagnosis not present

## 2015-12-07 DIAGNOSIS — M25552 Pain in left hip: Secondary | ICD-10-CM | POA: Diagnosis not present

## 2015-12-07 DIAGNOSIS — M6281 Muscle weakness (generalized): Secondary | ICD-10-CM | POA: Diagnosis not present

## 2015-12-07 DIAGNOSIS — M25551 Pain in right hip: Secondary | ICD-10-CM | POA: Diagnosis not present

## 2015-12-12 DIAGNOSIS — R262 Difficulty in walking, not elsewhere classified: Secondary | ICD-10-CM | POA: Diagnosis not present

## 2015-12-12 DIAGNOSIS — M25552 Pain in left hip: Secondary | ICD-10-CM | POA: Diagnosis not present

## 2015-12-12 DIAGNOSIS — M6281 Muscle weakness (generalized): Secondary | ICD-10-CM | POA: Diagnosis not present

## 2015-12-12 DIAGNOSIS — M25551 Pain in right hip: Secondary | ICD-10-CM | POA: Diagnosis not present

## 2015-12-21 ENCOUNTER — Ambulatory Visit: Payer: BLUE CROSS/BLUE SHIELD | Admitting: Family Medicine

## 2015-12-22 ENCOUNTER — Ambulatory Visit (INDEPENDENT_AMBULATORY_CARE_PROVIDER_SITE_OTHER): Payer: BLUE CROSS/BLUE SHIELD | Admitting: Family Medicine

## 2015-12-22 ENCOUNTER — Encounter: Payer: Self-pay | Admitting: Family Medicine

## 2015-12-22 DIAGNOSIS — M25552 Pain in left hip: Secondary | ICD-10-CM | POA: Diagnosis not present

## 2015-12-26 NOTE — Assessment & Plan Note (Signed)
Radiographs from June normal.  Concerning for labral tear of left hip based on exam.  She has had mild improvement in pain.  She wants to continue with PT and home exercises for now - she will call us if she plateaus and would like to go ahead with MR arthrogram.  Meloxicam if needed.

## 2015-12-26 NOTE — Progress Notes (Signed)
PCP: Nance Pear., NP  Subjective:   HPI: Patient is a 55 y.o. female here for left hip pain.  10/26: Patient reports she's had left hip/groin pain for 1 year. No known injury or trauma. Pain worse after working 12 hour shifts. Difficulty lying on left side. Pain is 6/10, sharp, radiates some into thigh. No numbness or tingling. No skin changes. Taking meloxicam recently with mild benefit.  12/7: Patient reports she continues to have pain anterior groin/hip. She is doing home exercises, physical therapy. Pain level 3/10, sharp. Difficulty bending and moving left leg. Worse with prolonged sitting then going to stand up. Also worse with prolonged standing and walking. No skin changes, numbness.  Past Medical History:  Diagnosis Date  . Arthritis   . Depression   . Diverticulosis 2015   CT SCAN   . Graves disease 1997  . Hearing loss   . History of chicken pox   . History of migraine   . Hyperlipidemia   . Kidney stones     Current Outpatient Prescriptions on File Prior to Visit  Medication Sig Dispense Refill  . levothyroxine (SYNTHROID, LEVOTHROID) 125 MCG tablet Take 1 tablet (125 mcg total) by mouth daily before breakfast. 90 tablet 1  . LORazepam (ATIVAN) 0.5 MG tablet Take 1 tablet (0.5 mg total) by mouth 2 (two) times daily as needed for anxiety. 30 tablet 0  . meloxicam (MOBIC) 7.5 MG tablet Take 1 tablet (7.5 mg total) by mouth daily. 14 tablet 0   No current facility-administered medications on file prior to visit.     Past Surgical History:  Procedure Laterality Date  . APPENDECTOMY  2001  . ELBOW SURGERY Right 2016  . ENDOMETRIAL ABLATION  2003  . HIP FRACTURE SURGERY Right 2009   fell playing with dog  . labrum repair Right 2011  . OOPHORECTOMY Right 2001   due to large ovarian cyst  . THYROIDECTOMY  1997    Allergies  Allergen Reactions  . Tramadol Itching    Social History   Social History  . Marital status: Married    Spouse  name: N/A  . Number of children: N/A  . Years of education: N/A   Occupational History  . Not on file.   Social History Main Topics  . Smoking status: Former Smoker    Years: 15.00    Quit date: 01/16/1999  . Smokeless tobacco: Never Used  . Alcohol use 0.0 oz/week     Comment: occasional  . Drug use: No  . Sexual activity: Yes    Birth control/ protection: None   Other Topics Concern  . Not on file   Social History Narrative   Married (second marriage) has a 39 yr old son in [place]   Worked in Psychologist, educational   Enjoys exercising, hiking, kayaking.    Completed 12th grade   1 dog, 1 cat          Family History  Problem Relation Age of Onset  . Hyperlipidemia Father   . Hypertension Father   . Heart failure Father 73  . Diabetes Sister   . Arthritis Paternal Grandmother   . Kidney disease Paternal Grandmother   . Diabetes Paternal Grandmother   . Arthritis Paternal Grandfather     BP 136/81   Pulse 77   Ht 5\' 3"  (1.6 m)   Wt 134 lb (60.8 kg)   BMI 23.74 kg/m   Review of Systems: See HPI above.    Objective:  Physical Exam:  Gen: NAD, comfortable in exam room  Back/left hip: No gross deformity, scoliosis. No TTP greater trochanter, elsewhere about hip or back.  No midline or bony TTP. FROM with pain on hip external and internal rotation. Strength LEs 5/5 all muscle groups.   Negative SLRs. Sensation intact to light touch bilaterally. Positive logroll left hip.  Negative right.      Assessment & Plan:  1. Left hip pain - Radiographs from June normal.  Concerning for labral tear of left hip based on exam.  She has had mild improvement in pain.  She wants to continue with PT and home exercises for now - she will call us if she plateaus and would like to go ahead with MR arthrogram.  Meloxicam if needed.

## 2016-02-13 ENCOUNTER — Encounter: Payer: Self-pay | Admitting: Family

## 2016-02-14 ENCOUNTER — Ambulatory Visit (INDEPENDENT_AMBULATORY_CARE_PROVIDER_SITE_OTHER): Payer: BLUE CROSS/BLUE SHIELD | Admitting: Family Medicine

## 2016-02-14 ENCOUNTER — Encounter: Payer: Self-pay | Admitting: Family Medicine

## 2016-02-14 ENCOUNTER — Encounter: Payer: Self-pay | Admitting: Family

## 2016-02-14 VITALS — BP 136/88 | HR 70 | Temp 97.6°F | Resp 20 | Wt 135.0 lb

## 2016-02-14 DIAGNOSIS — R6889 Other general symptoms and signs: Secondary | ICD-10-CM

## 2016-02-14 DIAGNOSIS — J40 Bronchitis, not specified as acute or chronic: Secondary | ICD-10-CM

## 2016-02-14 LAB — POC INFLUENZA A&B (BINAX/QUICKVUE)
Influenza A, POC: NEGATIVE
Influenza B, POC: NEGATIVE

## 2016-02-14 MED ORDER — AZITHROMYCIN 250 MG PO TABS: ORAL_TABLET | ORAL | 0 refills | Status: DC

## 2016-02-14 NOTE — Patient Instructions (Signed)
Z-pack, rest, hydrate Mucinex DM Continue flonase.   Acute Bronchitis, Adult Acute bronchitis is when air tubes (bronchi) in the lungs suddenly get swollen. The condition can make it hard to breathe. It can also cause these symptoms:  A cough.  Coughing up clear, yellow, or green mucus.  Wheezing.  Chest congestion.  Shortness of breath.  A fever.  Body aches.  Chills.  A sore throat. Follow these instructions at home: Medicines  Take over-the-counter and prescription medicines only as told by your doctor.  If you were prescribed an antibiotic medicine, take it as told by your doctor. Do not stop taking the antibiotic even if you start to feel better. General instructions  Rest.  Drink enough fluids to keep your pee (urine) clear or pale yellow.  Avoid smoking and secondhand smoke. If you smoke and you need help quitting, ask your doctor. Quitting will help your lungs heal faster.  Use an inhaler, cool mist vaporizer, or humidifier as told by your doctor.  Keep all follow-up visits as told by your doctor. This is important. How is this prevented? To lower your risk of getting this condition again:  Wash your hands often with soap and water. If you cannot use soap and water, use hand sanitizer.  Avoid contact with people who have cold symptoms.  Try not to touch your hands to your mouth, nose, or eyes.  Make sure to get the flu shot every year. Contact a doctor if:  Your symptoms do not get better in 2 weeks. Get help right away if:  You cough up blood.  You have chest pain.  You have very bad shortness of breath.  You become dehydrated.  You faint (pass out) or keep feeling like you are going to pass out.  You keep throwing up (vomiting).  You have a very bad headache.  Your fever or chills gets worse. This information is not intended to replace advice given to you by your health care provider. Make sure you discuss any questions you have with  your health care provider. Document Released: 06/20/2007 Document Revised: 08/10/2015 Document Reviewed: 06/22/2015 Elsevier Interactive Patient Education  2017 [employer].

## 2016-02-14 NOTE — Progress Notes (Signed)
Donna Anderson , 1960-12-03, 56 y.o., female MRN: [id number] Patient Care Team    Relationship Specialty Notifications Start End  Donna Alar, NP PCP - General Internal Medicine  12/01/12     CC: cough  Subjective: Pt presents for an acute OV with complaints of cough of 4 days duration.  Associated symptoms include body aches, fever 101F, burning chest with cough and mild sore throat. Pt has tried Tylenol, theraflu.   Allergies  Allergen Reactions  . Tramadol Itching   Social History  Substance Use Topics  . Smoking status: Former Smoker    Years: 15.00    Quit date: 01/16/1999  . Smokeless tobacco: Never Used  . Alcohol use 0.0 oz/week     Comment: occasional   Past Medical History:  Diagnosis Date  . Arthritis   . Depression   . Diverticulosis 2015   CT SCAN   . Graves disease 1997  . Hearing loss   . History of chicken pox   . History of migraine   . Hyperlipidemia   . Kidney stones    Past Surgical History:  Procedure Laterality Date  . APPENDECTOMY  2001  . ELBOW SURGERY Right 2016  . ENDOMETRIAL ABLATION  2003  . HIP FRACTURE SURGERY Right 2009   fell playing with dog  . labrum repair Right 2011  . OOPHORECTOMY Right 2001   due to large ovarian cyst  . THYROIDECTOMY  1997   Family History  Problem Relation Age of Onset  . Hyperlipidemia Father   . Hypertension Father   . Heart failure Father 8  . Diabetes Sister   . Arthritis Paternal Grandmother   . Kidney disease Paternal Grandmother   . Diabetes Paternal Grandmother   . Arthritis Paternal Grandfather    Allergies as of 02/14/2016      Reactions   Tramadol Itching      Medication List       Accurate as of 02/14/16  4:16 PM. Always use your most recent med list.          levothyroxine 125 MCG tablet Commonly known as:  SYNTHROID, LEVOTHROID Take 1 tablet (125 mcg total) by mouth daily before breakfast.   LORazepam 0.5 MG tablet Commonly known as:  ATIVAN Take 1 tablet (0.5  mg total) by mouth 2 (two) times daily as needed for anxiety.   meloxicam 7.5 MG tablet Commonly known as:  MOBIC Take 1 tablet (7.5 mg total) by mouth daily.       Results for orders placed or performed in visit on 02/14/16 (from the past 24 hour(s))  POC Influenza A&B (Binax test)     Status: Normal   Collection Time: 02/14/16  4:14 PM  Result Value Ref Range   Influenza A, POC Negative Negative   Influenza B, POC Negative Negative   No results found.   ROS: Negative, with the exception of above mentioned in HPI   Objective:  BP 136/88 (BP Location: Left Arm, Patient Position: Sitting, Cuff Size: Normal)   Pulse 70   Temp 97.6 F (36.4 C)   Resp 20   Wt 135 lb (61.2 kg)   SpO2 98%   BMI 23.91 kg/m  Body mass index is 23.91 kg/m. Gen: Afebrile. No acute distress. Nontoxic in appearance, well developed, well nourished.  HENT: AT. [place]. Bilateral TM visualized wnl. MMM, no oral lesions. Bilateral nares erythema and drainage. Throat without erythema or exudates. Cough present. Hoarseness present. No ttp max sinus. Eyes:Pupils  Equal Round Reactive to light, Extraocular movements intact,  Conjunctiva without redness, discharge or icterus. Neck/lymp/endocrine: Supple,no lymphadenopathy CV: RRR  Chest: CTAB, no wheeze or crackles. Good air movement, normal resp effort.  Abd: Soft. NTND. BS present.   Results for orders placed or performed in visit on 02/14/16 (from the past 24 hour(s))  POC Influenza A&B (Binax test)     Status: Normal   Collection Time: 02/14/16  4:14 PM  Result Value Ref Range   Influenza A, POC Negative Negative   Influenza B, POC Negative Negative    Assessment/Plan: Donna Anderson is a 56 y.o. female present for acute OV for  Flu-like symptoms - POC Influenza A&B (Binax test): negative Bronchitis z-pack  mucinex DM Rest, hydrate.  F/U PRN, or if no improvement in 1 week.   electronically signed by:  Donna Pouch, DO  [facility]

## 2016-03-23 ENCOUNTER — Ambulatory Visit: Payer: Self-pay | Admitting: Family

## 2016-06-02 ENCOUNTER — Other Ambulatory Visit: Payer: Self-pay | Admitting: Family

## 2016-06-05 NOTE — Telephone Encounter (Signed)
Levothyroxine refill sent and my chart message sent to pt to schedule f/u with Melissa soon.

## 2016-06-15 ENCOUNTER — Ambulatory Visit (INDEPENDENT_AMBULATORY_CARE_PROVIDER_SITE_OTHER): Payer: BLUE CROSS/BLUE SHIELD | Admitting: Family

## 2016-06-15 ENCOUNTER — Encounter: Payer: Self-pay | Admitting: Family

## 2016-06-15 ENCOUNTER — Other Ambulatory Visit (HOSPITAL_COMMUNITY)
Admission: RE | Admit: 2016-06-15 | Discharge: 2016-06-15 | Disposition: A | Discharge status: Home / Self Care | Payer: BLUE CROSS/BLUE SHIELD | Source: Ambulatory Visit | Attending: Family | Admitting: Family

## 2016-06-15 VITALS — BP 135/80 | HR 71 | Temp 98.2°F | Resp 16 | Ht 63.0 in | Wt 140.4 lb

## 2016-06-15 DIAGNOSIS — Z01419 Encounter for gynecological examination (general) (routine) without abnormal findings: Secondary | ICD-10-CM | POA: Insufficient documentation

## 2016-06-15 DIAGNOSIS — R7989 Other specified abnormal findings of blood chemistry: Secondary | ICD-10-CM

## 2016-06-15 DIAGNOSIS — Z0000 Encounter for general adult medical examination without abnormal findings: Secondary | ICD-10-CM

## 2016-06-15 DIAGNOSIS — E2839 Other primary ovarian failure: Secondary | ICD-10-CM

## 2016-06-15 DIAGNOSIS — G4700 Insomnia, unspecified: Secondary | ICD-10-CM

## 2016-06-15 DIAGNOSIS — H9190 Unspecified hearing loss, unspecified ear: Secondary | ICD-10-CM | POA: Diagnosis not present

## 2016-06-15 LAB — URINALYSIS, ROUTINE W REFLEX MICROSCOPIC
BILIRUBIN URINE: NEGATIVE
HGB URINE DIPSTICK: NEGATIVE
KETONES UR: NEGATIVE
LEUKOCYTES UA: NEGATIVE
NITRITE: NEGATIVE
PH: 6 (ref 5.0–8.0)
RBC / HPF: NONE SEEN (ref 0–?)
Specific Gravity, Urine: 1.02 (ref 1.000–1.030)
Total Protein, Urine: NEGATIVE
Urine Glucose: 250 — AB
Urobilinogen, UA: 0.2 (ref 0.0–1.0)
WBC, UA: NONE SEEN (ref 0–?)

## 2016-06-15 LAB — LIPID PANEL
Cholesterol: 223 mg/dL — ABNORMAL HIGH (ref 0–200)
HDL: 35.3 mg/dL — ABNORMAL LOW (ref >39.00)
Total CHOL/HDL Ratio: 6
Triglycerides: 734 mg/dL — ABNORMAL HIGH (ref 0.0–149.0)

## 2016-06-15 LAB — HEPATIC FUNCTION PANEL
ALBUMIN: 4.6 g/dL (ref 3.5–5.2)
ALK PHOS: 121 U/L — AB (ref 39–117)
ALT: 23 U/L (ref 0–35)
AST: 18 U/L (ref 0–37)
BILIRUBIN DIRECT: 0 mg/dL (ref 0.0–0.3)
Total Bilirubin: 0.3 mg/dL (ref 0.2–1.2)
Total Protein: 7.3 g/dL (ref 6.0–8.3)

## 2016-06-15 LAB — CBC WITH DIFFERENTIAL/PLATELET
BASOS ABS: 0.1 10*3/uL (ref 0.0–0.1)
Basophils Relative: 0.8 % (ref 0.0–3.0)
EOS ABS: 0.1 10*3/uL (ref 0.0–0.7)
Eosinophils Relative: 1.8 % (ref 0.0–5.0)
HEMATOCRIT: 42.5 % (ref 36.0–46.0)
HEMOGLOBIN: 14.6 g/dL (ref 12.0–15.0)
LYMPHS PCT: 31 % (ref 12.0–46.0)
Lymphs Abs: 2 10*3/uL (ref 0.7–4.0)
MCHC: 34.4 g/dL (ref 30.0–36.0)
MCV: 90.4 fl (ref 78.0–100.0)
MONOS PCT: 5.8 % (ref 3.0–12.0)
Monocytes Absolute: 0.4 10*3/uL (ref 0.1–1.0)
Neutro Abs: 3.9 10*3/uL (ref 1.4–7.7)
Neutrophils Relative %: 60.6 % (ref 43.0–77.0)
PLATELETS: 267 10*3/uL (ref 150.0–400.0)
RBC: 4.7 Mil/uL (ref 3.87–5.11)
RDW: 12.6 % (ref 11.5–15.5)
WBC: 6.5 10*3/uL (ref 4.0–10.5)

## 2016-06-15 LAB — BASIC METABOLIC PANEL
BUN: 14 mg/dL (ref 6–23)
CALCIUM: 9.9 mg/dL (ref 8.4–10.5)
CHLORIDE: 102 meq/L (ref 96–112)
CO2: 31 mEq/L (ref 19–32)
Creatinine, Ser: 0.82 mg/dL (ref 0.40–1.20)
GFR: 76.99 mL/min (ref >60.00)
Glucose, Bld: 217 mg/dL — ABNORMAL HIGH (ref 70–99)
Potassium: 4.7 mEq/L (ref 3.5–5.1)
SODIUM: 140 meq/L (ref 135–145)

## 2016-06-15 LAB — TSH: TSH: 1.85 u[IU]/mL (ref 0.35–4.50)

## 2016-06-15 LAB — LDL CHOLESTEROL, DIRECT: LDL DIRECT: 109 mg/dL

## 2016-06-15 MED ORDER — TRAZODONE HCL 50 MG PO TABS: 25.0000 mg | ORAL_TABLET | Freq: Every evening | ORAL | 3 refills | Status: DC | PRN

## 2016-06-15 MED FILL — traZODone HCL 50 MG TABS: 50 | 30 days supply | Qty: 30 | Fill #0 | Status: TO

## 2016-06-15 NOTE — Patient Instructions (Addendum)
Please contact your insurance to check that shingrix is covered then schedule a nurse visit.  Complete lab work prior to leaving. Begin trazodone at bedtime was needed for sleep.

## 2016-06-15 NOTE — Progress Notes (Signed)
Subjective:    Patient ID: Donna Anderson, female    DOB: 05-15-1960, 56 y.o.   MRN: [id number]  HPI  Donna Anderson is a 56 yr old female who presents today for cpx.  Patient presents today for complete physical.  Immunizations:tdap up to date, due for shingrix. Diet: feels like diet needs to be improved   Wt Readings from Last 3 Encounters:  06/15/16 140 lb 6.4 oz (63.7 kg)  02/14/16 135 lb (61.2 kg)  12/22/15 134 lb (60.8 kg)   Exercise: Colonoscopy: was done at 2013-  Path showed adenomatous polyp not exercising as much as she would like.  Sitting at her new job Dexa: due Pap Smear: due Mammogram: 9/16  Insomnia- has tried tylenol pm, melatonin without improvement  Hypothyroid- continues synthroid reports feeling well on current dose of synthroid.   Lab Results  Component Value Date   TSH 0.50 10/31/2015   Anxiety/Depression-using lorazepam prn, has not needed in 5-6 months.  GYN exam-  Last pap 01/16/12.    Review of Systems  Constitutional: Positive for unexpected weight change.  HENT: Positive for hearing loss. Negative for rhinorrhea.        Has hearing aids (since 2013)   Eyes: Negative for visual disturbance.  Respiratory: Negative for cough.   Cardiovascular: Negative for leg swelling.  Gastrointestinal: Negative for blood in stool, constipation and diarrhea.  Genitourinary: Negative for dysuria and frequency.  Musculoskeletal: Negative for arthralgias and myalgias.  Skin: Negative for rash.  Neurological: Negative for headaches.  Hematological: Negative for adenopathy.  Psychiatric/Behavioral:       Reports mood is stable.        Past Medical History:  Diagnosis Date  . Arthritis   . Depression   . Diverticulosis 2015   CT SCAN   . Graves disease 1997  . Hearing loss   . History of chicken pox   . History of migraine   . Hyperlipidemia   . Kidney stones      Social History   Social History  . Marital status: Married    Spouse name: N/A    . Number of children: N/A  . Years of education: N/A   Occupational History  . Not on file.   Social History Main Topics  . Smoking status: Former Smoker    Years: 15.00    Quit date: 01/16/1999  . Smokeless tobacco: Never Used  . Alcohol use 0.0 oz/week     Comment: occasional  . Drug use: No  . Sexual activity: Yes    Birth control/ protection: None   Other Topics Concern  . Not on file   Social History Narrative   Married (second marriage) has a 31 yr old son in [place]   Worked in [employer], educational   Enjoys exercising, hiking, kayaking.    Completed 12th grade   1 dog, 1 cat          Past Surgical History:  Procedure Laterality Date  . APPENDECTOMY  2001  . ELBOW SURGERY Right 2016  . ENDOMETRIAL ABLATION  2003  . HIP FRACTURE SURGERY Right 2009   fell playing with dog  . labrum repair Right 2011  . OOPHORECTOMY Right 2001   due to large ovarian cyst  . THYROIDECTOMY  1997    Family History  Problem Relation Age of Onset  . Hyperlipidemia Father   . Hypertension Father   . Heart failure Father 16  . Diabetes Sister   . Arthritis Paternal Grandmother   .  Kidney disease Paternal Grandmother   . Diabetes Paternal Grandmother   . Arthritis Paternal Grandfather     Allergies  Allergen Reactions  . Tramadol Itching    Current Outpatient Prescriptions on File Prior to Visit  Medication Sig Dispense Refill  . levothyroxine (SYNTHROID, LEVOTHROID) 125 MCG tablet TAKE 1 TABLET DAILY BEFORE BREAKFAST 90 tablet 0  . LORazepam (ATIVAN) 0.5 MG tablet Take 1 tablet (0.5 mg total) by mouth 2 (two) times daily as needed for anxiety. 30 tablet 0   No current facility-administered medications on file prior to visit.     BP 135/80 (BP Location: Right Arm, Cuff Size: Normal)   Pulse 71   Temp 98.2 F (36.8 C) (Oral)   Resp 16   Ht 5\' 3"  (1.6 m)   Wt 140 lb 6.4 oz (63.7 kg)   SpO2 99%   BMI 24.87 kg/m    Objective:   Physical Exam  Physical Exam   Constitutional: She is oriented to person, place, and time. She appears well-developed and well-nourished. No distress.  HENT:  Head: Normocephalic and atraumatic.  Right Ear: Tympanic membrane and ear canal normal.  Left Ear: Tympanic membrane and ear canal normal.  Mouth/Throat: Oropharynx is clear and moist.  Eyes: Pupils are equal, round, and reactive to light. No scleral icterus.  Neck: Normal range of motion. No thyromegaly present.  Cardiovascular: Normal rate and regular rhythm.   No murmur heard. Pulmonary/Chest: Effort normal and breath sounds normal. No respiratory distress. He has no wheezes. She has no rales. She exhibits no tenderness.  Abdominal: Soft. Bowel sounds are normal. She exhibits no distension and no mass. There is no tenderness. There is no rebound and no guarding.  Musculoskeletal: She exhibits no edema.  Lymphadenopathy:    She has no cervical adenopathy.  Neurological: She is alert and oriented to person, place, and time. She has normal patellar reflexes. She exhibits normal muscle tone. Coordination normal.  Skin: Skin is warm and dry.  Psychiatric: She has a normal mood and affect. Her behavior is normal. Judgment and thought content normal.  Breast: deferred  Inguinal/mons: Normal without inguinal adenopathy  External genitalia: Normal  BUS/Urethra/Skene's glands: Normal  Bladder: Normal  Vagina: Normal  Cervix: Normal  Uterus: normal in size, shape and contour. Midline and mobile  Adnexa/parametria:  Rt: Without masses or tenderness.  Lt: Without masses or tenderness.  Anus and perineum: Normal            Assessment & Plan:        Assessment & Plan:  Preventative Care- Discussed healthy diet, exercise. Pap performed today. Refer for mammogram, bone density, routine lab testing. I have advised her to check insurance coverage for shingrix.   Insomnia- uncontrolled. Trial of trazodone.   Hearing loss- refer for audiology follow up.

## 2016-06-17 ENCOUNTER — Telehealth: Payer: Self-pay | Admitting: Family

## 2016-06-17 DIAGNOSIS — D369 Benign neoplasm, unspecified site: Secondary | ICD-10-CM

## 2016-06-17 NOTE — Telephone Encounter (Signed)
Please let pt know that I reviewed the results of her colonoscopy and I think she is due for follow up colo.  I will place referral.

## 2016-06-19 LAB — CYTOLOGY - PAP
Diagnosis: NEGATIVE
HPV: NOT DETECTED

## 2016-06-20 NOTE — Telephone Encounter (Signed)
Notified pt and she voices understanding. Referral signed. 

## 2016-06-21 ENCOUNTER — Encounter: Payer: Self-pay | Admitting: Family

## 2016-06-21 ENCOUNTER — Telehealth: Payer: Self-pay | Admitting: Family

## 2016-06-21 ENCOUNTER — Other Ambulatory Visit (INDEPENDENT_AMBULATORY_CARE_PROVIDER_SITE_OTHER): Payer: BLUE CROSS/BLUE SHIELD

## 2016-06-21 ENCOUNTER — Telehealth: Payer: Self-pay | Admitting: Internal Medicine

## 2016-06-21 DIAGNOSIS — R739 Hyperglycemia, unspecified: Secondary | ICD-10-CM

## 2016-06-21 DIAGNOSIS — E119 Type 2 diabetes mellitus without complications: Secondary | ICD-10-CM

## 2016-06-21 DIAGNOSIS — E1165 Type 2 diabetes mellitus with hyperglycemia: Secondary | ICD-10-CM | POA: Insufficient documentation

## 2016-06-21 DIAGNOSIS — E781 Pure hyperglyceridemia: Secondary | ICD-10-CM

## 2016-06-21 HISTORY — DX: Pure hyperglyceridemia: E78.1

## 2016-06-21 HISTORY — DX: Type 2 diabetes mellitus without complications: E11.9

## 2016-06-21 LAB — HEMOGLOBIN A1C: Hgb A1c MFr Bld: 8.1 % — ABNORMAL HIGH (ref 4.6–6.5)

## 2016-06-21 MED ORDER — ATORVASTATIN CALCIUM 20 MG PO TABS: 20.0000 mg | ORAL_TABLET | Freq: Every day | ORAL | 3 refills | Status: DC

## 2016-06-21 MED FILL — ATORVASTATIN 20 MG TABLET: 20 | 30 days supply | Qty: 30 | Fill #0

## 2016-06-21 NOTE — Telephone Encounter (Signed)
Also, trigs are elevated.  I would like her to start atorvastatin.  Pap smear is normal. Needs follow up in 3 months please.

## 2016-06-21 NOTE — Telephone Encounter (Signed)
Notified pt of results and medication sent to pharmacy. Pt voiced understanding.  A1c added.

## 2016-06-21 NOTE — Telephone Encounter (Signed)
Add on for Free t3 & Free t4 has been faxed to [facility]. KMP

## 2016-06-21 NOTE — Telephone Encounter (Signed)
Please contact patient and let her know that lab work is showing diabetes.  Can you please ask lab to add on A1C, dx hyperglycemia.  Also, I would like her to work on diet/exercise- work on avoiding concentrated sweets, and limiting white carbs (rice/bread/pasta/potatoes). Instead substitute whole grain versions with reasonable portions.

## 2016-06-23 MED ORDER — METFORMIN HCL 500 MG PO TABS: 500.0000 mg | ORAL_TABLET | Freq: Two times a day (BID) | ORAL | 3 refills | Status: DC

## 2016-06-23 NOTE — Telephone Encounter (Signed)
A1C confirms diabetes.  I would like her to begin metformin BID and lets bring her in for nurse visit for glucometer testing.

## 2016-06-27 ENCOUNTER — Encounter: Payer: Self-pay | Admitting: Family

## 2016-06-28 ENCOUNTER — Encounter: Payer: Self-pay | Admitting: Family

## 2016-06-28 NOTE — Telephone Encounter (Signed)
Please call patient to schedule a nurse visit for glucometer teaching.

## 2016-06-29 MED FILL — metFORMIN HCL 500 MG TABS: 500 | 30 days supply | Qty: 60 | Fill #0

## 2016-06-30 ENCOUNTER — Encounter: Payer: Self-pay | Admitting: Family

## 2016-07-02 ENCOUNTER — Encounter: Payer: Self-pay | Admitting: Family

## 2016-07-03 ENCOUNTER — Encounter (HOSPITAL_COMMUNITY): Payer: Self-pay

## 2016-07-03 ENCOUNTER — Encounter: Payer: Self-pay | Admitting: Family

## 2016-07-03 ENCOUNTER — Emergency Department (HOSPITAL_COMMUNITY): Payer: BLUE CROSS/BLUE SHIELD

## 2016-07-03 ENCOUNTER — Emergency Department (HOSPITAL_COMMUNITY)
Admission: EM | Admit: 2016-07-03 | Discharge: 2016-07-03 | Disposition: A | Discharge status: Home / Self Care | Payer: BLUE CROSS/BLUE SHIELD | Attending: Emergency Medicine | Admitting: Emergency Medicine

## 2016-07-03 DIAGNOSIS — R0602 Shortness of breath: Secondary | ICD-10-CM | POA: Diagnosis not present

## 2016-07-03 DIAGNOSIS — IMO0001 Reserved for inherently not codable concepts without codable children: Secondary | ICD-10-CM

## 2016-07-03 DIAGNOSIS — Z7984 Long term (current) use of oral hypoglycemic drugs: Secondary | ICD-10-CM | POA: Insufficient documentation

## 2016-07-03 DIAGNOSIS — Z87891 Personal history of nicotine dependence: Secondary | ICD-10-CM | POA: Diagnosis not present

## 2016-07-03 DIAGNOSIS — R110 Nausea: Secondary | ICD-10-CM | POA: Insufficient documentation

## 2016-07-03 DIAGNOSIS — E1165 Type 2 diabetes mellitus with hyperglycemia: Principal | ICD-10-CM

## 2016-07-03 DIAGNOSIS — E039 Hypothyroidism, unspecified: Secondary | ICD-10-CM | POA: Diagnosis not present

## 2016-07-03 DIAGNOSIS — R404 Transient alteration of awareness: Secondary | ICD-10-CM | POA: Diagnosis not present

## 2016-07-03 DIAGNOSIS — R42 Dizziness and giddiness: Secondary | ICD-10-CM | POA: Diagnosis not present

## 2016-07-03 DIAGNOSIS — E119 Type 2 diabetes mellitus without complications: Secondary | ICD-10-CM | POA: Diagnosis not present

## 2016-07-03 LAB — COMPREHENSIVE METABOLIC PANEL
ALBUMIN: 4.2 g/dL (ref 3.5–5.0)
ALK PHOS: 109 U/L (ref 38–126)
ALT: 20 U/L (ref 14–54)
ANION GAP: 9 (ref 5–15)
AST: 20 U/L (ref 15–41)
BUN: 11 mg/dL (ref 6–20)
CALCIUM: 9.1 mg/dL (ref 8.9–10.3)
CHLORIDE: 103 mmol/L (ref 101–111)
CO2: 23 mmol/L (ref 22–32)
CREATININE: 0.77 mg/dL (ref 0.44–1.00)
GFR calc non Af Amer: >60 mL/min (ref >60)
GLUCOSE: 160 mg/dL — AB (ref 65–99)
Potassium: 3.5 mmol/L (ref 3.5–5.1)
Sodium: 135 mmol/L (ref 135–145)
Total Bilirubin: 0.4 mg/dL (ref 0.3–1.2)
Total Protein: 6.8 g/dL (ref 6.5–8.1)

## 2016-07-03 LAB — URINALYSIS, ROUTINE W REFLEX MICROSCOPIC
BILIRUBIN URINE: NEGATIVE
Glucose, UA: NEGATIVE mg/dL
Hgb urine dipstick: NEGATIVE
Ketones, ur: NEGATIVE mg/dL
LEUKOCYTES UA: NEGATIVE
NITRITE: NEGATIVE
PROTEIN: NEGATIVE mg/dL
Specific Gravity, Urine: 1.003 — ABNORMAL LOW (ref 1.005–1.030)
pH: 7 (ref 5.0–8.0)

## 2016-07-03 LAB — CBC
HCT: 40.3 % (ref 36.0–46.0)
HEMOGLOBIN: 13.5 g/dL (ref 12.0–15.0)
MCH: 30.7 pg (ref 26.0–34.0)
MCHC: 33.5 g/dL (ref 30.0–36.0)
MCV: 91.6 fL (ref 78.0–100.0)
PLATELETS: 223 10*3/uL (ref 150–400)
RBC: 4.4 MIL/uL (ref 3.87–5.11)
RDW: 12.7 % (ref 11.5–15.5)
WBC: 7.5 10*3/uL (ref 4.0–10.5)

## 2016-07-03 LAB — I-STAT TROPONIN, ED: TROPONIN I, POC: 0.01 ng/mL (ref 0.00–0.08)

## 2016-07-03 LAB — D-DIMER, QUANTITATIVE: D-Dimer, Quant: <0.27 ug/mL-FEU (ref 0.00–0.50)

## 2016-07-03 MED ORDER — MECLIZINE HCL 25 MG PO TABS
25.0000 mg | ORAL_TABLET | Freq: Once | ORAL | Status: AC
  Administered 2016-07-03: 25 mg via ORAL
  Filled 2016-07-03: qty 1

## 2016-07-03 MED ORDER — MECLIZINE HCL 25 MG PO TABS: 25.0000 mg | ORAL_TABLET | Freq: Three times a day (TID) | ORAL | 0 refills | Status: DC | PRN

## 2016-07-03 MED ORDER — METOCLOPRAMIDE HCL 5 MG/ML IJ SOLN
10.0000 mg | Freq: Once | INTRAMUSCULAR | Status: AC
  Administered 2016-07-03: 10 mg via INTRAVENOUS
  Filled 2016-07-03: qty 2

## 2016-07-03 MED ORDER — SODIUM CHLORIDE 0.9 % IV BOLUS (SEPSIS)
1000.0000 mL | Freq: Once | INTRAVENOUS | Status: AC
  Administered 2016-07-03: 1000 mL via INTRAVENOUS

## 2016-07-03 MED ORDER — SODIUM CHLORIDE 0.9 % IV SOLN
1000.0000 mL | INTRAVENOUS | Status: DC
  Administered 2016-07-03: 1000 mL via INTRAVENOUS

## 2016-07-03 NOTE — ED Notes (Signed)
Patient transported to MRI 

## 2016-07-03 NOTE — ED Notes (Signed)
Patient transported to X-ray 

## 2016-07-03 NOTE — ED Provider Notes (Signed)
[facility] DEPT Provider Note   CSN: [id number] Arrival date & time: 07/03/16  1300     History   Chief Complaint Chief Complaint  Patient presents with  . Dizziness    HPI Donna Anderson is a 56 y.o. female presenting with a 5 day history of dizziness, shortness of breath, nausea, and abdominal cramping.  Patient states that last week she had an annual physical with her PCP. Her hemoglobin A1c was 8, and her PCP put her on metformin 1000 mg daily. She started taking this medicine on Friday, and Friday evening she started experiencing symptoms. She called her doctor's office, and they told her to half the dose to 500 daily, but there has been no relief in her symptoms. Since Friday, she reports symptoms have remained consistent or worsened. Her dizziness is described as and imbalance- feeling like she is going to fall over. This is worse when she standing up or sitting upright. When she is leaning back or supine, she does not have symptoms. The imbalance is worse when she moves her head and when she bends forward. She denies feeling like the room is spinning. Patient shortness of breath is described as an inability to catch her breath. This morning, she reports her shortness of breath is the worst it's been. She denies any cough, fever, chills, sore throat, or chest pain. While the patient has been nauseated since Friday, she reports no vomiting. She has had a normal appetite, and food does not make her feel better or worse. Nausea was unrelieved by zofran given by EMS. Abdominal cramping is constant, but it is worse after she had some bowel movement. She reports that she feels like she is constipated and she is trying to have a bowel movement, but her stool is still soft. She denies blood in her stool.  Additionally, patient reports polyuria and polydipsia since Friday. She denies other urinary symptoms including dysuria, hematuria, or discharge/odor. Patient reports intermittent lower leg  cramping 2 weeks. PCP thought this was due to her statin, and patient was taken off her statin 1 week ago. However her leg cramping has continued, the patient reports bad cramps last night.  Per EMS, patient's blood pressure was elevated upon arrival on scene at about 160/100. Throughout transport, her blood pressure decreased. CBG en route was 131. Patient TSH was last checked last week, and it was normal. She is taking the same dose of Synthroid at this time.  HPI  Past Medical History:  Diagnosis Date  . Arthritis   . Depression   . Diabetes type 2, controlled ([place]) 06/21/2016  . Diverticulosis 2015   CT SCAN   . Graves disease 1997  . Hearing loss   . History of chicken pox   . History of migraine   . Hyperlipidemia   . Hypertriglyceridemia 06/21/2016  . Kidney stones     Patient Active Problem List   Diagnosis Date Noted  . Diabetes type 2, controlled ([place]) 06/21/2016  . Hypertriglyceridemia 06/21/2016  . Left hip pain 11/14/2015  . Fatty liver 07/30/2015  . Influenza-like illness 03/22/2015  . Restless leg syndrome 09/22/2014  . Neck pain 03/09/2014  . Lateral epicondylitis 10/22/2013  . Cubital tunnel syndrome on right 09/30/2013  . Carpal tunnel syndrome 07/30/2013  . Acute mesenteric adenitis 03/31/2013  . Adnexal cyst 03/31/2013  . Allergic rhinitis 02/09/2013  . Swelling of finger joint of right hand 02/09/2013  . Hyperglycemia 12/04/2012  . Nephrolithiasis 12/01/2012  . Anxiety and depression  12/01/2012  . Hypothyroidism 12/01/2012    Past Surgical History:  Procedure Laterality Date  . APPENDECTOMY  2001  . ELBOW SURGERY Right 2016  . ENDOMETRIAL ABLATION  2003  . HIP FRACTURE SURGERY Right 2009   fell playing with dog  . labrum repair Right 2011  . OOPHORECTOMY Right 2001   due to large ovarian cyst  . THYROIDECTOMY  1997    OB History    No data available       Home Medications    Prior to Admission medications   Medication Sig Start Date  End Date Taking? Authorizing Provider  levothyroxine (SYNTHROID, LEVOTHROID) 125 MCG tablet TAKE 1 TABLET DAILY BEFORE BREAKFAST 06/05/16  Yes Debbrah Alar, NP  LORazepam (ATIVAN) 0.5 MG tablet Take 1 tablet (0.5 mg total) by mouth 2 (two) times daily as needed for anxiety. 11/09/15  Yes Debbrah Alar, NP  metFORMIN (GLUCOPHAGE) 500 MG tablet Take 1 tablet (500 mg total) by mouth 2 (two) times daily with a meal. Patient taking differently: Take 250 mg by mouth 2 (two) times daily with a meal.  06/23/16  Yes Debbrah Alar, NP  traZODone (DESYREL) 50 MG tablet Take 0.5-1 tablets (25-50 mg total) by mouth at bedtime as needed for sleep. 06/15/16  Yes Debbrah Alar, NP  atorvastatin (LIPITOR) 20 MG tablet Take 1 tablet (20 mg total) by mouth daily. Patient not taking: Reported on 07/03/2016 06/21/16   Debbrah Alar, NP  meclizine (ANTIVERT) 25 MG tablet Take 1 tablet (25 mg total) by mouth 3 (three) times daily as needed for dizziness. 07/03/16   Emerick Weatherly, PA-C    Family History Family History  Problem Relation Age of Onset  . Hyperlipidemia Father   . Hypertension Father   . Heart failure Father 37  . Diabetes Sister   . Arthritis Paternal Grandmother   . Kidney disease Paternal Grandmother   . Diabetes Paternal Grandmother   . Arthritis Paternal Grandfather     Social History Social History  Substance Use Topics  . Smoking status: Former Smoker    Years: 15.00    Quit date: 01/16/1999  . Smokeless tobacco: Never Used  . Alcohol use 0.0 oz/week     Comment: occasional     Allergies   Tramadol   Review of Systems Review of Systems  Constitutional: Negative for appetite change, chills and fever.  HENT: Negative for congestion, ear pain, postnasal drip, rhinorrhea, sinus pain, sinus pressure and sore throat.   Eyes: Negative for photophobia and visual disturbance.  Respiratory: Positive for shortness of breath. Negative for cough, chest tightness  and wheezing.   Cardiovascular: Negative for chest pain, palpitations and leg swelling.  Gastrointestinal: Positive for abdominal pain and nausea. Negative for abdominal distention, blood in stool, constipation, diarrhea and vomiting.  Endocrine: Positive for polydipsia and polyuria.  Genitourinary: Positive for frequency. Negative for dysuria, flank pain and hematuria.  Musculoskeletal: Negative for back pain, neck pain and neck stiffness.  Skin: Negative for rash.  Neurological: Positive for dizziness. Negative for speech difficulty, weakness, light-headedness and headaches.  Psychiatric/Behavioral: Negative for agitation and confusion.     Physical Exam Updated Vital Signs BP 110/75   Pulse (!) 57   Temp 97.6 F (36.4 C)   Resp 18   Ht 5\' 3"  (1.6 m)   Wt 63.5 kg (140 lb)   SpO2 100%   BMI 24.80 kg/m   Physical Exam  Constitutional: She is oriented to person, place, and time. She appears well-developed  and well-nourished. No distress.  HENT:  Head: Normocephalic and atraumatic.  Eyes: Conjunctivae, EOM and lids are normal. Pupils are equal, round, and reactive to light.  Neck: Normal range of motion. Neck supple.  Cardiovascular: Normal rate, regular rhythm, normal heart sounds and intact distal pulses.   Pulmonary/Chest: Effort normal and breath sounds normal. No respiratory distress. She has no wheezes.  Abdominal: Soft. Bowel sounds are normal. She exhibits no distension. There is tenderness in the suprapubic area. There is no rebound, no guarding, no tenderness at McBurney's point and negative Murphy's sign.  Musculoskeletal: Normal range of motion.  MAE. Strength equal bilaterally. Sensation intact bilaterally. No tenderness to palpation of either calf. Pulses equal and intact bilaterally in all extremities.  Neurological: She is alert and oriented to person, place, and time. She has normal strength. No cranial nerve deficit or sensory deficit. GCS eye subscore is 4. GCS  verbal subscore is 5. GCS motor subscore is 6.  Skin: Skin is warm and dry. No rash noted. She is not diaphoretic.  Psychiatric: She has a normal mood and affect.  Nursing note and vitals reviewed.    ED Treatments / Results  Labs (all labs ordered are listed, but only abnormal results are displayed) Labs Reviewed  URINALYSIS, ROUTINE W REFLEX MICROSCOPIC - Abnormal; Notable for the following:       Result Value   Color, Urine STRAW (*)    Specific Gravity, Urine 1.003 (*)    All other components within normal limits  COMPREHENSIVE METABOLIC PANEL - Abnormal; Notable for the following:    Glucose, Bld 160 (*)    All other components within normal limits  CBC  D-DIMER, QUANTITATIVE (NOT AT [facility])  I-STAT TROPOININ, ED    EKG  EKG Interpretation  Date/Time:  Tuesday July 03 2016 13:52:20 EDT Ventricular Rate:  69 PR Interval:  172 QRS Duration: 78 QT Interval:  410 QTC Calculation: 439 R Axis:   44 Text Interpretation:  Normal sinus rhythm Normal ECG Confirmed by Hazle Coca [phone]) on 07/03/2016 2:12:37 PM       Radiology Dg Chest 2 View  Result Date: 07/03/2016 CLINICAL DATA:  56 y/o  F; shortness of breath. EXAM: CHEST  2 VIEW COMPARISON:  None. FINDINGS: The heart size and mediastinal contours are within normal limits. Both lungs are clear. The visualized skeletal structures are unremarkable. IMPRESSION: No active cardiopulmonary disease. Electronically Signed   By: Kristine Garbe M.D.   On: 07/03/2016 14:21   Mr Brain Wo Contrast (neuro Protocol)  Result Date: 07/03/2016 CLINICAL DATA:  Dizziness which began a few days ago. Recently diagnosed diabetes mellitus. EXAM: MRI HEAD WITHOUT CONTRAST TECHNIQUE: Multiplanar, multiecho pulse sequences of the brain and surrounding structures were obtained without intravenous contrast. COMPARISON:  06/23/2014 CT head was normal. FINDINGS: Brain: No evidence for acute infarction, hemorrhage, mass lesion, hydrocephalus, or  extra-axial fluid. Normal for age cerebral volume. No significant white matter disease. Vascular: Flow voids are maintained throughout the carotid, basilar, and vertebral arteries. There are no areas of chronic hemorrhage. Skull and upper cervical spine: Normal marrow signal. Sinuses/Orbits: Negative. Other: None. IMPRESSION: Unremarkable cranial MRI. No cause is seen for the reported symptoms. No evidence for acute stroke or significant white matter disease. Electronically Signed   By: Staci Righter M.D.   On: 07/03/2016 16:56    Procedures Procedures (including critical care time)  Medications Ordered in ED Medications  sodium chloride 0.9 % bolus 1,000 mL (0 mLs Intravenous Stopped 07/03/16  1424)    Followed by  0.9 %  sodium chloride infusion (0 mLs Intravenous Stopped 07/03/16 1930)  meclizine (ANTIVERT) tablet 25 mg (25 mg Oral Given 07/03/16 1354)  metoCLOPramide (REGLAN) injection 10 mg (10 mg Intravenous Given 07/03/16 1354)     Initial Impression / Assessment and Plan / ED Course  I have reviewed the triage vital signs and the nursing notes.  Pertinent labs & imaging results that were available during my care of the patient were reviewed by me and considered in my medical decision making (see chart for details).  Clinical Course as of Jul 04 2015  Tue Jul 03, 2016  1600 On initial assessment, patient is lying comfortably in bed in no apparent distress. Patient's complaints of dizziness/imbalance, nausea, and SOB may be due to a variety of causes. Will order basic labs, EKG, UA, and CXR for initial assessment. I will start IVF, reglan, and meclizine for sx relief.   [[place]]  5035 On reassessment, pt states her nausea is improved, but she is still feeling off balance. Pt seen ambulating from room to bathroom without assistance, and without difficulty.   [[place]]  1700 Labs, UA, EKG, and CXR reassuring. Spoke with attending, and Dr. Ralene Bathe evaluated pt. Will order troponin and d-dimer to r/o ACS  or PE, and MRI to r/o stroke.   [[place]]  1900 Troponin, d dimer and MRI negative. Pt reports she is feeling the same, but she is ready to leave. She states she has a good relation with her PCP, and she can f/u easily. Discussed findings with pt, and importance of f/u with PCP as soon as possible. Pt states she understands. Pt told she can try meclizine for sx relief. Return precautions discussed. Pt agrees to plan.   [[place]]    Clinical Course User Index [[place]] Raeghan Demeter, PA-C     Final Clinical Impressions(s) / ED Diagnoses   Final diagnoses:  Dizziness  Nausea    New Prescriptions Discharge Medication List as of 07/03/2016  7:17 PM    START taking these medications   Details  meclizine (ANTIVERT) 25 MG tablet Take 1 tablet (25 mg total) by mouth 3 (three) times daily as needed for dizziness., Starting Tue 07/03/2016, Print         [place], Collins Dimaria, PA-C 07/03/16 2018    Quintella Reichert, MD 07/04/16 1100

## 2016-07-03 NOTE — ED Triage Notes (Signed)
Pt. Coming from work via [employer] for dizziness that started this weekend. Pt. Recently diagnosed DM and started metformin Friday. Pt. Had dizziness and abdomen cramping since then. Pt. Only taking 1/2 dose since Monday. Pt. Also c/o pain in right eye. EDP at bedside. Dizziness when sitting up or standing.

## 2016-07-03 NOTE — Discharge Instructions (Signed)
Follow up with your primary care as soon as possible for further evaluation of your symptoms.  You make use meclizine as needed for dizziness.  Return to the ED if you experience fevers, chill, persistent vomiting, shortness of breath, chest pain, or any new or worsening symptoms.

## 2016-07-03 NOTE — ED Notes (Signed)
Pt remains in MRI 

## 2016-07-06 ENCOUNTER — Ambulatory Visit (HOSPITAL_BASED_OUTPATIENT_CLINIC_OR_DEPARTMENT_OTHER)
Admission: RE | Admit: 2016-07-06 | Discharge: 2016-07-06 | Disposition: A | Discharge status: Home / Self Care | Payer: BLUE CROSS/BLUE SHIELD | Source: Ambulatory Visit | Attending: Family | Admitting: Family

## 2016-07-06 ENCOUNTER — Ambulatory Visit (INDEPENDENT_AMBULATORY_CARE_PROVIDER_SITE_OTHER): Payer: BLUE CROSS/BLUE SHIELD | Admitting: Behavioral Health

## 2016-07-06 DIAGNOSIS — M8588 Other specified disorders of bone density and structure, other site: Secondary | ICD-10-CM | POA: Insufficient documentation

## 2016-07-06 DIAGNOSIS — M85852 Other specified disorders of bone density and structure, left thigh: Secondary | ICD-10-CM | POA: Diagnosis not present

## 2016-07-06 DIAGNOSIS — E118 Type 2 diabetes mellitus with unspecified complications: Secondary | ICD-10-CM | POA: Diagnosis not present

## 2016-07-06 DIAGNOSIS — Z1231 Encounter for screening mammogram for malignant neoplasm of breast: Secondary | ICD-10-CM | POA: Diagnosis not present

## 2016-07-06 DIAGNOSIS — E2839 Other primary ovarian failure: Secondary | ICD-10-CM | POA: Insufficient documentation

## 2016-07-06 NOTE — Progress Notes (Signed)
Pre visit review using our clinic review tool, if applicable. No additional management support is needed unless otherwise documented below in the visit note.  Patient came in office today for glucometer teaching per Patient E-mail note on 07/03/16. RN issued patient a [employer] scientist, consisting of the glucometer, test strips, lancets and quick guide. Also, patient was given a brochure titled "Why Blood Sugar is Important", a carbohydrate guide & self-care diabetes diary. Informed patient of the signs & symptoms of hyper/hypoglycemia.   RN demonstrated how to use the glucometer & dispose needle with teachback. Patient understood the teaching and did not have any questions or concerns before leaving the nurse visit.

## 2016-07-06 NOTE — Progress Notes (Signed)
Yvonne R Lowne Chase, DO 

## 2016-07-08 ENCOUNTER — Encounter: Payer: Self-pay | Admitting: Family

## 2016-07-08 ENCOUNTER — Telehealth: Payer: Self-pay | Admitting: Family

## 2016-07-08 DIAGNOSIS — M8580 Other specified disorders of bone density and structure, unspecified site: Secondary | ICD-10-CM

## 2016-07-08 HISTORY — DX: Other specified disorders of bone density and structure, unspecified site: M85.80

## 2016-07-08 MED ORDER — CALCIUM CARBONATE-VITAMIN D 600-400 MG-UNIT PO TABS: 1.0000 | ORAL_TABLET | Freq: Two times a day (BID) | ORAL | Status: DC

## 2016-07-08 NOTE — Telephone Encounter (Signed)
Please let pt know that the radiologist would like her to complete some additional breast images for further evaluation. Let me know if she has not been contacted by them about a follow up appointment in 1 week.   Also,  bone density shows osteopenia or "bone thinning."  I recommend that you start calcium in the form of of caltrate 600mg  + D one tablet by mouth twice daily. This is available over the counter.  In addition, please ensure regular weight bearing exercise such as walking.

## 2016-07-09 ENCOUNTER — Other Ambulatory Visit: Payer: Self-pay | Admitting: Family

## 2016-07-09 ENCOUNTER — Encounter: Payer: Self-pay | Admitting: Family

## 2016-07-09 DIAGNOSIS — R928 Other abnormal and inconclusive findings on diagnostic imaging of breast: Secondary | ICD-10-CM

## 2016-07-09 MED ORDER — ONETOUCH DELICA LANCETS 33G MISC: 1 refills | Status: DC

## 2016-07-09 MED ORDER — GLUCOSE BLOOD VI STRP: ORAL_STRIP | 1 refills | Status: DC

## 2016-07-09 NOTE — Telephone Encounter (Signed)
Results sent to pt via Email.

## 2016-07-10 ENCOUNTER — Telehealth: Payer: Self-pay | Admitting: *Deleted

## 2016-07-10 NOTE — Telephone Encounter (Signed)
Received Physician Orders from [place], forwarded to provider/SLS 06/26

## 2016-07-11 NOTE — Telephone Encounter (Signed)
Dr. Hilarie Fredrickson reviewed records and has accepted patient. Ok to schedule Direct [facility] for 10/2016. I informed patient that our office will call her when October schedule is available.

## 2016-07-13 ENCOUNTER — Encounter: Payer: Self-pay | Admitting: Family

## 2016-07-13 ENCOUNTER — Ambulatory Visit
Admission: RE | Admit: 2016-07-13 | Discharge: 2016-07-13 | Disposition: A | Discharge status: Home / Self Care | Payer: BLUE CROSS/BLUE SHIELD | Source: Ambulatory Visit | Attending: Family | Admitting: Family

## 2016-07-13 DIAGNOSIS — R928 Other abnormal and inconclusive findings on diagnostic imaging of breast: Secondary | ICD-10-CM

## 2016-07-25 ENCOUNTER — Encounter: Payer: Self-pay | Admitting: Family

## 2016-08-10 ENCOUNTER — Ambulatory Visit: Payer: BLUE CROSS/BLUE SHIELD | Admitting: Registered"

## 2016-08-30 ENCOUNTER — Encounter: Payer: Self-pay | Admitting: Internal Medicine

## 2016-09-04 IMAGING — CR DG ABDOMEN 2V
2 series · 2 of 2 positions shown · non-contrast
Comparison: CT 04/08/2013

CLINICAL DATA: Constipation since elbow surgery on [REDACTED].
Nausea, vomiting.

EXAM:
ABDOMEN - 2 VIEW

[w abdomen upright]
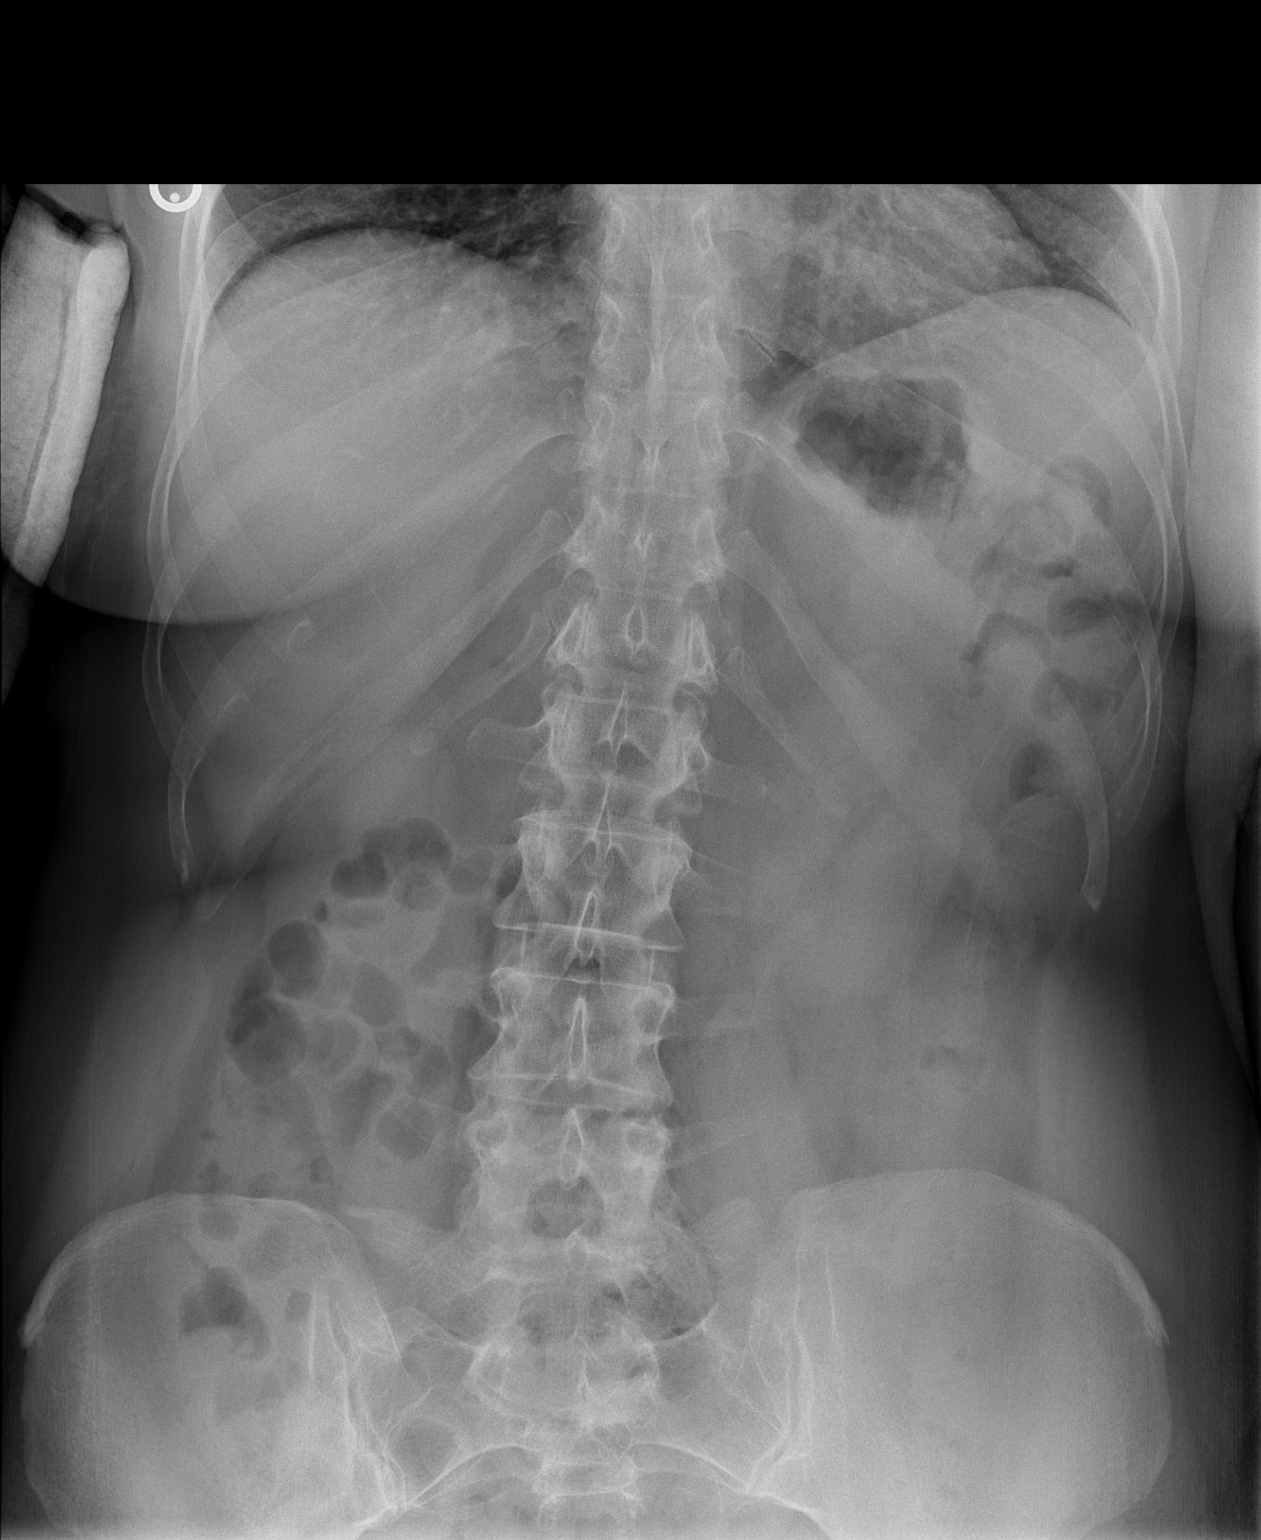

[t abdomen supine]
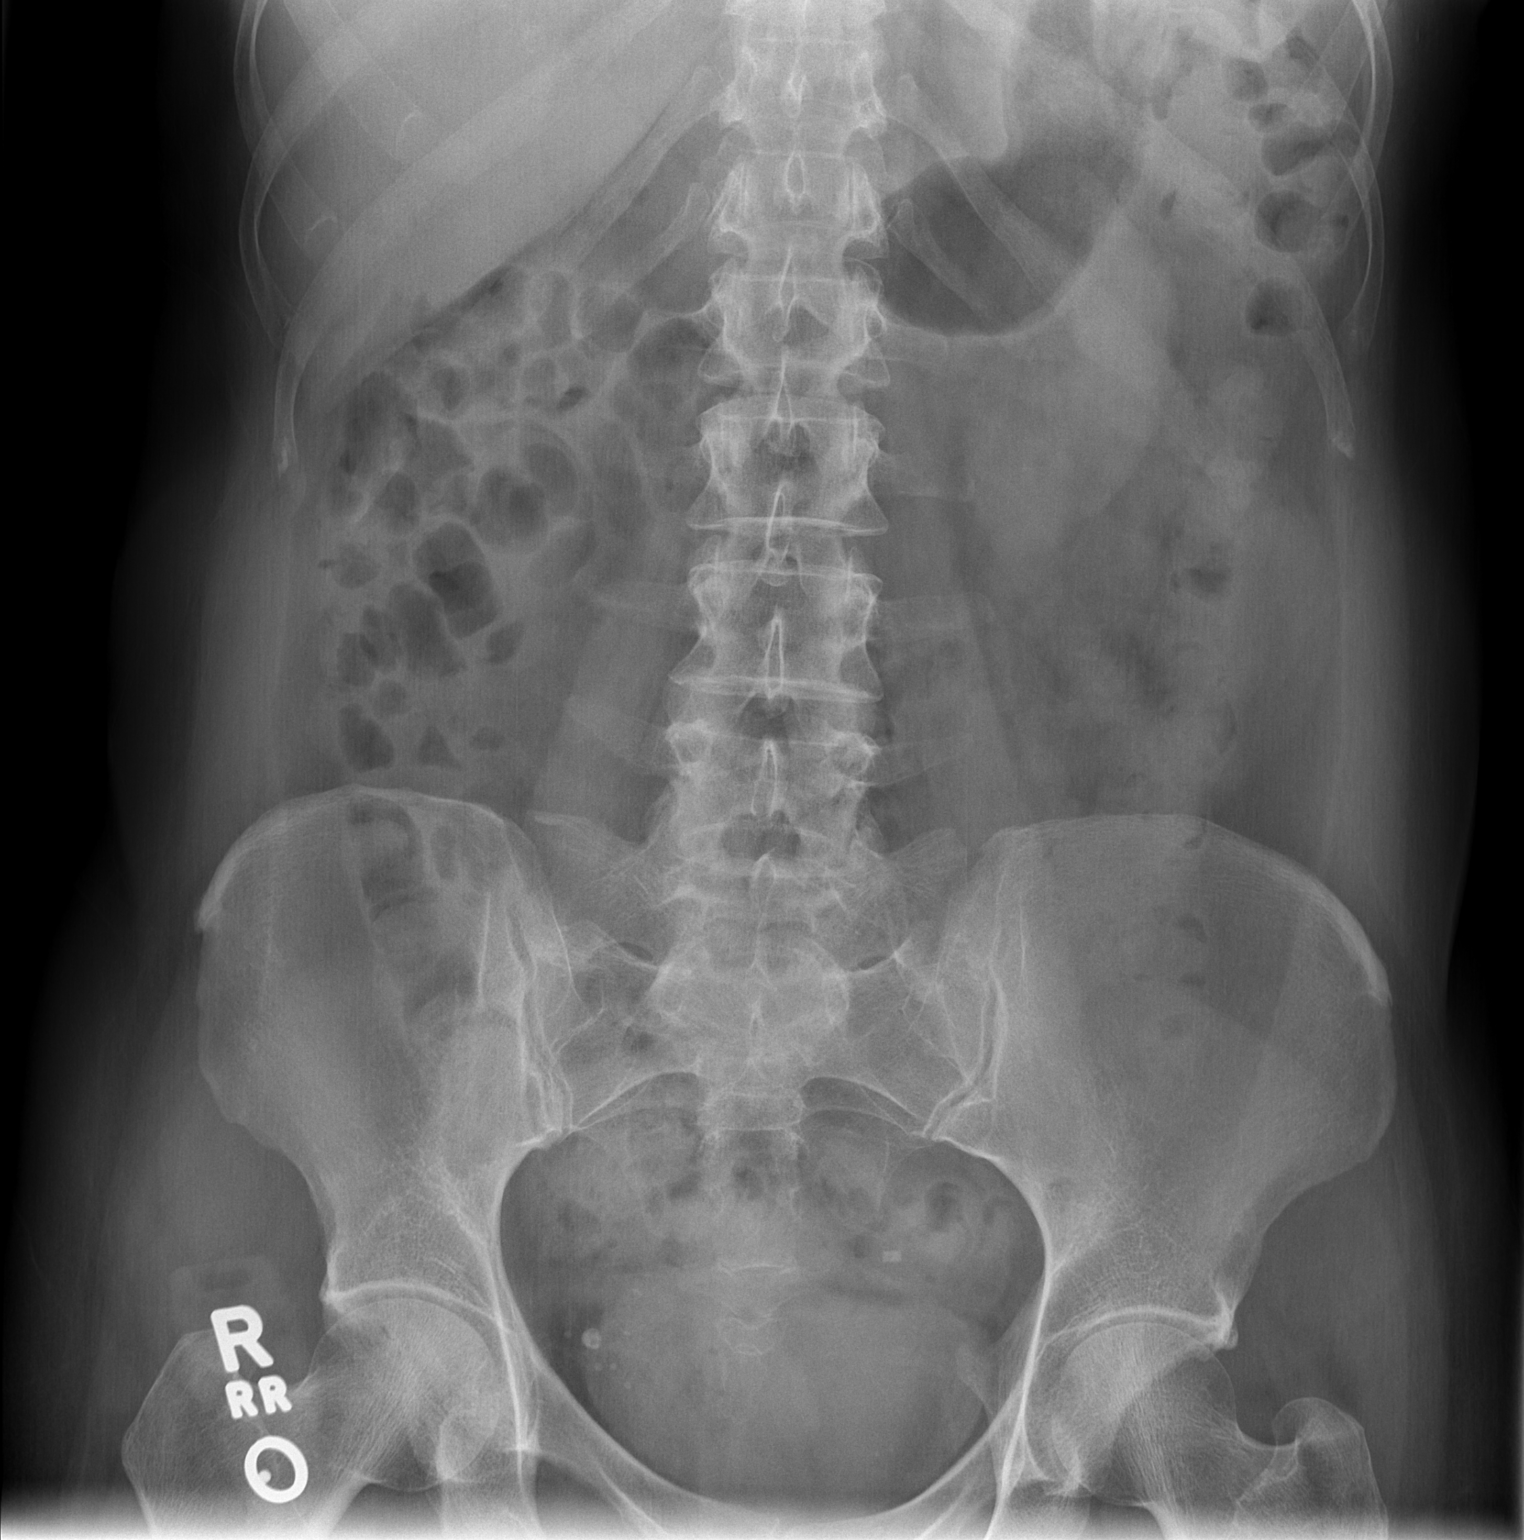

[2 of 2 positions shown; findings below may reference images not displayed]

FINDINGS: There is normal bowel gas pattern. No free air. No organomegaly or
suspicious calcification. No acute bony abnormality. Calcified
phleboliths in the pelvis.
IMPRESSION: No acute findings.

## 2016-09-04 IMAGING — CT CT HEAD W/O CM
1 series · 16 of 30 positions shown, 20 images · non-contrast
Comparison: Cervical spine MRI 04/17/2014.

CLINICAL DATA: 53-year-old female with severe headache since
yesterday. No known injury. Initial encounter.

EXAM:
CT HEAD WITHOUT CONTRAST
TECHNIQUE: Contiguous axial images were obtained from the base of the skull
through the vertex without intravenous contrast.

[Series 2: head 4.8 h37s · axial · 0.40mm/px · z∈[-80,+56]mm · 16 of 32 slices shown, 20 images]
[im 2/32  brain]
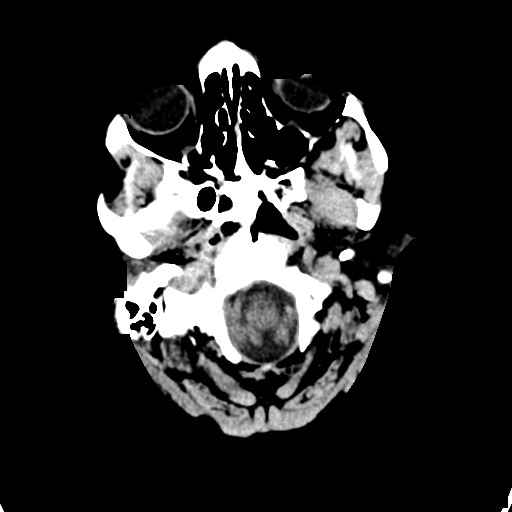
[im 2/32  bone]
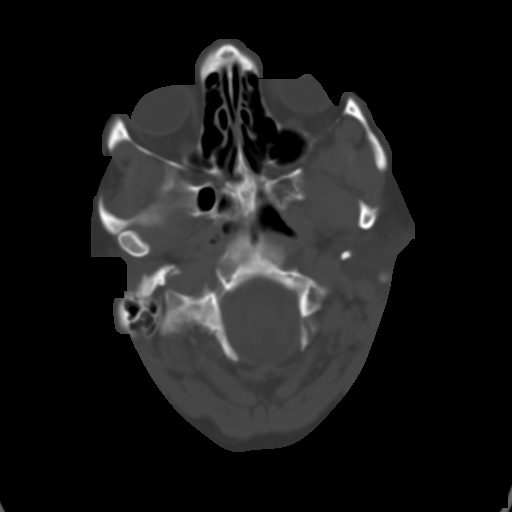
[im 4/32  brain]
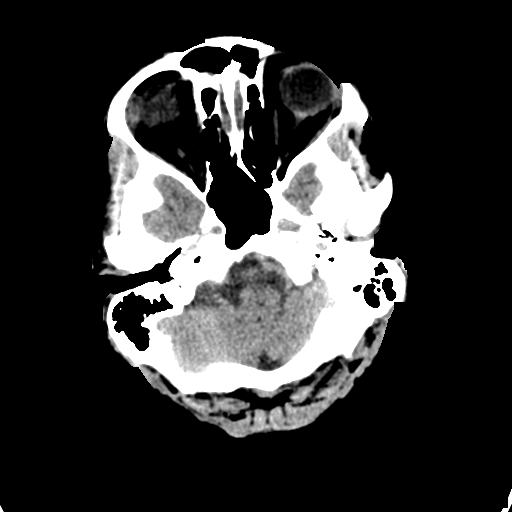
[im 6/32  brain]
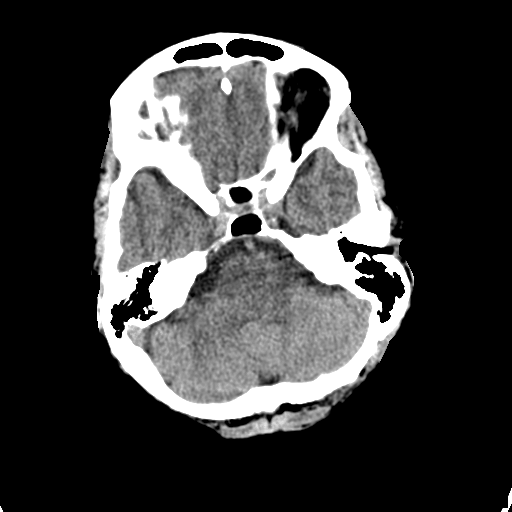
[im 8/32  brain]
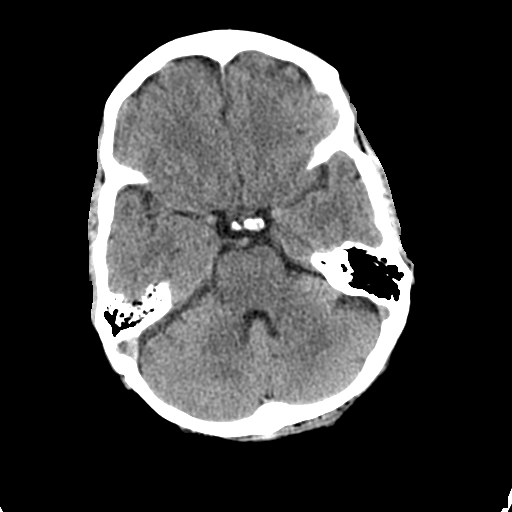
[im 9/32  brain]
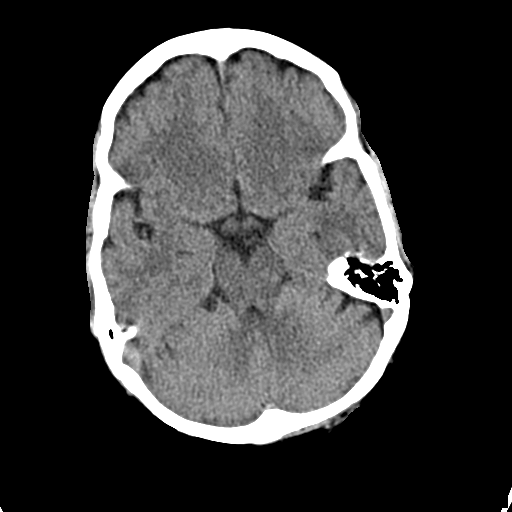
[im 9/32  bone]
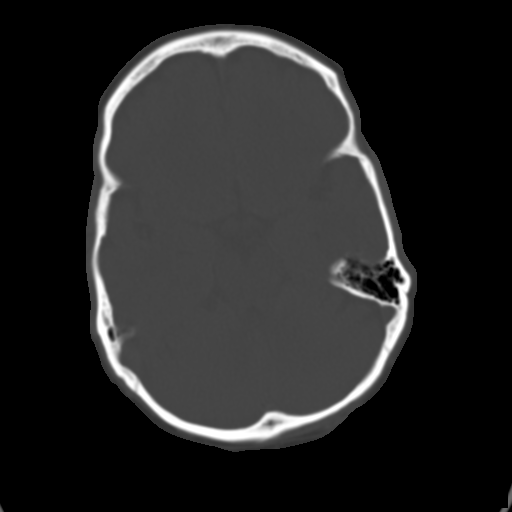
[im 11/32  brain]
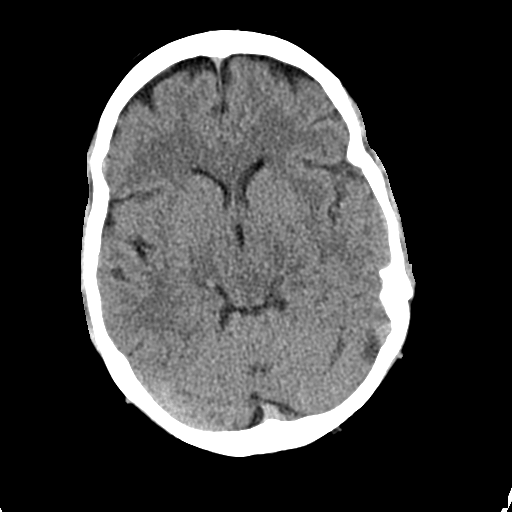
[im 13/32  brain]
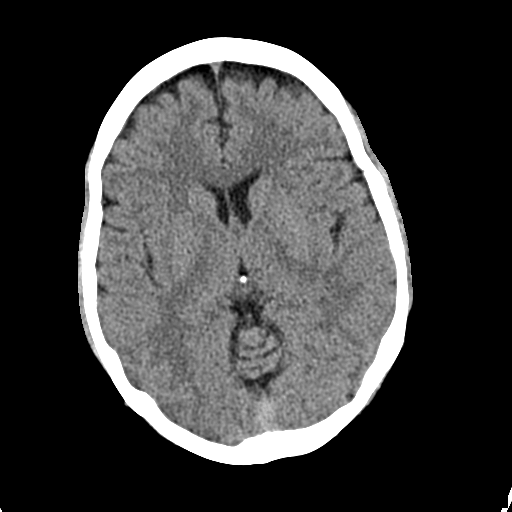
[im 15/32  brain]
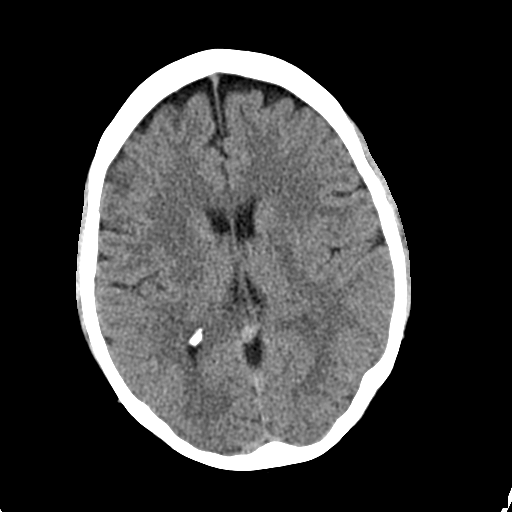
[im 17/32  brain]
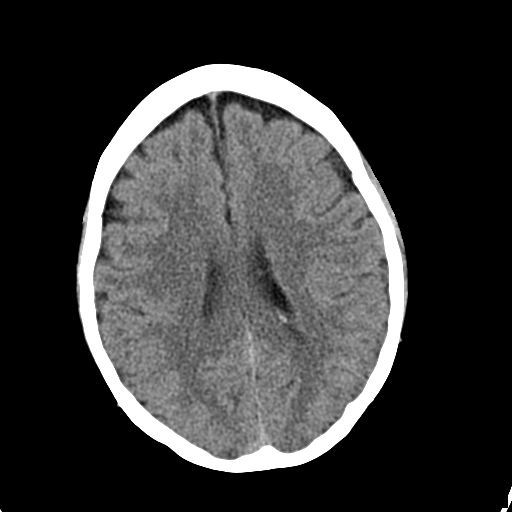
[im 17/32  bone]
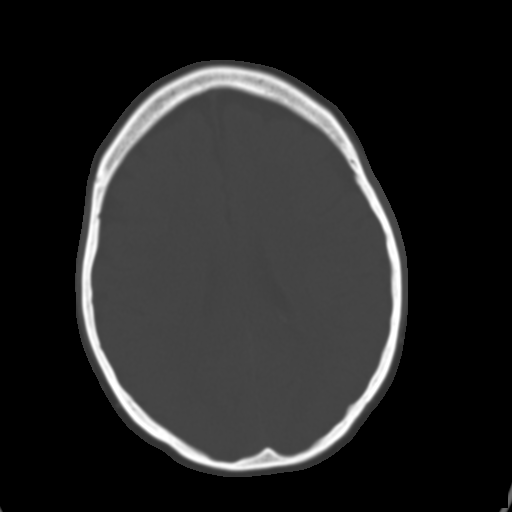
[im 19/32  brain]
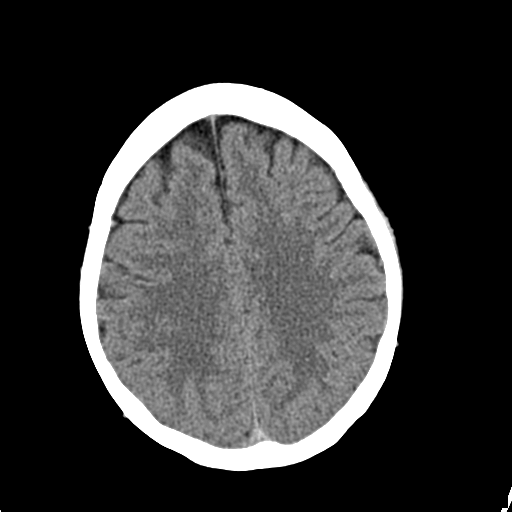
[im 21/32  brain]
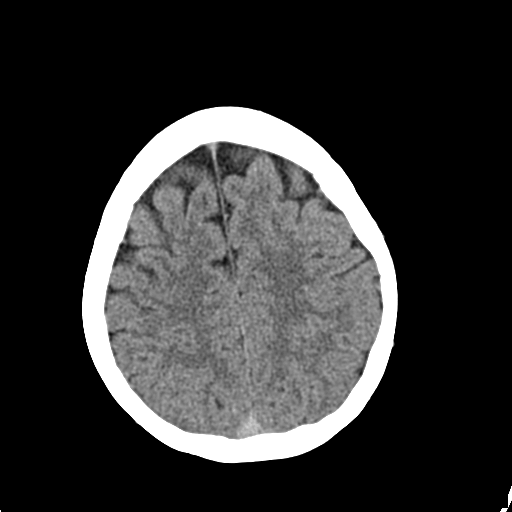
[im 23/32  brain]
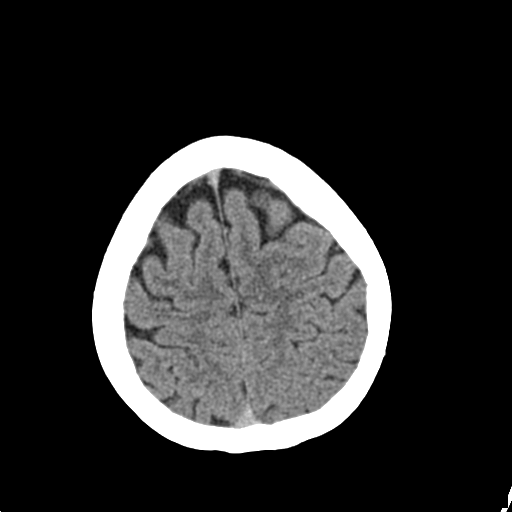
[im 24/32  brain]
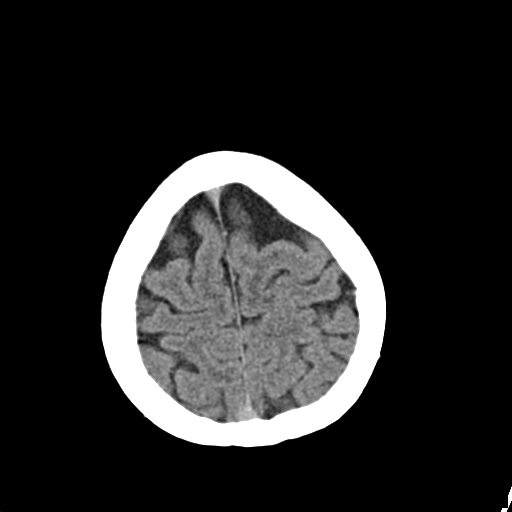
[im 24/32  bone]
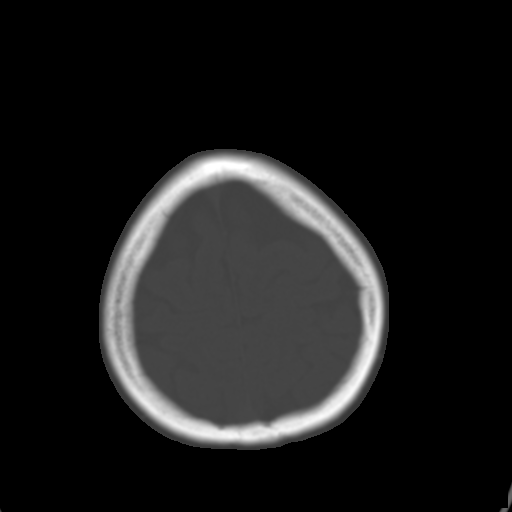
[im 26/32  brain]
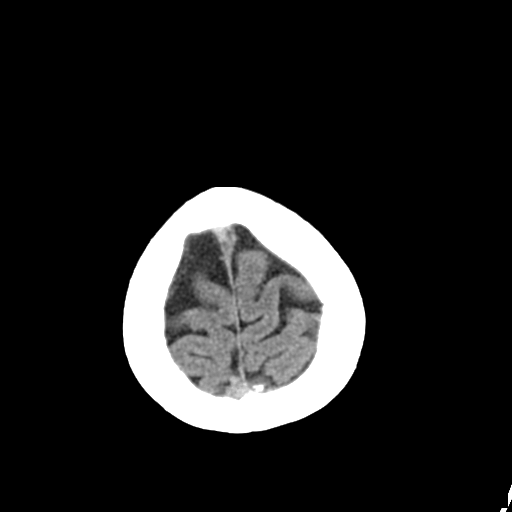
[im 28/32  brain]
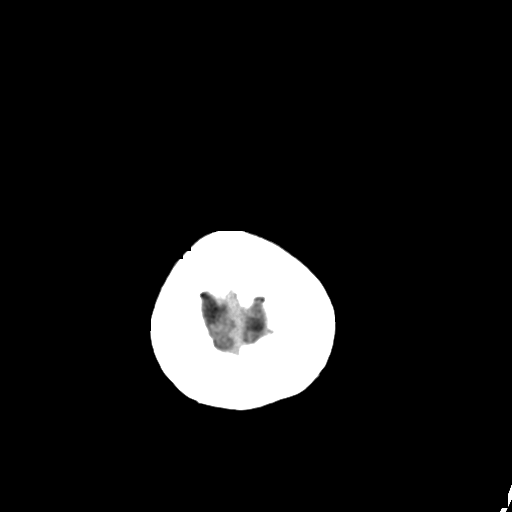
[im 30/32  brain]
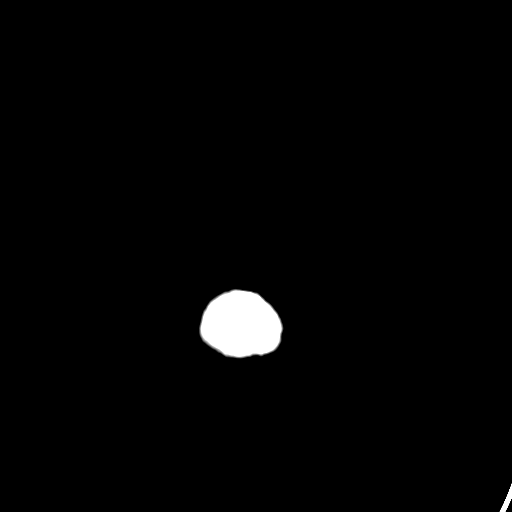

[16 of 30 positions shown; findings below may reference images not displayed]

FINDINGS: Visualized paranasal sinuses and mastoids are clear. No osseous
abnormality identified. Visualized orbits and scalp soft tissues are
within normal limits.

Cerebral volume is normal. No suspicious intracranial vascular
hyperdensity. No midline shift, ventriculomegaly, mass effect,
evidence of mass lesion, intracranial hemorrhage or evidence of
cortically based acute infarction. Gray-white matter differentiation
is within normal limits throughout the brain.
IMPRESSION: Normal noncontrast CT appearance of the brain.

## 2016-09-05 ENCOUNTER — Other Ambulatory Visit: Payer: Self-pay | Admitting: Family

## 2016-09-16 ENCOUNTER — Encounter: Payer: Self-pay | Admitting: Family

## 2016-09-17 ENCOUNTER — Encounter: Payer: Self-pay | Admitting: Family

## 2016-09-18 ENCOUNTER — Other Ambulatory Visit: Payer: Self-pay | Admitting: Family

## 2016-09-18 MED ORDER — NITROFURANTOIN MONOHYD MACRO 100 MG PO CAPS: 100.0000 mg | ORAL_CAPSULE | Freq: Two times a day (BID) | ORAL | 0 refills | Status: DC

## 2016-09-21 ENCOUNTER — Encounter: Payer: Self-pay | Admitting: Family

## 2016-09-21 ENCOUNTER — Ambulatory Visit (INDEPENDENT_AMBULATORY_CARE_PROVIDER_SITE_OTHER): Payer: BLUE CROSS/BLUE SHIELD | Admitting: Family

## 2016-09-21 ENCOUNTER — Telehealth: Payer: Self-pay | Admitting: Family

## 2016-09-21 VITALS — BP 128/82 | HR 72 | Temp 97.8°F | Resp 16 | Ht 63.0 in | Wt 139.4 lb

## 2016-09-21 DIAGNOSIS — E118 Type 2 diabetes mellitus with unspecified complications: Secondary | ICD-10-CM | POA: Diagnosis not present

## 2016-09-21 DIAGNOSIS — F419 Anxiety disorder, unspecified: Secondary | ICD-10-CM

## 2016-09-21 DIAGNOSIS — E1165 Type 2 diabetes mellitus with hyperglycemia: Secondary | ICD-10-CM | POA: Diagnosis not present

## 2016-09-21 DIAGNOSIS — F329 Major depressive disorder, single episode, unspecified: Secondary | ICD-10-CM

## 2016-09-21 DIAGNOSIS — E039 Hypothyroidism, unspecified: Secondary | ICD-10-CM | POA: Diagnosis not present

## 2016-09-21 DIAGNOSIS — IMO0001 Reserved for inherently not codable concepts without codable children: Secondary | ICD-10-CM

## 2016-09-21 DIAGNOSIS — E785 Hyperlipidemia, unspecified: Secondary | ICD-10-CM | POA: Diagnosis not present

## 2016-09-21 DIAGNOSIS — G4700 Insomnia, unspecified: Secondary | ICD-10-CM | POA: Diagnosis not present

## 2016-09-21 DIAGNOSIS — E781 Pure hyperglyceridemia: Secondary | ICD-10-CM

## 2016-09-21 LAB — BASIC METABOLIC PANEL
BUN: 18 mg/dL (ref 6–23)
CHLORIDE: 102 meq/L (ref 96–112)
CO2: 30 mEq/L (ref 19–32)
Calcium: 9.7 mg/dL (ref 8.4–10.5)
Creatinine, Ser: 0.85 mg/dL (ref 0.40–1.20)
GFR: 73.79 mL/min (ref >60.00)
Glucose, Bld: 133 mg/dL — ABNORMAL HIGH (ref 70–99)
POTASSIUM: 5.1 meq/L (ref 3.5–5.1)
SODIUM: 142 meq/L (ref 135–145)

## 2016-09-21 LAB — LDL CHOLESTEROL, DIRECT: Direct LDL: 120 mg/dL

## 2016-09-21 LAB — LIPID PANEL
Cholesterol: 227 mg/dL — ABNORMAL HIGH (ref 0–200)
HDL: 39.4 mg/dL (ref >39.00)
NonHDL: 188.01
Total CHOL/HDL Ratio: 6
Triglycerides: 331 mg/dL — ABNORMAL HIGH (ref 0.0–149.0)
VLDL: 66.2 mg/dL — ABNORMAL HIGH (ref 0.0–40.0)

## 2016-09-21 LAB — HEMOGLOBIN A1C: Hgb A1c MFr Bld: 7.5 % — ABNORMAL HIGH (ref 4.6–6.5)

## 2016-09-21 MED ORDER — METFORMIN HCL 500 MG PO TABS: 500.0000 mg | ORAL_TABLET | Freq: Two times a day (BID) | ORAL | 3 refills | Status: DC

## 2016-09-21 MED ORDER — METFORMIN HCL 1000 MG PO TABS: 1000.0000 mg | ORAL_TABLET | Freq: Two times a day (BID) | ORAL | 5 refills | Status: DC

## 2016-09-21 NOTE — Progress Notes (Signed)
Subjective:    Patient ID: Donna Anderson, female    DOB: [birth year], 56 y.o.   MRN: [id number]  HPI   Donna Anderson is a 56 yr old female who presents today for follow up.  1) anxiety/depression- she is not currently on SSRI or SNRI.    2) hypothyroid- maintained on synthroid.  Reports that she is feeling well on this dose. Lab Results  Component Value Date   TSH 1.85 06/15/2016   3) Insomnia- last visit we gave a trial of trazodone. Reports that this helps her fall asleep.  Still wakes up some but does manage to get back to sleep.    4) DM2- she is taking metformin 500mg  bid. Reports sugar this AM was 152. Ranging 137-175 fasting.   Lab Results  Component Value Date   HGBA1C 8.1 (H) 06/21/2016   Lab Results  Component Value Date   CREATININE 0.77 07/03/2016   5) Hyperlipidemia- Reports that she has been working on a healthier diet.  Reports that she did not know that she was to start atorvastatin and did not begin.   Lab Results  Component Value Date   CHOL 223 (H) 06/15/2016   HDL 35.30 (L) 06/15/2016   LDLDIRECT 109.0 06/15/2016   TRIG (H) 06/15/2016    734.0 Triglyceride is over 400; calculations on Lipids are invalid.   CHOLHDL 6 06/15/2016    6) UTI- called with urinary tract symptoms earlier this week. Rx was sent for macrobid.     Review of Systems See HPI  Past Medical History:  Diagnosis Date  . Arthritis   . Depression   . Diabetes type 2, controlled ([place]) 06/21/2016  . Diverticulosis 2015   CT SCAN   . Graves disease 1997  . Hearing loss   . History of chicken pox   . History of migraine   . Hyperlipidemia   . Hypertriglyceridemia 06/21/2016  . Kidney stones   . Osteopenia 07/08/2016     Social History   Social History  . Marital status: Married    Spouse name: N/A  . Number of children: N/A  . Years of education: N/A   Occupational History  . Not on file.   Social History Main Topics  . Smoking status: Former Smoker    Years: 15.00     Quit date: 01/16/1999  . Smokeless tobacco: Never Used  . Alcohol use 0.0 oz/week     Comment: occasional  . Drug use: No  . Sexual activity: Yes    Birth control/ protection: None   Other Topics Concern  . Not on file   Social History Narrative   Married (second marriage) has one son in [place]   Worked in Psychologist, 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    Completed 12th grade   1 dog, 1 cat   Was raised by her grandmother.        Past Surgical History:  Procedure Laterality Date  . APPENDECTOMY  2001  . ELBOW SURGERY Right 2016  . ENDOMETRIAL ABLATION  2003  . HIP FRACTURE SURGERY Right 2009   fell playing with dog  . labrum repair Right 2011  . OOPHORECTOMY Right 2001   due to large ovarian cyst  . THYROIDECTOMY  1997    Family History  Problem Relation Age of Onset  . Hyperlipidemia Father   . Hypertension Father   . Heart failure Father 76  .  Diabetes Sister   . Arthritis Paternal Grandmother   . Kidney disease Paternal Grandmother   . Diabetes Paternal Grandmother   . Arthritis Paternal Grandfather     Allergies  Allergen Reactions  . Tramadol Itching    Current Outpatient Prescriptions on File Prior to Visit  Medication Sig Dispense Refill  . atorvastatin (LIPITOR) 20 MG tablet Take 1 tablet (20 mg total) by mouth daily. 30 tablet 3  . Calcium Carbonate-Vitamin D (CALTRATE 600+D) 600-400 MG-UNIT tablet Take 1 tablet by mouth 2 (two) times daily.    Marland Kitchen glucose blood (ONETOUCH VERIO) test strip Use as instructed to check blood sugar one to two times a day.  DX  E11.9 200 each 1  . levothyroxine (SYNTHROID, LEVOTHROID) 125 MCG tablet TAKE 1 TABLET DAILY BEFORE BREAKFAST 90 tablet 0  . LORazepam (ATIVAN) 0.5 MG tablet Take 1 tablet (0.5 mg total) by mouth 2 (two) times daily as needed for anxiety. 30 tablet 0  . meclizine (ANTIVERT) 25 MG tablet Take 1 tablet (25 mg total) by mouth 3 (three) times  daily as needed for dizziness. 30 tablet 0  . nitrofurantoin, macrocrystal-monohydrate, (MACROBID) 100 MG capsule Take 1 capsule (100 mg total) by mouth 2 (two) times daily. 10 capsule 0  . ONETOUCH DELICA LANCETS 23J MISC Use as instructed to check blood sugar one to two times a day.  DX  E11.9 200 each 1  . traZODone (DESYREL) 50 MG tablet Take 0.5-1 tablets (25-50 mg total) by mouth at bedtime as needed for sleep. 30 tablet 3   No current facility-administered medications on file prior to visit.     BP 128/82 (BP Location: Right Arm, Cuff Size: Normal)   Pulse 72   Temp 97.8 F (36.6 C) (Oral)   Resp 16   Ht 5\' 3"  (1.6 m)   Wt 139 lb 6.4 oz (63.2 kg)   SpO2 98%   BMI 24.69 kg/m       Objective:   Physical Exam  Constitutional: She is oriented to person, place, and time. She appears well-developed and well-nourished.  HENT:  Head: Normocephalic and atraumatic.  Eyes: No scleral icterus.  Cardiovascular: Normal rate, regular rhythm and normal heart sounds.   No murmur heard. Pulmonary/Chest: Effort normal and breath sounds normal. No respiratory distress. She has no wheezes.  Musculoskeletal: She exhibits no edema.  Neurological: She is alert and oriented to person, place, and time.  Psychiatric: She has a normal mood and affect. Her behavior is normal. Judgment and thought content normal.          Assessment & Plan:

## 2016-09-21 NOTE — Telephone Encounter (Signed)
Cholesterol and sugar are better but still above goal. I would recommend that she increase her metformin to 1000 mg twice daily. She can take 2 of the 500 mg tablets twice a day. When that prescription runs out she can switch over to the thousand milligram tablets twice daily. She should start Lipitor as well.

## 2016-09-21 NOTE — Assessment & Plan Note (Signed)
Advised pt to start atorvastatin. Check fasting lipid panel.

## 2016-09-21 NOTE — Assessment & Plan Note (Signed)
Improved with trazodone. Continue same.

## 2016-09-21 NOTE — Assessment & Plan Note (Signed)
Tolerating metformin, obtain follow up A1C.

## 2016-09-21 NOTE — Patient Instructions (Signed)
Please complete lab work prior to leaving.   

## 2016-09-21 NOTE — Assessment & Plan Note (Signed)
Clinically stable on synthroid, continue same.  

## 2016-09-21 NOTE — Assessment & Plan Note (Signed)
Stable.  Monitor.  

## 2016-09-25 MED ORDER — ATORVASTATIN CALCIUM 20 MG PO TABS: 20.0000 mg | ORAL_TABLET | Freq: Every day | ORAL | 0 refills | Status: DC

## 2016-09-25 NOTE — Telephone Encounter (Signed)
Notified pt and she voices understanding. Requests that rx for atorvastatin go to [facility] for a 30 day supply and if she tolerates it well she will call back for [place] to send 90 day supply to mail order. Rx sent to [facility].

## 2016-10-16 ENCOUNTER — Encounter: Payer: Self-pay | Admitting: Family

## 2016-10-19 ENCOUNTER — Encounter: Payer: Self-pay | Admitting: Family Medicine

## 2016-10-19 ENCOUNTER — Ambulatory Visit (INDEPENDENT_AMBULATORY_CARE_PROVIDER_SITE_OTHER): Payer: BLUE CROSS/BLUE SHIELD | Admitting: Family Medicine

## 2016-10-19 VITALS — BP 102/72 | HR 79 | Temp 98.3°F | Ht 62.0 in | Wt 140.2 lb

## 2016-10-19 DIAGNOSIS — M6289 Other specified disorders of muscle: Secondary | ICD-10-CM | POA: Diagnosis not present

## 2016-10-19 DIAGNOSIS — R300 Dysuria: Secondary | ICD-10-CM | POA: Diagnosis not present

## 2016-10-19 LAB — POC URINALSYSI DIPSTICK (AUTOMATED)
BILIRUBIN UA: NEGATIVE
GLUCOSE UA: NEGATIVE
Ketones, UA: NEGATIVE
Leukocytes, UA: NEGATIVE
NITRITE UA: NEGATIVE
Protein, UA: NEGATIVE
RBC UA: NEGATIVE
Spec Grav, UA: 1.01 (ref 1.010–1.025)
Urobilinogen, UA: 0.2 E.U./dL
pH, UA: 6 (ref 5.0–8.0)

## 2016-10-19 MED ORDER — NAPROXEN 500 MG PO TBEC: 500.0000 mg | DELAYED_RELEASE_TABLET | Freq: Two times a day (BID) | ORAL | 0 refills | Status: DC

## 2016-10-19 MED ORDER — CYCLOBENZAPRINE HCL 5 MG PO TABS: 5.0000 mg | ORAL_TABLET | Freq: Three times a day (TID) | ORAL | 0 refills | Status: DC | PRN

## 2016-10-19 MED FILL — CYCLOBENZAPRINE 5 MG TABLET: 5 | 10 days supply | Qty: 30 | Fill #0

## 2016-10-19 MED FILL — NAPROXEN DR 500 MG TABLET: 500 | 15 days supply | Qty: 30 | Fill #0

## 2016-10-19 NOTE — Progress Notes (Signed)
Pre visit review using our clinic review tool, if applicable. No additional management support is needed unless otherwise documented below in the visit note. 

## 2016-10-19 NOTE — Progress Notes (Signed)
Chief Complaint  Patient presents with  . Dysuria  . Abdominal Pain  . Back Pain    Donna Anderson is a 56 y.o. female here for possible UTI.  Duration: 1 month. Started after a pelvic exam, painful insertion of speculum.  Symptoms: urinary hesitancy and dysuria Denies: hematuria, urinary retention, fever, nausea and vomiting, vaginal discharge Hx of recurrent UTI? No Denies new sexual partners. She was tx'd with macrobid and did not note improvement.   ROS:  Constitutional: denies fever  GU: As noted in HPI Abd: +straining, lower abd pain  Past Medical History:  Diagnosis Date  . Arthritis   . Depression   . Diabetes type 2, controlled ([place]) 06/21/2016  . Diverticulosis 2015   CT SCAN   . Graves disease 1997  . Hearing loss   . History of chicken pox   . History of migraine   . Hyperlipidemia   . Hypertriglyceridemia 06/21/2016  . Kidney stones   . Osteopenia 07/08/2016   Family History  Problem Relation Age of Onset  . Hyperlipidemia Father   . Hypertension Father   . Heart failure Father 77  . Diabetes Sister   . Arthritis Paternal Grandmother   . Kidney disease Paternal Grandmother   . Diabetes Paternal Grandmother   . Arthritis Paternal Grandfather    Social History   Social History  . Marital status: Married   Social History Main Topics  . Smoking status: Former Smoker    Years: 15.00    Quit date: 01/16/1999  . Smokeless tobacco: Never Used  . Alcohol use 0.0 oz/week     Comment: occasional  . Drug use: No  . Sexual activity: Yes    Birth control/ protection: None   Social History Narrative   Married (second marriage) has one son in [place] in [employer], 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    Completed 12th grade   1 dog, 1 cat   Was raised by her grandmother.        BP 102/72 (BP Location: Left Arm, Patient Position: Sitting, Cuff Size: Normal)   Pulse 79   Temp 98.3 F  (36.8 C) (Oral)   Ht 5\' 2"  (1.575 m)   Wt 140 lb 4 oz (63.6 kg)   SpO2 97%   BMI 25.65 kg/m  General: Awake, alert, appears stated age HEENT: MMM Heart: RRR, no murmurs Lungs: CTAB, normal respiratory effort, no accessory muscle usage Abd: BS+, soft, NT, ND, no masses or organomegaly MSK: No CVA tenderness, neg Lloyd's sign Psych: Age appropriate judgment and insight  Dysuria - Plan: POCT Urinalysis Dipstick (Automated), Urine Culture  Pelvic floor dysfunction - Plan: naproxen (EC NAPROSYN) 500 MG EC tablet, cyclobenzaprine (FLEXERIL) 5 MG tablet, POCT Urinalysis Dipstick (Automated), Urine Culture  Orders as above. UA WNL today. Will culture.  Based on the hx, I think this could be related to pelvic floor dysfunction and spasm. Will trial NSAIDs and muscle relaxant. If no improvement, would like her to consider pelvic floor PT, though she stated she was not thrilled with the idea of that. I gave her some Kegel's to work out the area a bit. Take muscle relaxant 1-2 hours before bed and not during day if it makes her drowsy. F/u in 7 days if symptoms fail to improve, sooner if things worsen. The patient voiced understanding and agreement to the plan.  [place], DO 10/19/16 11:13  AM

## 2016-10-19 NOTE — Patient Instructions (Addendum)
Kegel Exercises Kegel exercises help strengthen the muscles that support the rectum, vagina, small intestine, bladder, and uterus. Doing Kegel exercises can help:  Improve bladder and bowel control.  Improve sexual response.  Reduce problems and discomfort during pregnancy.  Kegel exercises involve squeezing your pelvic floor muscles, which are the same muscles you squeeze when you try to stop the flow of urine. The exercises can be done while sitting, standing, or lying down, but it is best to vary your position. Phase 1 exercises 1. Squeeze your pelvic floor muscles tight. You should feel a tight lift in your rectal area. If you are a female, you should also feel a tightness in your vaginal area. Keep your stomach, buttocks, and legs relaxed. 2. Hold the muscles tight for up to 10 seconds. 3. Relax your muscles. Repeat this exercise 50 times a day or as many times as told by your health care provider. Continue to do this exercise for at least 4-6 weeks or for as long as told by your health care provider. This information is not intended to replace advice given to you by your health care provider. Make sure you discuss any questions you have with your health care provider. Document Released: 12/19/2011 Document Revised: 08/27/2015 Document Reviewed: 11/21/2014 Elsevier Interactive Patient Education  2018 [place] me a MyChart message in 1 week if you are not getting better. If you are getting worse, contact us sooner or return.  Let us know if you need anything.

## 2016-10-20 LAB — URINE CULTURE
MICRO NUMBER:: 81110223
Result:: NO GROWTH
SPECIMEN QUALITY: ADEQUATE

## 2016-10-23 ENCOUNTER — Encounter: Payer: Self-pay | Admitting: Family

## 2016-10-23 MED ORDER — ATORVASTATIN CALCIUM 20 MG PO TABS: 20.0000 mg | ORAL_TABLET | Freq: Every day | ORAL | 2 refills | Status: DC

## 2016-10-23 MED ORDER — TRAZODONE HCL 50 MG PO TABS: 25.0000 mg | ORAL_TABLET | Freq: Every evening | ORAL | 2 refills | Status: DC | PRN

## 2016-10-26 ENCOUNTER — Ambulatory Visit (AMBULATORY_SURGERY_CENTER): Payer: Self-pay | Admitting: *Deleted

## 2016-10-26 VITALS — Ht 63.0 in | Wt 142.6 lb

## 2016-10-26 DIAGNOSIS — Z86010 Personal history of colonic polyps: Secondary | ICD-10-CM

## 2016-10-26 MED ORDER — NA SULFATE-K SULFATE-MG SULF 17.5-3.13-1.6 GM/177ML PO SOLN: 1.0000 [IU] | Freq: Once | ORAL | 0 refills | Status: AC

## 2016-10-26 NOTE — Progress Notes (Signed)
No egg or soy allergy known to patient  No issues with past sedation with any surgeries  or procedures, no intubation problems  Anesthesia makes her very constipated 2 years ago with elbow surgery No diet pills per patient No home 02 use per patient  No blood thinners per patient  Pt denies issues with constipation  No A fib or A flutter  EMMI video sent to pt's e mail

## 2016-11-08 ENCOUNTER — Telehealth: Payer: Self-pay

## 2016-11-08 NOTE — Telephone Encounter (Signed)
Patient called in. States she has a Colonoscopy tomorrow and is currently on a clear liquid diet. States her BS are down to 59 and ahe is jittery and weak. States they will not do procedure tomorrow if BS too low. Would like to know what she can do.  Patient takes Metformin  1000 mg bid.

## 2016-11-08 NOTE — Telephone Encounter (Signed)
She's free to stop it until after the procedure. I would rather she reschedule the colonoscopy than have an episode of hypoglycemia. TY.

## 2016-11-09 ENCOUNTER — Ambulatory Visit (AMBULATORY_SURGERY_CENTER): Payer: BLUE CROSS/BLUE SHIELD | Admitting: Internal Medicine

## 2016-11-09 ENCOUNTER — Encounter: Payer: Self-pay | Admitting: Internal Medicine

## 2016-11-09 VITALS — BP 124/73 | HR 64 | Temp 98.0°F | Resp 12 | Ht 63.0 in | Wt 142.0 lb

## 2016-11-09 DIAGNOSIS — K635 Polyp of colon: Secondary | ICD-10-CM

## 2016-11-09 DIAGNOSIS — D125 Benign neoplasm of sigmoid colon: Secondary | ICD-10-CM

## 2016-11-09 DIAGNOSIS — Z86010 Personal history of colonic polyps: Secondary | ICD-10-CM | POA: Diagnosis not present

## 2016-11-09 MED ORDER — SODIUM CHLORIDE 0.9 % IV SOLN: 500.0000 mL | INTRAVENOUS | Status: DC

## 2016-11-09 NOTE — Progress Notes (Signed)
Called to room to assist during endoscopic procedure.  Patient ID and intended procedure confirmed with present staff. Received instructions for my participation in the procedure from the performing physician.  

## 2016-11-09 NOTE — Progress Notes (Signed)
Report to PACU, RN, vss, BBS= Clear.  

## 2016-11-09 NOTE — Op Note (Signed)
[facility] Patient Name: Donna Anderson Procedure Date: 11/09/2016 11:36 AM MRN: [id number] Endoscopist: Jerene Bears , MD Age: 56 Referring MD:  Date of Birth: [birth year] Gender: Female Account #: [id number] Procedure:                Colonoscopy Indications:              Surveillance: Personal history of adenomatous                            polyps on last colonoscopy 5 years ago Medicines:                Monitored Anesthesia Care Procedure:                Pre-Anesthesia Assessment:                           - Prior to the procedure, a History and Physical                            was performed, and patient medications and                            allergies were reviewed. The patient's tolerance of                            previous anesthesia was also reviewed. The risks                            and benefits of the procedure and the sedation                            options and risks were discussed with the patient.                            All questions were answered, and informed consent                            was obtained. Prior Anticoagulants: The patient has                            taken no previous anticoagulant or antiplatelet                            agents. ASA Grade Assessment: II - A patient with                            mild systemic disease. After reviewing the risks                            and benefits, the patient was deemed in                            satisfactory condition to undergo the procedure.  After obtaining informed consent, the colonoscope                            was passed under direct vision. Throughout the                            procedure, the patient's blood pressure, pulse, and                            oxygen saturations were monitored continuously. The                            Colonoscope was introduced through the anus and                            advanced to the the cecum,  identified by                            appendiceal orifice and ileocecal valve. The                            colonoscopy was performed without difficulty. The                            patient tolerated the procedure well. The quality                            of the bowel preparation was good. The ileocecal                            valve, appendiceal orifice, and rectum were                            photographed. Scope In: 11:51:50 AM Scope Out: 12:05:35 PM Scope Withdrawal Time: 0 hours 9 minutes 38 seconds  Total Procedure Duration: 0 hours 13 minutes 45 seconds  Findings:                 The digital rectal exam was normal.                           A 6 mm polyp was found in the sigmoid colon. The                            polyp was sessile. The polyp was removed with a                            cold snare. Resection and retrieval were complete.                           Multiple small and large-mouthed diverticula were                            found in the sigmoid colon and descending colon.  Internal hemorrhoids were found during                            retroflexion. The hemorrhoids were small. Complications:            No immediate complications. Estimated Blood Loss:     Estimated blood loss was minimal. Impression:               - One 6 mm polyp in the sigmoid colon, removed with                            a cold snare. Resected and retrieved.                           - Moderate diverticulosis in the sigmoid colon and                            in the descending colon.                           - Small internal hemorrhoids. Recommendation:           - Patient has a contact number available for                            emergencies. The signs and symptoms of potential                            delayed complications were discussed with the                            patient. Return to normal activities tomorrow.                             Written discharge instructions were provided to the                            patient.                           - Resume previous diet.                           - Continue present medications.                           - Await pathology results.                           - Repeat colonoscopy is recommended for                            surveillance. The colonoscopy date will be                            determined after pathology results from today's  exam become available for review. Jerene Bears, MD 11/09/2016 12:10:15 PM This report has been signed electronically.

## 2016-11-09 NOTE — Patient Instructions (Signed)
Information on polyps and diverticulosis and hemorrhoids  given to you today   Await pathology on polyps removed today   YOU HAD AN ENDOSCOPIC PROCEDURE TODAY AT [facility]:   Refer to the procedure report that was given to you for any specific questions about what was found during the examination.  If the procedure report does not answer your questions, please call your gastroenterologist to clarify.  If you requested that your care partner not be given the details of your procedure findings, then the procedure report has been included in a sealed envelope for you to review at your convenience later.  YOU SHOULD EXPECT: Some feelings of bloating in the abdomen. Passage of more gas than usual.  Walking can help get rid of the air that was put into your GI tract during the procedure and reduce the bloating. If you had a lower endoscopy (such as a colonoscopy or flexible sigmoidoscopy) you may notice spotting of blood in your stool or on the toilet paper. If you underwent a bowel prep for your procedure, you may not have a normal bowel movement for a few days.  Please Note:  You might notice some irritation and congestion in your nose or some drainage.  This is from the oxygen used during your procedure.  There is no need for concern and it should clear up in a day or so.  SYMPTOMS TO REPORT IMMEDIATELY:   Following lower endoscopy (colonoscopy or flexible sigmoidoscopy):  Excessive amounts of blood in the stool  Significant tenderness or worsening of abdominal pains  Swelling of the abdomen that is new, acute  Fever of 100F or higher   For urgent or emergent issues, a gastroenterologist can be reached at any hour by calling [phone].   DIET:  We do recommend a small meal at first, but then you may proceed to your regular diet.  Drink plenty of fluids but you should avoid alcoholic beverages for 24 hours.  ACTIVITY:  You should plan to take it easy for the rest of  today and you should NOT DRIVE or use heavy machinery until tomorrow (because of the sedation medicines used during the test).    FOLLOW UP: Our staff will call the number listed on your records the next business day following your procedure to check on you and address any questions or concerns that you may have regarding the information given to you following your procedure. If we do not reach you, we will leave a message.  However, if you are feeling well and you are not experiencing any problems, there is no need to return our call.  We will assume that you have returned to your regular daily activities without incident.  If any biopsies were taken you will be contacted by phone or by letter within the next 1-3 weeks.  Please call us at [phone] if you have not heard about the biopsies in 3 weeks.    SIGNATURES/CONFIDENTIALITY: You and/or your care partner have signed paperwork which will be entered into your electronic medical record.  These signatures attest to the fact that that the information above on your After Visit Summary has been reviewed and is understood.  Full responsibility of the confidentiality of this discharge information lies with you and/or your care-partner.

## 2016-11-12 ENCOUNTER — Telehealth: Payer: Self-pay | Admitting: *Deleted

## 2016-11-12 NOTE — Telephone Encounter (Signed)
  Follow up Call-  Call back number 11/09/2016  Post procedure Call Back phone  # [phone]  Permission to leave phone message Yes  Some recent data might be hidden     Patient questions:  Do you have a fever, pain , or abdominal swelling? No. Pain Score  0 *  Have you tolerated food without any problems? Yes.    Have you been able to return to your normal activities? Yes.    Do you have any questions about your discharge instructions: Diet   No. Medications  No. Follow up visit  No.  Do you have questions or concerns about your Care? No.  Actions: * If pain score is 4 or above: No action needed, pain <4.

## 2016-11-15 ENCOUNTER — Encounter: Payer: Self-pay | Admitting: Internal Medicine

## 2016-12-05 ENCOUNTER — Other Ambulatory Visit: Payer: Self-pay | Admitting: Family

## 2016-12-17 ENCOUNTER — Encounter: Payer: Self-pay | Admitting: Family

## 2016-12-17 MED ORDER — ATORVASTATIN CALCIUM 20 MG PO TABS: 20.0000 mg | ORAL_TABLET | Freq: Every day | ORAL | 1 refills | Status: DC

## 2016-12-17 MED ORDER — METFORMIN HCL 1000 MG PO TABS: 1000.0000 mg | ORAL_TABLET | Freq: Two times a day (BID) | ORAL | 1 refills | Status: DC

## 2016-12-17 MED ORDER — TRAZODONE HCL 50 MG PO TABS: 25.0000 mg | ORAL_TABLET | Freq: Every evening | ORAL | 1 refills | Status: DC | PRN

## 2016-12-17 NOTE — Telephone Encounter (Signed)
Melissa-- I have sent atorvastatin to mail order. Is it ok to send trazodone to mail order?

## 2016-12-18 ENCOUNTER — Encounter: Payer: Self-pay | Admitting: Family

## 2016-12-21 MED ORDER — GLUCOSE BLOOD VI STRP: ORAL_STRIP | 1 refills | Status: DC

## 2016-12-21 MED ORDER — ONETOUCH DELICA LANCETS 33G MISC: 1 refills | Status: DC

## 2017-01-02 ENCOUNTER — Other Ambulatory Visit: Payer: Self-pay | Admitting: Family

## 2017-01-02 ENCOUNTER — Encounter: Payer: Self-pay | Admitting: Family

## 2017-01-02 MED ORDER — SITAGLIPTIN PHOSPHATE 100 MG PO TABS: 100.0000 mg | ORAL_TABLET | Freq: Every day | ORAL | 5 refills | Status: DC

## 2017-01-04 ENCOUNTER — Encounter: Payer: Self-pay | Admitting: Family

## 2017-02-08 ENCOUNTER — Encounter: Payer: Self-pay | Admitting: Family

## 2017-02-08 NOTE — Telephone Encounter (Signed)
Will patient need appointment? 

## 2017-02-15 ENCOUNTER — Encounter: Payer: Self-pay | Admitting: Family

## 2017-02-15 ENCOUNTER — Ambulatory Visit (INDEPENDENT_AMBULATORY_CARE_PROVIDER_SITE_OTHER): Payer: BLUE CROSS/BLUE SHIELD | Admitting: Family

## 2017-02-15 VITALS — BP 142/77 | HR 61 | Temp 98.0°F | Resp 16 | Ht 62.0 in | Wt 138.0 lb

## 2017-02-15 DIAGNOSIS — G4700 Insomnia, unspecified: Secondary | ICD-10-CM

## 2017-02-15 DIAGNOSIS — R413 Other amnesia: Secondary | ICD-10-CM | POA: Diagnosis not present

## 2017-02-15 MED ORDER — AMITRIPTYLINE HCL 25 MG PO TABS: 25.0000 mg | ORAL_TABLET | Freq: Every day | ORAL | 2 refills | Status: DC

## 2017-02-15 MED FILL — AMITRIPTYLINE HCL 25 MG TAB: 25 | 30 days supply | Qty: 30 | Fill #0

## 2017-02-15 NOTE — Patient Instructions (Addendum)
Stop Trazodone, you can begin elavil instead at bedtime for sleep. You will be contacted about your referral for further memory testing. Complete lab work prior to leaving.

## 2017-02-15 NOTE — Progress Notes (Signed)
Subjective:    Patient ID: Gricelda Foland Diekman, female    DOB: 1960/06/17, 57 y.o.   MRN: [id number]  HPI  Patient is a 57 yr old female who presents today with chief complaint of memory loss. Reports that back in November that she noted that she started to forget how to drive home and had to start using her GPS some days. Also forgot her inlaw's names and even her husband's name. It is started to impact her job because she "can't remember what I can do."    Reports that her mood is OK, small amount of stress. Son (39) and his girlfriend are living with them x 1 month, she does not get along with the Girlfriend.   Insomnia- reports that trazodone helps her fall asleep but she does not stay asleep.    Review of Systems See HPI  Past Medical History:  Diagnosis Date  . Allergy   . Anemia   . Anxiety   . Arthritis   . Depression   . Diabetes type 2, controlled ([place]) 06/21/2016  . Diverticulosis 2015   CT SCAN   . GERD (gastroesophageal reflux disease)   . Graves disease 1997  . Hearing loss   . History of chicken pox   . History of migraine   . Hyperlipidemia   . Hypertriglyceridemia 06/21/2016  . Kidney stones   . Osteopenia 07/08/2016  . Osteoporosis    osteopenia     Social History   Socioeconomic History  . Marital status: Married    Spouse name: Not on file  . Number of children: Not on file  . Years of education: Not on file  . Highest education level: Not on file  Social Needs  . Financial resource strain: Not on file  . Food insecurity - worry: Not on file  . Food insecurity - inability: Not on file  . Transportation needs - medical: Not on file  . Transportation needs - non-medical: Not on file  Occupational History  . Not on file  Tobacco Use  . Smoking status: Former Smoker    Years: 15.00    Last attempt to quit: 01/16/1999    Years since quitting: 18.0  . Smokeless tobacco: Never Used  Substance and Sexual Activity  . Alcohol use: Yes    Alcohol/week:  0.0 oz    Comment: occasional  . Drug use: No  . Sexual activity: Yes    Birth control/protection: None  Other Topics Concern  . 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    Completed 12th grade   1 dog, 1 cat   Was raised by her grandmother.     Past Surgical History:  Procedure Laterality Date  . APPENDECTOMY  2001  . COLONOSCOPY    . ELBOW SURGERY Right 2016  . ENDOMETRIAL ABLATION  2003  . HIP FRACTURE SURGERY Right 2009   fell playing with dog  . labrum repair Right 2011  . NASAL SINUS SURGERY     1998  . OOPHORECTOMY Right 2001   due to large ovarian cyst  . ovarian cysts     removed x 4 before the oophorectomy  . POLYPECTOMY    . THYROIDECTOMY  1997    Family History  Problem Relation Age of Onset  . Hyperlipidemia Father   .  Hypertension Father   . Heart failure Father 36  . Diabetes Sister   . Arthritis Paternal Grandmother   . Kidney disease Paternal Grandmother   . Diabetes Paternal Grandmother   . Arthritis Paternal Grandfather   . Colon cancer Neg Hx   . Colon polyps Neg Hx   . Esophageal cancer Neg Hx   . Rectal cancer Neg Hx   . Stomach cancer Neg Hx     Allergies  Allergen Reactions  . Tramadol Itching    Current Outpatient Medications on File Prior to Visit  Medication Sig Dispense Refill  . acetaminophen (TYLENOL) 500 MG tablet Take 500 mg by mouth every 6 (six) hours as needed.    Marland Kitchen atorvastatin (LIPITOR) 20 MG tablet Take 1 tablet (20 mg total) by mouth daily. 90 tablet 1  . Calcium Carbonate-Vitamin D (CALTRATE 600+D) 600-400 MG-UNIT tablet Take 1 tablet by mouth 2 (two) times daily.    . cyclobenzaprine (FLEXERIL) 5 MG tablet Take 1 tablet (5 mg total) by mouth 3 (three) times daily as needed for muscle spasms. 30 tablet 0  . fluticasone (FLONASE) 50 MCG/ACT nasal spray  Place into both nostrils daily.    Marland Kitchen glucose blood (ONETOUCH VERIO) test strip Use as instructed to check blood sugar one to two times a day.  DX  E11.9 200 each 1  . levothyroxine (SYNTHROID, LEVOTHROID) 125 MCG tablet TAKE 1 TABLET DAILY BEFORE BREAKFAST 90 tablet 0  . meclizine (ANTIVERT) 25 MG tablet Take 1 tablet (25 mg total) by mouth 3 (three) times daily as needed for dizziness. 30 tablet 0  . omeprazole (PRILOSEC) 10 MG capsule Take 10 mg by mouth daily.    Glory Rosebush DELICA LANCETS 56O MISC Use as instructed to check blood sugar one to two times a day.  DX  E11.9 200 each 1  . sitaGLIPtin (JANUVIA) 100 MG tablet Take 1 tablet (100 mg total) by mouth daily. 30 tablet 5   Current Facility-Administered Medications on File Prior to Visit  Medication Dose Route Frequency Provider Last Rate Last Dose  . 0.9 %  sodium chloride infusion  500 mL Intravenous Continuous Pyrtle, Lajuan Lines, MD        BP (!) 142/77 (BP Location: Right Arm, Patient Position: Sitting, Cuff Size: Small)   Pulse 61   Temp 98 F (36.7 C) (Oral)   Resp 16   Ht 5\' 2"  (1.575 m)   Wt 138 lb (62.6 kg)   SpO2 98%   BMI 25.24 kg/m       Objective:   Physical Exam  Constitutional: She is oriented to person, place, and time. She appears well-developed and well-nourished.  HENT:  Head: Normocephalic and atraumatic.  Musculoskeletal: She exhibits no edema.  Neurological: She is alert and oriented to person, place, and time.  Psychiatric: Her behavior is normal. Judgment and thought content normal.  Briefly tearful upon discussion of memory loss          Assessment & Plan:  Memory Loss- pt scored 28/30 on MMSE.  ? If trazodone is contributing. Will d/c trazodone and give trial of elavil for insomnia. Also, discussed obtaining lab work to rule out underling medical cause (check B12/folate, rpr, TSH) and will refer for neurocognitive assessment with neuropsychologist.  I do think that stress/? Depression/anxiety may  be contributing. Advised pt to follow up in 1 month.   Insomnia- d/c trazodone and start elavil. See above.

## 2017-03-03 ENCOUNTER — Encounter: Payer: Self-pay | Admitting: Family

## 2017-03-04 NOTE — Telephone Encounter (Signed)
Spoke with pt and advised her that PCP will need to see her in the office to discuss options that are best for her. She states her lorazepam is out of date but she took one yesterday and it did seem to help calm her down some. States she is not depressed but has a lot going on in her life and is anxious about everything. Feels like she needs to be in control of everything.  Scheduled pt appt for tomorrow at 11:20am with PCP. Advised her if PCP is unable to be back in the office tomorrow to request appt with another Provider if one is available and she voices understanding.

## 2017-03-05 ENCOUNTER — Ambulatory Visit: Payer: BLUE CROSS/BLUE SHIELD | Admitting: Family

## 2017-03-05 ENCOUNTER — Encounter: Payer: Self-pay | Admitting: Family

## 2017-03-05 VITALS — BP 114/79 | HR 74 | Temp 97.7°F | Resp 16 | Ht 62.0 in | Wt 134.0 lb

## 2017-03-05 DIAGNOSIS — R413 Other amnesia: Secondary | ICD-10-CM | POA: Diagnosis not present

## 2017-03-05 DIAGNOSIS — F419 Anxiety disorder, unspecified: Secondary | ICD-10-CM | POA: Diagnosis not present

## 2017-03-05 LAB — B12 AND FOLATE PANEL
Folate: 11.6 ng/mL (ref >5.9)
Vitamin B-12: 264 pg/mL (ref 211–911)

## 2017-03-05 LAB — TSH: TSH: 0.36 u[IU]/mL (ref 0.35–4.50)

## 2017-03-05 MED ORDER — ESCITALOPRAM OXALATE 10 MG PO TABS: ORAL_TABLET | ORAL | 0 refills | Status: DC

## 2017-03-05 MED FILL — ESCITALOPRAM 10 MG TABLET: 10 | 30 days supply | Qty: 30 | Fill #0

## 2017-03-05 NOTE — Patient Instructions (Addendum)
Please complete lab work prior to leaving.  Begin lexapro 1/2 tab once daily for 1 week, the increase to a full tab once daily on week two.

## 2017-03-05 NOTE — Progress Notes (Signed)
Subjective:    Patient ID: Donna Anderson, female    DOB: 1960-10-08, 57 y.o.   MRN: [id number]  HPI   Patient is a 57 year old who presents today for follow-up of anxiety.  We last saw her on 10/21 2019.  At that time her chief complaint was memory loss.  She scored 20 out of 30 on the Mini-Mental Status exam.  Trazodone was discontinued as it was felt that this may be contributing to her memory loss.  She was given a trial of Elavil instead for insomnia.  In addition B12 folate RPR and TSH were all ordered and she was referred for neurocognitive assessment with neuropsychology.  Per chart review it appears she did not complete the ordered lab work.Since discontinuation of trazodone she notes no further issues with memory.  Reports that her stress level is "so bad." Son and his girlfriend are still living with them. She "cannot handle his girlfriend." She hopes that they will move out in the next few weeks. They are helping them get an apartment. She has been on lexapro in the past and tolerated without difficulty. Denies depression.  Reports that she continues to have issues with insomnia and also notes that she has some OCD issues, "everything always needs to be perfect."    Review of Systems    see HPI  Past Medical History:  Diagnosis Date  . Allergy   . Anemia   . Anxiety   . Arthritis   . Depression   . Diabetes type 2, controlled ([place]) 06/21/2016  . Diverticulosis 2015   CT SCAN   . GERD (gastroesophageal reflux disease)   . Graves disease 1997  . Hearing loss   . History of chicken pox   . History of migraine   . Hyperlipidemia   . Hypertriglyceridemia 06/21/2016  . Kidney stones   . Osteopenia 07/08/2016  . Osteoporosis    osteopenia     Social History   Socioeconomic History  . Marital status: Married    Spouse name: Not on file  . Number of children: Not on file  . Years of education: Not on file  . Highest education level: Not on file  Social Needs  .  Financial resource strain: Not on file  . Food insecurity - worry: Not on file  . Food insecurity - inability: Not on file  . Transportation needs - medical: Not on file  . Transportation needs - non-medical: Not on file  Occupational History  . Not on file  Tobacco Use  . Smoking status: Former Smoker    Years: 15.00    Last attempt to quit: 01/16/1999    Years since quitting: 18.1  . Smokeless tobacco: Never Used  Substance and Sexual Activity  . Alcohol use: Yes    Alcohol/week: 0.0 oz    Comment: occasional  . Drug use: No  . Sexual activity: Yes    Birth control/protection: None  Other Topics Concern  . 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    Completed 12th grade   1 dog, 1 cat   Was raised by her grandmother.     Past Surgical History:  Procedure Laterality Date  . APPENDECTOMY  2001  . COLONOSCOPY    . ELBOW SURGERY Right 2016  . ENDOMETRIAL ABLATION  2003  . HIP FRACTURE SURGERY Right 2009   fell playing with dog  . labrum repair Right 2011  . NASAL SINUS SURGERY     1998  . OOPHORECTOMY Right 2001   due to large ovarian cyst  . ovarian cysts     removed x 4 before the oophorectomy  . POLYPECTOMY    . THYROIDECTOMY  1997    Family History  Problem Relation Age of Onset  . Hyperlipidemia Father   . Hypertension Father   . Heart failure Father 37  . Diabetes Sister   . Arthritis Paternal Grandmother   . Kidney disease Paternal Grandmother   . Diabetes Paternal Grandmother   . Arthritis Paternal Grandfather   . Colon cancer Neg Hx   . Colon polyps Neg Hx   . Esophageal cancer Neg Hx   . Rectal cancer Neg Hx   . Stomach cancer Neg Hx     Allergies  Allergen Reactions  . Tramadol Itching    Current Outpatient Medications on File Prior to Visit  Medication Sig Dispense Refill    . acetaminophen (TYLENOL) 500 MG tablet Take 500 mg by mouth every 6 (six) hours as needed.    Marland Kitchen amitriptyline (ELAVIL) 25 MG tablet Take 1 tablet (25 mg total) by mouth at bedtime. 30 tablet 2  . atorvastatin (LIPITOR) 20 MG tablet Take 1 tablet (20 mg total) by mouth daily. 90 tablet 1  . Calcium Carbonate-Vitamin D (CALTRATE 600+D) 600-400 MG-UNIT tablet Take 1 tablet by mouth 2 (two) times daily.    . cyclobenzaprine (FLEXERIL) 5 MG tablet Take 1 tablet (5 mg total) by mouth 3 (three) times daily as needed for muscle spasms. 30 tablet 0  . fluticasone (FLONASE) 50 MCG/ACT nasal spray Place into both nostrils daily.    Marland Kitchen glucose blood (ONETOUCH VERIO) test strip Use as instructed to check blood sugar one to two times a day.  DX  E11.9 200 each 1  . levothyroxine (SYNTHROID, LEVOTHROID) 125 MCG tablet TAKE 1 TABLET DAILY BEFORE BREAKFAST 90 tablet 0  . meclizine (ANTIVERT) 25 MG tablet Take 1 tablet (25 mg total) by mouth 3 (three) times daily as needed for dizziness. 30 tablet 0  . omeprazole (PRILOSEC) 10 MG capsule Take 10 mg by mouth daily.    Glory Rosebush DELICA LANCETS 56D MISC Use as instructed to check blood sugar one to two times a day.  DX  E11.9 200 each 1  . sitaGLIPtin (JANUVIA) 100 MG tablet Take 1 tablet (100 mg total) by mouth daily. 30 tablet 5   Current Facility-Administered Medications on File Prior to Visit  Medication Dose Route Frequency Provider Last Rate Last Dose  . 0.9 %  sodium chloride infusion  500 mL Intravenous Continuous Pyrtle, Lajuan Lines, MD        BP 114/79 (BP Location: Right Arm, Patient Position: Sitting)   Pulse 74   Temp 97.7 F (36.5 C) (Oral)   Resp 16   Ht 5\' 2"  (1.575 m)   Wt 134 lb (60.8 kg)   SpO2 100%   BMI 24.51 kg/m    Objective:   Physical Exam  Constitutional: She is oriented to person, place, and time. She appears well-developed and well-nourished.  Neurological: She is alert and oriented to person, place, and time.  Psychiatric: Her  behavior is normal. Judgment and thought content normal.  Moderately anxious           Assessment & Plan:  Anxiety- uncontrolled.  Will restart lexapro 10mg . I instructed pt to start 1/2 tablet once daily for 1 week and then increase to a full tablet once daily on week two as tolerated.  We discussed common side effects such as nausea, drowsiness and weight gain.  Also discussed rare but serious side effect of suicide ideation.  She is instructed to discontinue medication go directly to ED if this occurs.  Pt verbalizes understanding.  Plan follow up in 1 month to evaluate progress.    Memory loss- improved per patient. B12/folate and TSH are performed today and are WNL. RPR pending. She has upcoming appointment with neurology which I still think will be valuable for her to complete her work up.

## 2017-03-06 ENCOUNTER — Encounter: Payer: Self-pay | Admitting: Family

## 2017-03-06 LAB — RPR: RPR Ser Ql: NONREACTIVE

## 2017-03-07 ENCOUNTER — Other Ambulatory Visit: Payer: Self-pay | Admitting: Family

## 2017-03-29 ENCOUNTER — Ambulatory Visit: Payer: BLUE CROSS/BLUE SHIELD | Admitting: Family

## 2017-03-29 ENCOUNTER — Encounter: Payer: Self-pay | Admitting: Family

## 2017-03-29 VITALS — BP 116/80 | HR 84 | Temp 98.2°F | Resp 16 | Ht 62.0 in | Wt 138.6 lb

## 2017-03-29 DIAGNOSIS — F419 Anxiety disorder, unspecified: Secondary | ICD-10-CM

## 2017-03-29 MED ORDER — ESCITALOPRAM OXALATE 10 MG PO TABS: 10.0000 mg | ORAL_TABLET | Freq: Every day | ORAL | 1 refills | Status: DC

## 2017-03-29 MED ORDER — ESCITALOPRAM OXALATE 10 MG PO TABS
10.0000 mg | ORAL_TABLET | Freq: Every day | ORAL | 0 refills | Status: DC
Start: 2017-03-29 — End: 2017-07-04

## 2017-03-29 MED ORDER — ESCITALOPRAM OXALATE 10 MG PO TABS: 10.0000 mg | ORAL_TABLET | Freq: Every day | ORAL | 0 refills | Status: DC

## 2017-03-29 NOTE — Progress Notes (Signed)
Subjective:    Patient ID: Donna Anderson, female    DOB: [birth year], 57 y.o.   MRN: [id number]  HPI   Patient is a 57 yr old female who presents today for follow up.  Anxiety-last visit we started Lexapro 10 mg.  She was having significant anxiety at home.  She had her son and his girlfriend living with her.  She was not getting along well with his girlfriend.  The plan at that time was for them to move out. Reports less anxiety, denies panic attacks or side effects.  Reports that her son and his girlfriend will be moving out at the end of the month.    Review of Systems    see HPI  Past Medical History:  Diagnosis Date  . Allergy   . Anemia   . Anxiety   . Arthritis   . Depression   . Diabetes type 2, controlled ([place]) 06/21/2016  . Diverticulosis 2015   CT SCAN   . GERD (gastroesophageal reflux disease)   . Graves disease 1997  . Hearing loss   . History of chicken pox   . History of migraine   . Hyperlipidemia   . Hypertriglyceridemia 06/21/2016  . Kidney stones   . Osteopenia 07/08/2016  . Osteoporosis    osteopenia     Social History   Socioeconomic History  . Marital status: Married    Spouse name: Not on file  . Number of children: Not on file  . Years of education: Not on file  . Highest education level: Not on file  Social Needs  . Financial resource strain: Not on file  . Food insecurity - worry: Not on file  . Food insecurity - inability: Not on file  . Transportation needs - medical: Not on file  . Transportation needs - non-medical: Not on file  Occupational History  . Not on file  Tobacco Use  . Smoking status: Former Smoker    Years: 15.00    Last attempt to quit: 01/16/1999    Years since quitting: 18.2  . Smokeless tobacco: Never Used  Substance and Sexual Activity  . Alcohol use: Yes    Alcohol/week: 0.0 oz    Comment: occasional  . Drug use: No  . Sexual activity: Yes    Birth control/protection: None  Other Topics Concern  . 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    Completed 12th grade   1 dog, 1 cat   Was raised by her grandmother.     Past Surgical History:  Procedure Laterality Date  . APPENDECTOMY  2001  . COLONOSCOPY    . ELBOW SURGERY Right 2016  . ENDOMETRIAL ABLATION  2003  . HIP FRACTURE SURGERY Right 2009   fell playing with dog  . labrum repair Right 2011  . NASAL SINUS SURGERY     1998  . OOPHORECTOMY Right 2001   due to large ovarian cyst  . ovarian cysts     removed x 4 before the oophorectomy  . POLYPECTOMY    . THYROIDECTOMY  1997    Family History  Problem Relation Age of Onset  . Hyperlipidemia Father   . Hypertension Father   . Heart failure Father 42  . Diabetes Sister   . Arthritis Paternal Grandmother   .  Kidney disease Paternal Grandmother   . Diabetes Paternal Grandmother   . Arthritis Paternal Grandfather   . Colon cancer Neg Hx   . Colon polyps Neg Hx   . Esophageal cancer Neg Hx   . Rectal cancer Neg Hx   . Stomach cancer Neg Hx     Allergies  Allergen Reactions  . Tramadol Itching    Current Outpatient Medications on File Prior to Visit  Medication Sig Dispense Refill  . acetaminophen (TYLENOL) 500 MG tablet Take 500 mg by mouth every 6 (six) hours as needed.    Marland Kitchen amitriptyline (ELAVIL) 25 MG tablet Take 1 tablet (25 mg total) by mouth at bedtime. 30 tablet 2  . atorvastatin (LIPITOR) 20 MG tablet Take 1 tablet (20 mg total) by mouth daily. 90 tablet 1  . Calcium Carbonate-Vitamin D (CALTRATE 600+D) 600-400 MG-UNIT tablet Take 1 tablet by mouth 2 (two) times daily.    Marland Kitchen escitalopram (LEXAPRO) 10 MG tablet 1/2 tab once daily for 1 week, then increase to a full tab once daily 30 tablet 0  . glucose blood (ONETOUCH VERIO) test strip Use as instructed to check blood sugar one to two times a day.   DX  E11.9 200 each 1  . levothyroxine (SYNTHROID, LEVOTHROID) 125 MCG tablet TAKE 1 TABLET DAILY BEFORE BREAKFAST 90 tablet 0  . meclizine (ANTIVERT) 25 MG tablet Take 1 tablet (25 mg total) by mouth 3 (three) times daily as needed for dizziness. 30 tablet 0  . omeprazole (PRILOSEC) 10 MG capsule Take 10 mg by mouth daily.    Glory Rosebush DELICA LANCETS 99I MISC Use as instructed to check blood sugar one to two times a day.  DX  E11.9 200 each 1  . sitaGLIPtin (JANUVIA) 100 MG tablet Take 1 tablet (100 mg total) by mouth daily. 30 tablet 5   Current Facility-Administered Medications on File Prior to Visit  Medication Dose Route Frequency Provider Last Rate Last Dose  . 0.9 %  sodium chloride infusion  500 mL Intravenous Continuous Pyrtle, Lajuan Lines, MD        BP 116/80 (BP Location: Right Arm, Cuff Size: Normal)   Pulse 84   Temp 98.2 F (36.8 C) (Oral)   Resp 16   Ht 5\' 2"  (1.575 m)   Wt 138 lb 9.6 oz (62.9 kg)   SpO2 98%   BMI 25.35 kg/m    Objective:   Physical Exam  Constitutional: She is oriented to person, place, and time. She appears well-developed and well-nourished.  Cardiovascular: Normal rate, regular rhythm and normal heart sounds.  No murmur heard. Pulmonary/Chest: Effort normal and breath sounds normal. No respiratory distress. She has no wheezes.  Musculoskeletal: She exhibits no edema.  Neurological: She is alert and oriented to person, place, and time.  Psychiatric: She has a normal mood and affect. Her behavior is normal. Judgment and thought content normal.          Assessment & Plan:  Anxiety- much improved, tolerating lexapro. Continue current dose.

## 2017-03-29 NOTE — Patient Instructions (Signed)
Please continue lexapro.

## 2017-04-11 MED FILL — AMITRIPTYLINE HCL 25 MG TAB: 25 | 30 days supply | Qty: 30 | Fill #1

## 2017-04-19 ENCOUNTER — Ambulatory Visit: Payer: BLUE CROSS/BLUE SHIELD | Admitting: Diagnostic Neuroimaging

## 2017-05-06 ENCOUNTER — Other Ambulatory Visit: Payer: Self-pay | Admitting: Family

## 2017-05-19 ENCOUNTER — Encounter: Payer: Self-pay | Admitting: Family

## 2017-05-20 MED ORDER — OMEPRAZOLE 40 MG PO CPDR: 40.0000 mg | DELAYED_RELEASE_CAPSULE | Freq: Every day | ORAL | 3 refills | Status: DC

## 2017-06-06 ENCOUNTER — Other Ambulatory Visit: Payer: Self-pay | Admitting: Family

## 2017-06-11 ENCOUNTER — Encounter: Payer: Self-pay | Admitting: Family

## 2017-06-12 MED ORDER — AMITRIPTYLINE HCL 25 MG PO TABS: 25.0000 mg | ORAL_TABLET | Freq: Every day | ORAL | 2 refills | Status: DC

## 2017-06-12 NOTE — Telephone Encounter (Signed)
Patient is calling to follow up on this. She states she is completely out and would like this done today if possible.

## 2017-06-13 NOTE — Telephone Encounter (Signed)
See mychart message which pt has now reviewed.

## 2017-06-28 ENCOUNTER — Encounter: Payer: Self-pay | Admitting: Family

## 2017-06-28 ENCOUNTER — Ambulatory Visit: Payer: BLUE CROSS/BLUE SHIELD | Admitting: Family

## 2017-06-28 VITALS — BP 108/72 | HR 77 | Temp 97.8°F | Resp 18 | Wt 136.2 lb

## 2017-06-28 DIAGNOSIS — F419 Anxiety disorder, unspecified: Secondary | ICD-10-CM | POA: Diagnosis not present

## 2017-06-28 DIAGNOSIS — G4700 Insomnia, unspecified: Secondary | ICD-10-CM

## 2017-06-28 DIAGNOSIS — E785 Hyperlipidemia, unspecified: Secondary | ICD-10-CM

## 2017-06-28 DIAGNOSIS — F329 Major depressive disorder, single episode, unspecified: Secondary | ICD-10-CM

## 2017-06-28 DIAGNOSIS — E039 Hypothyroidism, unspecified: Secondary | ICD-10-CM

## 2017-06-28 DIAGNOSIS — E1165 Type 2 diabetes mellitus with hyperglycemia: Secondary | ICD-10-CM | POA: Diagnosis not present

## 2017-06-28 DIAGNOSIS — F32A Depression, unspecified: Secondary | ICD-10-CM

## 2017-06-28 LAB — BASIC METABOLIC PANEL
BUN: 10 mg/dL (ref 6–23)
CHLORIDE: 101 meq/L (ref 96–112)
CO2: 29 meq/L (ref 19–32)
Calcium: 9.1 mg/dL (ref 8.4–10.5)
Creatinine, Ser: 0.78 mg/dL (ref 0.40–1.20)
GFR: 81.26 mL/min (ref >60.00)
Glucose, Bld: 308 mg/dL — ABNORMAL HIGH (ref 70–99)
POTASSIUM: 4.9 meq/L (ref 3.5–5.1)
Sodium: 138 mEq/L (ref 135–145)

## 2017-06-28 LAB — MICROALBUMIN / CREATININE URINE RATIO
Creatinine,U: 35.3 mg/dL
Microalb Creat Ratio: 2.6 mg/g (ref 0.0–30.0)
Microalb, Ur: 0.9 mg/dL (ref 0.0–1.9)

## 2017-06-28 LAB — LIPID PANEL
Cholesterol: 154 mg/dL (ref 0–200)
HDL: 35.1 mg/dL — ABNORMAL LOW (ref >39.00)
NonHDL: 119.25
TRIGLYCERIDES: 304 mg/dL — AB (ref 0.0–149.0)
Total CHOL/HDL Ratio: 4
VLDL: 60.8 mg/dL — AB (ref 0.0–40.0)

## 2017-06-28 LAB — TSH: TSH: 0.02 u[IU]/mL — ABNORMAL LOW (ref 0.35–4.50)

## 2017-06-28 LAB — HEMOGLOBIN A1C: HEMOGLOBIN A1C: 8.7 % — AB (ref 4.6–6.5)

## 2017-06-28 LAB — LDL CHOLESTEROL, DIRECT: Direct LDL: 81 mg/dL

## 2017-06-28 MED ORDER — SITAGLIPTIN PHOSPHATE 100 MG PO TABS: 100.0000 mg | ORAL_TABLET | Freq: Every day | ORAL | 5 refills | Status: DC

## 2017-06-28 MED ORDER — AMITRIPTYLINE HCL 25 MG PO TABS: 25.0000 mg | ORAL_TABLET | Freq: Every day | ORAL | 2 refills | Status: DC

## 2017-06-28 NOTE — Progress Notes (Signed)
Subjective:    Patient ID: Donna Anderson, female    DOB: Aug 05, 1960, 57 y.o.   MRN: [id number]  HPI  Patient is a 57 yr old female who presents today for follow up.  Anxiety/depression-  Maintained on lexapro. Reports feeling much better.  Has been praying as well and feels that this has helped her as well. Her son moved out. This has relieved a burden for her.   Insomnia- continues elavil 25 mg.   GERD- conitnues prilosec 40mg . Some symptoms after certain foods such as chocolate  Hypothyroid- on synthroid.   Lab Results  Component Value Date   TSH 0.36 03/05/2017   DM2- reports that sugar yesterday was 282.   Lab Results  Component Value Date   HGBA1C 7.5 (H) 09/21/2016   HGBA1C 8.1 (H) 06/21/2016   Lab Results  Component Value Date   CREATININE 0.85 09/21/2016   Hyperlipidemia- on lipitor 20mg .  Lab Results  Component Value Date   CHOL 227 (H) 09/21/2016   HDL 39.40 09/21/2016   LDLDIRECT 120.0 09/21/2016   TRIG 331.0 (H) 09/21/2016   CHOLHDL 6 09/21/2016      Review of Systems See HPI  Past Medical History:  Diagnosis Date  . Allergy   . Anemia   . Anxiety   . Arthritis   . Depression   . Diabetes type 2, controlled ([place]) 06/21/2016  . Diverticulosis 2015   CT SCAN   . GERD (gastroesophageal reflux disease)   . Graves disease 1997  . Hearing loss   . History of chicken pox   . History of migraine   . Hyperlipidemia   . Hypertriglyceridemia 06/21/2016  . Kidney stones   . Osteopenia 07/08/2016  . Osteoporosis    osteopenia     Social History   Socioeconomic History  . Marital status: Married    Spouse name: Not on file  . Number of children: Not on file  . Years of education: Not on file  . Highest education level: Not on file  Occupational History  . Not on file  Social Needs  . Financial resource strain: Not on file  . Food insecurity:    Worry: Not on file    Inability: Not on file  . Transportation needs:    Medical: Not on file      Non-medical: Not on file  Tobacco Use  . Smoking status: Former Smoker    Years: 15.00    Last attempt to quit: 01/16/1999    Years since quitting: 18.4  . Smokeless tobacco: Never Used  Substance and Sexual Activity  . Alcohol use: Yes    Alcohol/week: 0.0 oz    Comment: occasional  . Drug use: No  . Sexual activity: Yes    Birth control/protection: None  Lifestyle  . Physical activity:    Days per week: Not on file    Minutes per session: Not on file  . Stress: Not on file  Relationships  . Social connections:    Talks on phone: Not on file    Gets together: Not on file    Attends religious service: Not on file    Active member of club or organization: Not on file    Attends meetings of clubs or organizations: Not on file    Relationship status: Not on file  . Intimate partner violence:    Fear of current or ex partner: Not on file    Emotionally abused: Not on file  Physically abused: Not on file    Forced sexual activity: Not on file  Other Topics Concern  . 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    Completed 12th grade   1 dog, 1 cat   Was raised by her grandmother.     Past Surgical History:  Procedure Laterality Date  . APPENDECTOMY  2001  . COLONOSCOPY    . ELBOW SURGERY Right 2016  . ENDOMETRIAL ABLATION  2003  . HIP FRACTURE SURGERY Right 2009   fell playing with dog  . labrum repair Right 2011  . NASAL SINUS SURGERY     1998  . OOPHORECTOMY Right 2001   due to large ovarian cyst  . ovarian cysts     removed x 4 before the oophorectomy  . POLYPECTOMY    . THYROIDECTOMY  1997    Family History  Problem Relation Age of Onset  . Hyperlipidemia Father   . Hypertension Father   . Heart failure Father 49  . Diabetes Sister   . Arthritis Paternal Grandmother   . Kidney disease  Paternal Grandmother   . Diabetes Paternal Grandmother   . Arthritis Paternal Grandfather   . Colon cancer Neg Hx   . Colon polyps Neg Hx   . Esophageal cancer Neg Hx   . Rectal cancer Neg Hx   . Stomach cancer Neg Hx     Allergies  Allergen Reactions  . Tramadol Itching    Current Outpatient Medications on File Prior to Visit  Medication Sig Dispense Refill  . acetaminophen (TYLENOL) 500 MG tablet Take 500 mg by mouth every 6 (six) hours as needed.    Marland Kitchen atorvastatin (LIPITOR) 20 MG tablet TAKE 1 TABLET DAILY 90 tablet 1  . Calcium Carbonate-Vitamin D (CALTRATE 600+D) 600-400 MG-UNIT tablet Take 1 tablet by mouth 2 (two) times daily.    Marland Kitchen escitalopram (LEXAPRO) 10 MG tablet Take 1 tablet (10 mg total) by mouth daily. 30 tablet 0  . glucose blood (ONETOUCH VERIO) test strip Use as instructed to check blood sugar one to two times a day.  DX  E11.9 200 each 1  . levothyroxine (SYNTHROID, LEVOTHROID) 125 MCG tablet TAKE 1 TABLET DAILY BEFORE BREAKFAST 90 tablet 1  . meclizine (ANTIVERT) 25 MG tablet Take 1 tablet (25 mg total) by mouth 3 (three) times daily as needed for dizziness. 30 tablet 0  . omeprazole (PRILOSEC) 40 MG capsule Take 1 capsule (40 mg total) by mouth daily. 30 capsule 3  . ONETOUCH DELICA LANCETS 03J MISC Use as instructed to check blood sugar one to two times a day.  DX  E11.9 200 each 1   Current Facility-Administered Medications on File Prior to Visit  Medication Dose Route Frequency Provider Last Rate Last Dose  . 0.9 %  sodium chloride infusion  500 mL Intravenous Continuous Pyrtle, Lajuan Lines, MD        BP 108/72 (BP Location: Left Arm, Patient Position: Sitting, Cuff Size: Normal)   Pulse 77   Temp 97.8 F (36.6 C) (Oral)   Resp 18   Wt 136 lb 3.2 oz (61.8 kg)   SpO2 97%   BMI 24.91 kg/m       Objective:   Physical Exam  Constitutional: She is oriented to person, place, and time. She appears  well-developed and well-nourished.  Cardiovascular: Normal  rate, regular rhythm and normal heart sounds.  No murmur heard. Pulmonary/Chest: Effort normal and breath sounds normal. No respiratory distress. She has no wheezes.  Neurological: She is alert and oriented to person, place, and time.  Skin: Skin is warm and dry.  Psychiatric: She has a normal mood and affect. Her behavior is normal. Judgment and thought content normal.          Assessment & Plan:  DM2- uncontrolled. Check A1C and then will like adjust meds. Continue [place].  Depression- much improved on lexapro, continue same.  GERD- has some breakthrough symptoms that are diet related. We discussed gerd diet.  Continue PPI.  Insomnia- stable/improved on elavil, continue same.  Hyperlipidemia- uncontrolled, obtain follow up lipid panel.  Continue statin.

## 2017-06-28 NOTE — Patient Instructions (Addendum)
Please complete lab work prior to leaving. Continue to work on healthy diabetic diet.

## 2017-06-29 ENCOUNTER — Other Ambulatory Visit: Payer: Self-pay | Admitting: Family

## 2017-06-29 NOTE — Telephone Encounter (Signed)
Sugar is uncontrolled.  Add farxiga once daily.  Do we have a coupon for her? Or perhaps she can download.  Not sure what pharmacy she wants. I have pended rx.  Also, synthroid dose is too high.  Decrease to 114mcg daily repeat tsh in 6 weeks.

## 2017-07-01 ENCOUNTER — Telehealth: Payer: Self-pay

## 2017-07-01 ENCOUNTER — Encounter: Payer: Self-pay | Admitting: Family

## 2017-07-01 MED ORDER — LEVOTHYROXINE SODIUM 100 MCG PO TABS: 100.0000 ug | ORAL_TABLET | Freq: Every day | ORAL | 5 refills | Status: DC

## 2017-07-01 MED ORDER — DAPAGLIFLOZIN PROPANEDIOL 5 MG PO TABS: 5.0000 mg | ORAL_TABLET | Freq: Every day | ORAL | 5 refills | Status: DC

## 2017-07-01 NOTE — Telephone Encounter (Signed)
Author called pt. To verify pick-up of farxiga, as pt. Reported giving author incorrect email. Pt. Stated she has another coupon she is going to use, and will pick up the rx today. No need for additional follow-up at this time.

## 2017-07-01 NOTE — Telephone Encounter (Signed)
Labs and new medication orders reviewed with pt. Wilder Glade coupon found online and e-mailed to pt's husband per pt. Request to [email or web address]. Pt. Stated her husband would pick up the prescriptions today.

## 2017-07-04 ENCOUNTER — Telehealth: Payer: Self-pay

## 2017-07-04 ENCOUNTER — Encounter: Payer: Self-pay | Admitting: Family

## 2017-07-04 MED ORDER — ESCITALOPRAM OXALATE 10 MG PO TABS: 10.0000 mg | ORAL_TABLET | Freq: Every day | ORAL | 0 refills | Status: DC

## 2017-07-04 NOTE — Telephone Encounter (Signed)
PA initiated via Covermymeds; KEY: [id number]. Awaiting determination.

## 2017-07-08 MED ORDER — EMPAGLIFLOZIN 10 MG PO TABS: 10.0000 mg | ORAL_TABLET | Freq: Every day | ORAL | 5 refills | Status: DC

## 2017-07-08 NOTE — Telephone Encounter (Signed)
PA denied. Preferred alternatives: Elsie Stain or Synjardy XR.

## 2017-07-08 NOTE — Telephone Encounter (Signed)
Please let pt know that farxiga was not approved. I sent rx for jardiance instead. She should be able to download a savings card at [email or web address].

## 2017-07-08 NOTE — Telephone Encounter (Signed)
Notified pt and she voices understanding. 

## 2017-07-09 ENCOUNTER — Encounter: Payer: Self-pay | Admitting: Family

## 2017-07-28 ENCOUNTER — Encounter: Payer: Self-pay | Admitting: Family

## 2017-07-28 ENCOUNTER — Other Ambulatory Visit: Payer: Self-pay | Admitting: Family

## 2017-07-29 ENCOUNTER — Other Ambulatory Visit: Payer: Self-pay | Admitting: Family

## 2017-07-29 ENCOUNTER — Encounter: Payer: Self-pay | Admitting: Family

## 2017-07-30 MED ORDER — ESCITALOPRAM OXALATE 10 MG PO TABS: 10.0000 mg | ORAL_TABLET | Freq: Every day | ORAL | 1 refills | Status: DC

## 2017-07-30 MED ORDER — ESCITALOPRAM OXALATE 10 MG PO TABS: 10.0000 mg | ORAL_TABLET | Freq: Every day | ORAL | 0 refills | Status: DC

## 2017-07-30 NOTE — Addendum Note (Signed)
Addended by: Kelle Darting A on: 07/30/2017 09:49 AM   Modules accepted: Orders

## 2017-07-30 NOTE — Telephone Encounter (Signed)
See 07/29/17 pt email.

## 2017-07-31 ENCOUNTER — Encounter: Payer: Self-pay | Admitting: Family

## 2017-07-31 MED ORDER — ESCITALOPRAM OXALATE 10 MG PO TABS: 10.0000 mg | ORAL_TABLET | Freq: Every day | ORAL | 1 refills | Status: DC

## 2017-07-31 MED ORDER — OMEPRAZOLE 40 MG PO CPDR: 40.0000 mg | DELAYED_RELEASE_CAPSULE | Freq: Every day | ORAL | 1 refills | Status: DC

## 2017-07-31 MED ORDER — LEVOTHYROXINE SODIUM 100 MCG PO TABS: 100.0000 ug | ORAL_TABLET | Freq: Every day | ORAL | 1 refills | Status: DC

## 2017-07-31 MED ORDER — SITAGLIPTIN PHOSPHATE 100 MG PO TABS: 100.0000 mg | ORAL_TABLET | Freq: Every day | ORAL | 1 refills | Status: DC

## 2017-08-01 MED ORDER — SITAGLIPTIN PHOSPHATE 100 MG PO TABS: 100.0000 mg | ORAL_TABLET | Freq: Every day | ORAL | 5 refills | Status: DC

## 2017-08-01 NOTE — Addendum Note (Signed)
Addended by: Kelle Darting A on: 08/01/2017 12:07 PM   Modules accepted: Orders

## 2017-08-01 NOTE — Telephone Encounter (Signed)
Spoke with pt. She wants jardiance and [place] to go to local pharmacy due to cost. Vania Rea still has refills and was not sent to mail order. Januvia went to mail order yesterday. Called [facility] and spoke with Otila Kluver to cancel Januvia. They cannot cancel at this time as pt will need to set up her account first. Once acct is active, they will let her know what Rxs they received and she can tell them at that time that she doesn't want the [place]. Januvia rx sent to [facility]. Email sent to pt to set up acct.

## 2017-09-04 ENCOUNTER — Encounter: Payer: Self-pay | Admitting: Family

## 2017-09-04 NOTE — Telephone Encounter (Signed)
Dr Nani Ravens -- please advise in PCP's absence?

## 2017-09-06 NOTE — Telephone Encounter (Signed)
Shelda Pal, DO  to Me      09/04/17 4:40 PM  Could do an oral medicine but would need appt. TY.

## 2017-09-15 ENCOUNTER — Encounter: Payer: Self-pay | Admitting: Family

## 2017-09-17 MED ORDER — GLUCOSE BLOOD VI STRP: ORAL_STRIP | 1 refills | Status: DC

## 2017-09-19 ENCOUNTER — Encounter: Payer: Self-pay | Admitting: Family

## 2017-09-20 ENCOUNTER — Encounter: Payer: Self-pay | Admitting: Family

## 2017-09-20 MED ORDER — MECLIZINE HCL 25 MG PO TABS: 25.0000 mg | ORAL_TABLET | Freq: Three times a day (TID) | ORAL | 0 refills | Status: DC | PRN

## 2017-09-20 NOTE — Telephone Encounter (Signed)
See previous pt email.

## 2017-09-20 NOTE — Addendum Note (Signed)
Addended by: Debbrah Alar on: 09/20/2017 04:32 PM   Modules accepted: Orders

## 2017-10-06 ENCOUNTER — Encounter: Payer: Self-pay | Admitting: Family

## 2017-10-07 MED ORDER — FLUCONAZOLE 150 MG PO TABS: ORAL_TABLET | ORAL | 0 refills | Status: DC

## 2017-10-21 DIAGNOSIS — Z23 Encounter for immunization: Secondary | ICD-10-CM | POA: Diagnosis not present

## 2017-12-06 LAB — HM DIABETES EYE EXAM

## 2017-12-19 ENCOUNTER — Other Ambulatory Visit: Payer: Self-pay | Admitting: Family

## 2017-12-19 MED ORDER — ATORVASTATIN CALCIUM 20 MG PO TABS: 20.0000 mg | ORAL_TABLET | Freq: Every day | ORAL | 1 refills | Status: DC

## 2017-12-19 NOTE — Telephone Encounter (Signed)
Refills sent

## 2017-12-19 NOTE — Telephone Encounter (Signed)
Copied from [place] [id number]. Topic: Quick Communication - Rx Refill/Question >> Dec 19, 2017 12:59 PM Blase Mess A wrote: Medication: atorvastatin (LIPITOR) 20 MG tablet [[id number]] 43 day Supply  Has the patient contacted their pharmacy? Yes  (Agent: If no, request that the patient contact the pharmacy for the refill.) (Agent: If yes, when and what did the pharmacy advise?)  Preferred Pharmacy (with phone number or street name):[facility], [place] [address] [place] [address] Phone: [phone] Fax: [phone]    Agent: Please be advised that RX refills may take up to 3 business days. We ask that you follow-up with your pharmacy.

## 2017-12-20 ENCOUNTER — Encounter: Payer: Self-pay | Admitting: Family

## 2017-12-20 ENCOUNTER — Ambulatory Visit: Payer: BLUE CROSS/BLUE SHIELD | Admitting: Family

## 2017-12-20 VITALS — BP 120/80 | HR 75 | Temp 98.0°F | Resp 16 | Ht 62.0 in | Wt 135.6 lb

## 2017-12-20 DIAGNOSIS — M7702 Medial epicondylitis, left elbow: Secondary | ICD-10-CM | POA: Diagnosis not present

## 2017-12-20 DIAGNOSIS — J069 Acute upper respiratory infection, unspecified: Secondary | ICD-10-CM | POA: Diagnosis not present

## 2017-12-20 DIAGNOSIS — B9789 Other viral agents as the cause of diseases classified elsewhere: Secondary | ICD-10-CM | POA: Diagnosis not present

## 2017-12-20 MED ORDER — MELOXICAM 7.5 MG PO TABS: 7.5000 mg | ORAL_TABLET | Freq: Every day | ORAL | 0 refills | Status: DC

## 2017-12-20 MED ORDER — ATORVASTATIN CALCIUM 20 MG PO TABS: 20.0000 mg | ORAL_TABLET | Freq: Every day | ORAL | 0 refills | Status: DC

## 2017-12-20 MED FILL — ATORVASTATIN CALCIUM 20 MG: 20 | 14 days supply | Qty: 14 | Fill #0

## 2017-12-20 MED FILL — MELOXICAM 7.5 MG TABLET: 7.5 | 14 days supply | Qty: 14 | Fill #0

## 2017-12-20 NOTE — Patient Instructions (Signed)
Please begin meloxicam once daily for your left elbow pain. Call if pain worsens or if it is not improved in 1 month and we will plan referral to Dr. Barbaraann Barthel.  Let me know if cough does not continue to improve.

## 2017-12-20 NOTE — Progress Notes (Signed)
Subjective:    Patient ID: Donna Anderson, female    DOB: [birth year], 57 y.o.   MRN: [id number]  HPI  Patient is a 57 yr old female with left elbow pain. Reports that pain has been present for about 2.5 months. Notes that it is painful to even touch her elbow.  Hurts to bend, pain interferes with sleep. Especially if she keeps her elbow bent.  Cough- reports that "I think it is just a cold". Started about 9 days ago. Hurts some to breath, "but I can feel it starting to loosen up." Does not think that she has a fever. Husband has similar symptoms.  She is using mucinex complete, dayquil/nyquil. Some improvement with these measures.   Review of Systems    see HPI  Past Medical History:  Diagnosis Date  . Allergy   . Anemia   . Anxiety   . Arthritis   . Depression   . Diabetes type 2, controlled ([place]) 06/21/2016  . Diverticulosis 2015   CT SCAN   . GERD (gastroesophageal reflux disease)   . Graves disease 1997  . Hearing loss   . History of chicken pox   . History of migraine   . Hyperlipidemia   . Hypertriglyceridemia 06/21/2016  . Kidney stones   . Osteopenia 07/08/2016  . Osteoporosis    osteopenia     Social History   Socioeconomic History  . Marital status: Married    Spouse name: Not on file  . Number of children: Not on file  . Years of education: Not on file  . Highest education level: Not on file  Occupational History  . Not on file  Social Needs  . Financial resource strain: Not on file  . Food insecurity:    Worry: Not on file    Inability: Not on file  . Transportation needs:    Medical: Not on file    Non-medical: Not on file  Tobacco Use  . Smoking status: Former Smoker    Years: 15.00    Last attempt to quit: 01/16/1999    Years since quitting: 18.9  . Smokeless tobacco: Never Used  Substance and Sexual Activity  . Alcohol use: Yes    Alcohol/week: 0.0 standard drinks    Comment: occasional  . Drug use: No  . Sexual activity: Yes    Birth  control/protection: None  Lifestyle  . Physical activity:    Days per week: Not on file    Minutes per session: Not on file  . Stress: Not on file  Relationships  . Social connections:    Talks on phone: Not on file    Gets together: Not on file    Attends religious service: Not on file    Active member of club or organization: Not on file    Attends meetings of clubs or organizations: Not on file    Relationship status: Not on file  . Intimate partner violence:    Fear of current or ex partner: Not on file    Emotionally abused: Not on file    Physically abused: Not on file    Forced sexual activity: Not on file  Other Topics Concern  . 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    Completed 12th grade   1 dog, 1  cat   Was raised by her grandmother.     Past Surgical History:  Procedure Laterality Date  . APPENDECTOMY  2001  . COLONOSCOPY    . ELBOW SURGERY Right 2016  . ENDOMETRIAL ABLATION  2003  . HIP FRACTURE SURGERY Right 2009   fell playing with dog  . labrum repair Right 2011  . NASAL SINUS SURGERY     1998  . OOPHORECTOMY Right 2001   due to large ovarian cyst  . ovarian cysts     removed x 4 before the oophorectomy  . POLYPECTOMY    . THYROIDECTOMY  1997    Family History  Problem Relation Age of Onset  . Hyperlipidemia Father   . Hypertension Father   . Heart failure Father 24  . Diabetes Sister   . Arthritis Paternal Grandmother   . Kidney disease Paternal Grandmother   . Diabetes Paternal Grandmother   . Arthritis Paternal Grandfather   . Colon cancer Neg Hx   . Colon polyps Neg Hx   . Esophageal cancer Neg Hx   . Rectal cancer Neg Hx   . Stomach cancer Neg Hx     Allergies  Allergen Reactions  . Tramadol Itching    Current Outpatient Medications on File Prior to Visit    Medication Sig Dispense Refill  . amitriptyline (ELAVIL) 25 MG tablet Take 1 tablet (25 mg total) by mouth at bedtime. 30 tablet 2  . empagliflozin (JARDIANCE) 10 MG TABS tablet Take 10 mg by mouth daily. 30 tablet 5  . escitalopram (LEXAPRO) 10 MG tablet Take 1 tablet (10 mg total) by mouth daily. 90 tablet 1  . fluconazole (DIFLUCAN) 150 MG tablet Take 1 tab by mouth today, may repeat in 3 days as needed. 2 tablet 0  . glucose blood (ONETOUCH VERIO) test strip Use as instructed to check blood sugar one to two times a day.  DX  E11.9 200 each 1  . levothyroxine (SYNTHROID, LEVOTHROID) 100 MCG tablet Take 1 tablet (100 mcg total) by mouth daily. 90 tablet 1  . meclizine (ANTIVERT) 25 MG tablet Take 1 tablet (25 mg total) by mouth 3 (three) times daily as needed for dizziness. 30 tablet 0  . omeprazole (PRILOSEC) 40 MG capsule Take 1 capsule (40 mg total) by mouth daily. 90 capsule 1  . ONETOUCH DELICA LANCETS 81E MISC Use as instructed to check blood sugar one to two times a day.  DX  E11.9 200 each 1  . sitaGLIPtin (JANUVIA) 100 MG tablet Take 1 tablet (100 mg total) by mouth daily. 30 tablet 5   Current Facility-Administered Medications on File Prior to Visit  Medication Dose Route Frequency Provider Last Rate Last Dose  . 0.9 %  sodium chloride infusion  500 mL Intravenous Continuous Pyrtle, Lajuan Lines, MD        BP 120/80 (BP Location: Right Arm, Cuff Size: Normal)   Pulse 75   Temp 98 F (36.7 C) (Oral)   Resp 16   Ht 5\' 2"  (1.575 m)   Wt 135 lb 9.6 oz (61.5 kg)   SpO2 99%   BMI 24.80 kg/m    Objective:   Physical Exam  Constitutional: She is oriented to person, place, and time. She appears well-developed and well-nourished.  HENT:  Head: Normocephalic and atraumatic.  Right Ear: Tympanic membrane and ear canal normal.  Left Ear: Tympanic membrane and ear canal normal.  Mouth/Throat: No oropharyngeal exudate, posterior oropharyngeal edema or posterior oropharyngeal erythema.  Neck: Neck supple. No thyromegaly present.  Cardiovascular: Normal rate, regular rhythm and normal heart sounds.  No murmur heard. Pulmonary/Chest: Effort normal and breath sounds normal. No respiratory distress. She has no wheezes.  Musculoskeletal:  + tenderness overlying medial left epicondyl   Neurological: She is alert and oriented to person, place, and time.  Skin: Skin is warm and dry.  Psychiatric: She has a normal mood and affect. Her behavior is normal. Judgment and thought content normal.          Assessment & Plan:  Left medial epicondylitis- rx with meloxicam, icing, call if symptoms worsen or if not improved in 2 weeks. Plan referral to sports med at that time.  Viral URI with cough- lung exam reassuring, symptoms improving. Pt is advised to continue supportive measures and call if symptoms worsen or if they do not continue to improve.

## 2017-12-30 ENCOUNTER — Encounter: Payer: Self-pay | Admitting: Family

## 2017-12-30 DIAGNOSIS — M7702 Medial epicondylitis, left elbow: Secondary | ICD-10-CM

## 2017-12-31 ENCOUNTER — Encounter: Payer: Self-pay | Admitting: Family

## 2018-01-01 ENCOUNTER — Encounter: Payer: Self-pay | Admitting: Family

## 2018-01-01 MED ORDER — ONETOUCH DELICA LANCETS 33G MISC: 1 refills | Status: DC

## 2018-01-03 ENCOUNTER — Ambulatory Visit: Payer: BLUE CROSS/BLUE SHIELD | Admitting: Family Medicine

## 2018-01-03 ENCOUNTER — Encounter

## 2018-01-03 ENCOUNTER — Encounter: Payer: Self-pay | Admitting: Family Medicine

## 2018-01-03 VITALS — BP 134/87 | HR 87 | Ht 63.0 in | Wt 135.0 lb

## 2018-01-03 DIAGNOSIS — M25522 Pain in left elbow: Secondary | ICD-10-CM

## 2018-01-03 NOTE — Patient Instructions (Signed)
You have medial epicondylitis (golfer's elbow) Avoid painful activities as much as possible (unless doing home exercises). Aleve 2 tabs twice a day with food for pain and inflammation. Strengthening with 1 pound weight pronation/supination, wrist flexion, stretching exercises (hold stretches 20-30 seconds and repeat 3 times, exercises 3 sets of 10 of each). Wrist brace and counterforce brace are helpful to unload area of pain while it heals. Consider formal physical therapy, nitro patches, injection if not improving. Follow up with me in 6 weeks.

## 2018-01-08 ENCOUNTER — Encounter: Payer: Self-pay | Admitting: Family Medicine

## 2018-01-08 NOTE — Progress Notes (Signed)
PCP: Debbrah Alar, NP  Subjective:   HPI: Patient is a 57 y.o. female here for left elbow pain.  Patient reports for about 3 months she's had medial left elbow pain. Pain is 0/10 but up to 10/10 and sharp at times. Worse with motion of the wrist. No numbness, skin changes, swelling. Can feel a pop in the elbow.  Past Medical History:  Diagnosis Date  . Allergy   . Anemia   . Anxiety   . Arthritis   . Depression   . Diabetes type 2, controlled ([place]) 06/21/2016  . Diverticulosis 2015   CT SCAN   . GERD (gastroesophageal reflux disease)   . Graves disease 1997  . Hearing loss   . History of chicken pox   . History of migraine   . Hyperlipidemia   . Hypertriglyceridemia 06/21/2016  . Kidney stones   . Osteopenia 07/08/2016  . Osteoporosis    osteopenia    Current Outpatient Medications on File Prior to Visit  Medication Sig Dispense Refill  . amitriptyline (ELAVIL) 25 MG tablet Take 1 tablet (25 mg total) by mouth at bedtime. 30 tablet 2  . atorvastatin (LIPITOR) 20 MG tablet Take 1 tablet (20 mg total) by mouth daily. 14 tablet 0  . empagliflozin (JARDIANCE) 10 MG TABS tablet Take 10 mg by mouth daily. 30 tablet 5  . escitalopram (LEXAPRO) 10 MG tablet Take 1 tablet (10 mg total) by mouth daily. 90 tablet 1  . fluconazole (DIFLUCAN) 150 MG tablet Take 1 tab by mouth today, may repeat in 3 days as needed. 2 tablet 0  . glucose blood (ONETOUCH VERIO) test strip Use as instructed to check blood sugar one to two times a day.  DX  E11.9 200 each 1  . levothyroxine (SYNTHROID, LEVOTHROID) 100 MCG tablet Take 1 tablet (100 mcg total) by mouth daily. 90 tablet 1  . meclizine (ANTIVERT) 25 MG tablet Take 1 tablet (25 mg total) by mouth 3 (three) times daily as needed for dizziness. 30 tablet 0  . meloxicam (MOBIC) 7.5 MG tablet Take 1 tablet (7.5 mg total) by mouth daily. 14 tablet 0  . omeprazole (PRILOSEC) 40 MG capsule Take 1 capsule (40 mg total) by mouth daily. 90 capsule 1   . ONETOUCH DELICA LANCETS 25E MISC Use as instructed to check blood sugar one to two times a day.  DX  E11.9 200 each 1  . sitaGLIPtin (JANUVIA) 100 MG tablet Take 1 tablet (100 mg total) by mouth daily. 30 tablet 5   Current Facility-Administered Medications on File Prior to Visit  Medication Dose Route Frequency Provider Last Rate Last Dose  . 0.9 %  sodium chloride infusion  500 mL Intravenous Continuous Pyrtle, Lajuan Lines, MD        Past Surgical History:  Procedure Laterality Date  . APPENDECTOMY  2001  . COLONOSCOPY    . ELBOW SURGERY Right 2016  . ENDOMETRIAL ABLATION  2003  . HIP FRACTURE SURGERY Right 2009   fell playing with dog  . labrum repair Right 2011  . NASAL SINUS SURGERY     1998  . OOPHORECTOMY Right 2001   due to large ovarian cyst  . ovarian cysts     removed x 4 before the oophorectomy  . POLYPECTOMY    . THYROIDECTOMY  1997    Allergies  Allergen Reactions  . Tramadol Itching    Social History   Socioeconomic History  . Marital status: Married    Spouse  name: Not on file  . Number of children: Not on file  . Years of education: Not on file  . Highest education level: Not on file  Occupational History  . Not on file  Social Needs  . Financial resource strain: Not on file  . Food insecurity:    Worry: Not on file    Inability: Not on file  . Transportation needs:    Medical: Not on file    Non-medical: Not on file  Tobacco Use  . Smoking status: Former Smoker    Years: 15.00    Last attempt to quit: 01/16/1999    Years since quitting: 18.9  . Smokeless tobacco: Never Used  Substance and Sexual Activity  . Alcohol use: Yes    Alcohol/week: 0.0 standard drinks    Comment: occasional  . Drug use: No  . Sexual activity: Yes    Birth control/protection: None  Lifestyle  . Physical activity:    Days per week: Not on file    Minutes per session: Not on file  . Stress: Not on file  Relationships  . Social connections:    Talks on phone:  Not on file    Gets together: Not on file    Attends religious service: Not on file    Active member of club or organization: Not on file    Attends meetings of clubs or organizations: Not on file    Relationship status: Not on file  . Intimate partner violence:    Fear of current or ex partner: Not on file    Emotionally abused: Not on file    Physically abused: Not on file    Forced sexual activity: Not on file  Other Topics Concern  . 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    Completed 12th grade   1 dog, 1 cat   Was raised by her grandmother.     Family History  Problem Relation Age of Onset  . Hyperlipidemia Father   . Hypertension Father   . Heart failure Father 20  . Diabetes Sister   . Arthritis Paternal Grandmother   . Kidney disease Paternal Grandmother   . Diabetes Paternal Grandmother   . Arthritis Paternal Grandfather   . Colon cancer Neg Hx   . Colon polyps Neg Hx   . Esophageal cancer Neg Hx   . Rectal cancer Neg Hx   . Stomach cancer Neg Hx     BP 134/87   Pulse 87   Ht 5\' 3"  (1.6 m)   Wt 135 lb (61.2 kg)   BMI 23.91 kg/m   Review of Systems: See HPI above.     Objective:  Physical Exam:  Gen: NAD, comfortable in exam room  Left elbow: No deformity, swelling, bruising. FROM with 5/5 strength without pain. Tenderness to palpation medial epicondyle.  No other tenderness. NVI distally. Collateral ligaments intact.  Right elbow: No deformity. FROM with 5/5 strength. No tenderness to palpation. NVI distally.   Assessment & Plan:  1. Left elbow pain - 2/2 medial epicondylitis.  Aleve.  Shown home exercises and stretches to do daily.  Wrist brace, counterforce brace.  Consider physical therapy, nitro patches, injection if not improving.  F/u in 6 weeks.

## 2018-01-14 ENCOUNTER — Encounter: Payer: Self-pay | Admitting: Family Medicine

## 2018-01-16 ENCOUNTER — Other Ambulatory Visit: Payer: Self-pay | Admitting: Family

## 2018-01-17 ENCOUNTER — Ambulatory Visit: Payer: BLUE CROSS/BLUE SHIELD | Admitting: Family Medicine

## 2018-01-17 ENCOUNTER — Encounter: Payer: Self-pay | Admitting: Family

## 2018-02-04 ENCOUNTER — Other Ambulatory Visit: Payer: Self-pay | Admitting: Family

## 2018-02-14 ENCOUNTER — Ambulatory Visit: Payer: BLUE CROSS/BLUE SHIELD | Admitting: Family Medicine

## 2018-02-15 ENCOUNTER — Telehealth: Payer: Self-pay | Admitting: Family

## 2018-02-17 NOTE — Telephone Encounter (Signed)
Donna Anderson -- Please advise Levothyroxine request. Pt's last OV was acute and last tsh was 06/28/17.

## 2018-02-24 NOTE — Telephone Encounter (Signed)
Called pt left detailed VM to call back and make an appt.

## 2018-02-24 NOTE — Telephone Encounter (Signed)
Melissa -- does this need to be an OV with you or a lab visit?

## 2018-02-24 NOTE — Telephone Encounter (Signed)
Please call patient for follow up diabetes

## 2018-02-24 NOTE — Telephone Encounter (Signed)
Pt needs OV for follow up of thyroid and DM2.

## 2018-02-27 NOTE — Telephone Encounter (Signed)
Patient is scheduled to be seen tomorrow.

## 2018-02-28 ENCOUNTER — Other Ambulatory Visit: Payer: Self-pay

## 2018-02-28 ENCOUNTER — Encounter: Payer: Self-pay | Admitting: Family

## 2018-02-28 ENCOUNTER — Ambulatory Visit: Payer: BLUE CROSS/BLUE SHIELD | Admitting: Family Medicine

## 2018-02-28 ENCOUNTER — Ambulatory Visit: Payer: BLUE CROSS/BLUE SHIELD | Admitting: Family

## 2018-02-28 ENCOUNTER — Telehealth: Payer: Self-pay | Admitting: Family

## 2018-02-28 ENCOUNTER — Encounter: Payer: Self-pay | Admitting: Family Medicine

## 2018-02-28 VITALS — BP 128/89 | HR 69 | Temp 98.0°F | Resp 16 | Ht 63.0 in | Wt 134.2 lb

## 2018-02-28 VITALS — BP 135/94 | HR 81 | Ht 63.0 in | Wt 134.0 lb

## 2018-02-28 DIAGNOSIS — M25522 Pain in left elbow: Secondary | ICD-10-CM

## 2018-02-28 DIAGNOSIS — E875 Hyperkalemia: Secondary | ICD-10-CM

## 2018-02-28 DIAGNOSIS — F329 Major depressive disorder, single episode, unspecified: Secondary | ICD-10-CM | POA: Diagnosis not present

## 2018-02-28 DIAGNOSIS — E785 Hyperlipidemia, unspecified: Secondary | ICD-10-CM | POA: Diagnosis not present

## 2018-02-28 DIAGNOSIS — F32A Depression, unspecified: Secondary | ICD-10-CM

## 2018-02-28 DIAGNOSIS — E039 Hypothyroidism, unspecified: Secondary | ICD-10-CM

## 2018-02-28 DIAGNOSIS — E1165 Type 2 diabetes mellitus with hyperglycemia: Secondary | ICD-10-CM | POA: Diagnosis not present

## 2018-02-28 DIAGNOSIS — Z23 Encounter for immunization: Secondary | ICD-10-CM | POA: Diagnosis not present

## 2018-02-28 LAB — COMPREHENSIVE METABOLIC PANEL
ALT: 25 U/L (ref 0–35)
AST: 17 U/L (ref 0–37)
Albumin: 4.8 g/dL (ref 3.5–5.2)
Alkaline Phosphatase: 126 U/L — ABNORMAL HIGH (ref 39–117)
BILIRUBIN TOTAL: 0.5 mg/dL (ref 0.2–1.2)
BUN: 17 mg/dL (ref 6–23)
CO2: 31 mEq/L (ref 19–32)
Calcium: 9.7 mg/dL (ref 8.4–10.5)
Chloride: 102 mEq/L (ref 96–112)
Creatinine, Ser: 0.84 mg/dL (ref 0.40–1.20)
GFR: 70.01 mL/min (ref >60.00)
Glucose, Bld: 122 mg/dL — ABNORMAL HIGH (ref 70–99)
Potassium: 5.6 mEq/L — ABNORMAL HIGH (ref 3.5–5.1)
Sodium: 143 mEq/L (ref 135–145)
Total Protein: 7 g/dL (ref 6.0–8.3)

## 2018-02-28 LAB — LIPID PANEL
Cholesterol: 172 mg/dL (ref 0–200)
HDL: 41.2 mg/dL (ref >39.00)
NonHDL: 130.74
Total CHOL/HDL Ratio: 4
Triglycerides: 238 mg/dL — ABNORMAL HIGH (ref 0.0–149.0)
VLDL: 47.6 mg/dL — ABNORMAL HIGH (ref 0.0–40.0)

## 2018-02-28 LAB — HEMOGLOBIN A1C: Hgb A1c MFr Bld: 8.1 % — ABNORMAL HIGH (ref 4.6–6.5)

## 2018-02-28 LAB — LDL CHOLESTEROL, DIRECT: Direct LDL: 110 mg/dL

## 2018-02-28 LAB — TSH: TSH: 4.28 u[IU]/mL (ref 0.35–4.50)

## 2018-02-28 MED ORDER — EMPAGLIFLOZIN 10 MG PO TABS: 10.0000 mg | ORAL_TABLET | Freq: Every day | ORAL | 5 refills | Status: DC

## 2018-02-28 MED ORDER — PIOGLITAZONE HCL 30 MG PO TABS: 30.0000 mg | ORAL_TABLET | Freq: Every day | ORAL | 2 refills | Status: DC

## 2018-02-28 MED ORDER — AMITRIPTYLINE HCL 25 MG PO TABS: 25.0000 mg | ORAL_TABLET | Freq: Every day | ORAL | 1 refills | Status: DC

## 2018-02-28 MED ORDER — SITAGLIPTIN PHOSPHATE 100 MG PO TABS: 100.0000 mg | ORAL_TABLET | Freq: Every day | ORAL | 5 refills | Status: DC

## 2018-02-28 NOTE — Telephone Encounter (Signed)
Results given to patient, she will pick up actos. She denies taking any supplements. She will come in Friday the 21st for bmet as this is the only day off she has.

## 2018-02-28 NOTE — Progress Notes (Signed)
PCP: Debbrah Alar, NP  Subjective:   HPI: Patient is a 58 y.o. female here for left elbow pain.  01/03/18: Patient reports for about 3 months she's had medial left elbow pain. Pain is 0/10 but up to 10/10 and sharp at times. Worse with motion of the wrist. No numbness, skin changes, swelling. Can feel a pop in the elbow.  02/28/18: Patient reports she continues to have pain medial elbow at 2/10 level. Has been doing home exercises, wearing wrist brace at night, counterforce brace regularly. Worse with extension of elbow and by end of day. Getting a painful popping medial elbow. Occasionally gets tingling into 4th and 5th digits but not regularly and not with popping she's getting. Taking aleve for pain. No skin changes.   Past Medical History:  Diagnosis Date  . Allergy   . Anemia   . Anxiety   . Arthritis   . Depression   . Diabetes type 2, controlled ([place]) 06/21/2016  . Diverticulosis 2015   CT SCAN   . GERD (gastroesophageal reflux disease)   . Graves disease 1997  . Hearing loss   . History of chicken pox   . History of migraine   . Hyperlipidemia   . Hypertriglyceridemia 06/21/2016  . Kidney stones   . Osteopenia 07/08/2016  . Osteoporosis    osteopenia    Current Outpatient Medications on File Prior to Visit  Medication Sig Dispense Refill  . atorvastatin (LIPITOR) 20 MG tablet Take 1 tablet (20 mg total) by mouth daily. 14 tablet 0  . escitalopram (LEXAPRO) 10 MG tablet TAKE 1 TABLET DAILY 90 tablet 1  . glucose blood (ONETOUCH VERIO) test strip Use as instructed to check blood sugar one to two times a day.  DX  E11.9 200 each 1  . omeprazole (PRILOSEC) 40 MG capsule TAKE 1 CAPSULE DAILY 90 capsule 1  . ONETOUCH DELICA LANCETS 78I MISC Use as instructed to check blood sugar one to two times a day.  DX  E11.9 200 each 1  . SYNTHROID 100 MCG tablet TAKE 1 TABLET DAILY. PLEASEMAKE AN APPOINTMENT WITH   YOUR DOCTOR FOR ASSESSMENT AS SOON AS POSSIBLE. 30  tablet 0   Current Facility-Administered Medications on File Prior to Visit  Medication Dose Route Frequency Provider Last Rate Last Dose  . 0.9 %  sodium chloride infusion  500 mL Intravenous Continuous Pyrtle, Lajuan Lines, MD        Past Surgical History:  Procedure Laterality Date  . APPENDECTOMY  2001  . COLONOSCOPY    . ELBOW SURGERY Right 2016  . ENDOMETRIAL ABLATION  2003  . HIP FRACTURE SURGERY Right 2009   fell playing with dog  . labrum repair Right 2011  . NASAL SINUS SURGERY     1998  . OOPHORECTOMY Right 2001   due to large ovarian cyst  . ovarian cysts     removed x 4 before the oophorectomy  . POLYPECTOMY    . THYROIDECTOMY  1997    Allergies  Allergen Reactions  . Tramadol Itching    Social History   Socioeconomic History  . Marital status: Married    Spouse name: Not on file  . Number of children: Not on file  . Years of education: Not on file  . Highest education level: Not on file  Occupational History  . Not on file  Social Needs  . Financial resource strain: Not on file  . Food insecurity:    Worry: Not on file  Inability: Not on file  . Transportation needs:    Medical: Not on file    Non-medical: Not on file  Tobacco Use  . Smoking status: Former Smoker    Years: 15.00    Last attempt to quit: 01/16/1999    Years since quitting: 19.1  . Smokeless tobacco: Never Used  Substance and Sexual Activity  . Alcohol use: Yes    Alcohol/week: 0.0 standard drinks    Comment: occasional  . Drug use: No  . Sexual activity: Yes    Birth control/protection: None  Lifestyle  . Physical activity:    Days per week: Not on file    Minutes per session: Not on file  . Stress: Not on file  Relationships  . Social connections:    Talks on phone: Not on file    Gets together: Not on file    Attends religious service: Not on file    Active member of club or organization: Not on file    Attends meetings of clubs or organizations: Not on file     Relationship status: Not on file  . Intimate partner violence:    Fear of current or ex partner: Not on file    Emotionally abused: Not on file    Physically abused: Not on file    Forced sexual activity: Not on file  Other Topics Concern  . 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    Completed 12th grade   1 dog, 1 cat   Was raised by her grandmother.     Family History  Problem Relation Age of Onset  . Hyperlipidemia Father   . Hypertension Father   . Heart failure Father 103  . Diabetes Sister   . Arthritis Paternal Grandmother   . Kidney disease Paternal Grandmother   . Diabetes Paternal Grandmother   . Arthritis Paternal Grandfather   . Colon cancer Neg Hx   . Colon polyps Neg Hx   . Esophageal cancer Neg Hx   . Rectal cancer Neg Hx   . Stomach cancer Neg Hx     BP (!) 135/94   Pulse 81   Ht 5\' 3"  (1.6 m)   Wt 134 lb (60.8 kg)   BMI 23.74 kg/m   Review of Systems: See HPI above.     Objective:  Physical Exam:  Gen: NAD, comfortable in exam room  Left elbow: No deformity, swelling, bruising.  Small click on flexion to extension medial elbow at medial epicondyle. FROM with 5/5 strength wrist and elbow without pain. TTP medial epicondyle.  No other tenderness. NVI distally. Negative tinels cubital tunnel.  MSK u/s:  Proximal common flexor tendon intact.  Ulnar nerve on dynamic imaging does not sublux or dislocate out of cubital tunnel.   Assessment & Plan:  1. Left elbow pain - 2/2 medial epicondylitis though with a plica medially.  No evidence ulnar subluxation/dislocation on exam or dynamic ultrasound.  Stop HEP.  Continue aleve, start using elbow sleeve.  Consider nitro patches, repeat ultrasound, MRI if not improving.  F/u in 6 weeks.

## 2018-02-28 NOTE — Telephone Encounter (Signed)
Please contact pt and let her know that her sugar is above goal.  Please add actos once daily and work hard on diet. Also potassium is elevated.  Is she taking any supplements that contain potassium? If so, please stop. Repeat BMET on Monday. Dx is hyperkalemia.

## 2018-02-28 NOTE — Progress Notes (Signed)
Subjective:    Patient ID: Donna Anderson, female    DOB: July 19, 1960, 58 y.o.   MRN: [id number]  HPI  Patient is a 58 yr old female who presents today for follow up.  DM2- reports fastin 150's post prandial often >200. Admits to poor diet choices. She is maintianed on [place].  Reports eye exam at [facility] [address] Lab Results  Component Value Date   HGBA1C 8.7 (H) 06/28/2017   HGBA1C 7.5 (H) 09/21/2016   HGBA1C 8.1 (H) 06/21/2016   Lab Results  Component Value Date   MICROALBUR 0.9 06/28/2017   CREATININE 0.78 06/28/2017   Hypothyroid- maintained on synthroid 130mcg.  Lab Results  Component Value Date   TSH 0.02 (L) 06/28/2017    Hyperlipidemia- continues lipitor. Denies myalgia.   Depression- maintained on lexapro. Reports that mood is great.   Review of Systems See HPI  Past Medical History:  Diagnosis Date  . Allergy   . Anemia   . Anxiety   . Arthritis   . Depression   . Diabetes type 2, controlled ([place]) 06/21/2016  . Diverticulosis 2015   CT SCAN   . GERD (gastroesophageal reflux disease)   . Graves disease 1997  . Hearing loss   . History of chicken pox   . History of migraine   . Hyperlipidemia   . Hypertriglyceridemia 06/21/2016  . Kidney stones   . Osteopenia 07/08/2016  . Osteoporosis    osteopenia     Social History   Socioeconomic History  . Marital status: Married    Spouse name: Not on file  . Number of children: Not on file  . Years of education: Not on file  . Highest education level: Not on file  Occupational History  . Not on file  Social Needs  . Financial resource strain: Not on file  . Food insecurity:    Worry: Not on file    Inability: Not on file  . Transportation needs:    Medical: Not on file    Non-medical: Not on file  Tobacco Use  . Smoking status: Former Smoker    Years: 15.00    Last attempt to quit: 01/16/1999    Years since quitting: 19.1  . Smokeless tobacco: Never Used  Substance and  Sexual Activity  . Alcohol use: Yes    Alcohol/week: 0.0 standard drinks    Comment: occasional  . Drug use: No  . Sexual activity: Yes    Birth control/protection: None  Lifestyle  . Physical activity:    Days per week: Not on file    Minutes per session: Not on file  . Stress: Not on file  Relationships  . Social connections:    Talks on phone: Not on file    Gets together: Not on file    Attends religious service: Not on file    Active member of club or organization: Not on file    Attends meetings of clubs or organizations: Not on file    Relationship status: Not on file  . Intimate partner violence:    Fear of current or ex partner: Not on file    Emotionally abused: Not on file    Physically abused: Not on file    Forced sexual activity: Not on file  Other Topics Concern  . Not on file  Social History Narrative   Married (second marriage) has one son in [place]   Worked in Psychologist, educational   Working at a  [employer] as a Radiation protection practitioner   Enjoys exercising, hiking, kayaking.    Completed 12th grade   1 dog, 1 cat   Was raised by her grandmother.     Past Surgical History:  Procedure Laterality Date  . APPENDECTOMY  2001  . COLONOSCOPY    . ELBOW SURGERY Right 2016  . ENDOMETRIAL ABLATION  2003  . HIP FRACTURE SURGERY Right 2009   fell playing with dog  . labrum repair Right 2011  . NASAL SINUS SURGERY     1998  . OOPHORECTOMY Right 2001   due to large ovarian cyst  . ovarian cysts     removed x 4 before the oophorectomy  . POLYPECTOMY    . THYROIDECTOMY  1997    Family History  Problem Relation Age of Onset  . Hyperlipidemia Father   . Hypertension Father   . Heart failure Father 51  . Diabetes Sister   . Arthritis Paternal Grandmother   . Kidney disease Paternal Grandmother   . Diabetes Paternal Grandmother   . Arthritis Paternal Grandfather   . Colon cancer Neg Hx   . Colon polyps Neg Hx   . Esophageal cancer Neg Hx   . Rectal cancer Neg Hx    . Stomach cancer Neg Hx     Allergies  Allergen Reactions  . Tramadol Itching    Current Outpatient Medications on File Prior to Visit  Medication Sig Dispense Refill  . amitriptyline (ELAVIL) 25 MG tablet Take 1 tablet (25 mg total) by mouth at bedtime. 30 tablet 2  . atorvastatin (LIPITOR) 20 MG tablet Take 1 tablet (20 mg total) by mouth daily. 14 tablet 0  . empagliflozin (JARDIANCE) 10 MG TABS tablet Take 10 mg by mouth daily. 30 tablet 5  . escitalopram (LEXAPRO) 10 MG tablet TAKE 1 TABLET DAILY 90 tablet 1  . fluconazole (DIFLUCAN) 150 MG tablet Take 1 tab by mouth today, may repeat in 3 days as needed. 2 tablet 0  . glucose blood (ONETOUCH VERIO) test strip Use as instructed to check blood sugar one to two times a day.  DX  E11.9 200 each 1  . meclizine (ANTIVERT) 25 MG tablet Take 1 tablet (25 mg total) by mouth 3 (three) times daily as needed for dizziness. 30 tablet 0  . meloxicam (MOBIC) 7.5 MG tablet Take 1 tablet (7.5 mg total) by mouth daily. 14 tablet 0  . omeprazole (PRILOSEC) 40 MG capsule TAKE 1 CAPSULE DAILY 90 capsule 1  . ONETOUCH DELICA LANCETS 09Q MISC Use as instructed to check blood sugar one to two times a day.  DX  E11.9 200 each 1  . sitaGLIPtin (JANUVIA) 100 MG tablet Take 1 tablet (100 mg total) by mouth daily. 30 tablet 5  . SYNTHROID 100 MCG tablet TAKE 1 TABLET DAILY. PLEASEMAKE AN APPOINTMENT WITH   YOUR DOCTOR FOR ASSESSMENT AS SOON AS POSSIBLE. 30 tablet 0   Current Facility-Administered Medications on File Prior to Visit  Medication Dose Route Frequency Provider Last Rate Last Dose  . 0.9 %  sodium chloride infusion  500 mL Intravenous Continuous Pyrtle, Lajuan Lines, MD        BP 128/89 (BP Location: Right Arm, Patient Position: Sitting, Cuff Size: Small)   Pulse 69   Temp 98 F (36.7 C) (Oral)   Resp 16   Ht 5\' 3"  (1.6 m)   Wt 134 lb 3.2 oz (60.9 kg)   SpO2 97%   BMI 23.77 kg/m  Objective:   Physical Exam Constitutional:       Appearance: She is well-developed.  Neck:     Musculoskeletal: Neck supple.     Thyroid: No thyromegaly.  Cardiovascular:     Rate and Rhythm: Normal rate and regular rhythm.     Heart sounds: Normal heart sounds. No murmur.  Pulmonary:     Effort: Pulmonary effort is normal. No respiratory distress.     Breath sounds: Normal breath sounds. No wheezing.  Skin:    General: Skin is warm and dry.  Neurological:     Mental Status: She is alert and oriented to person, place, and time.  Psychiatric:        Behavior: Behavior normal.        Thought Content: Thought content normal.        Judgment: Judgment normal.           Assessment & Plan:  DM2- uncontrolled. Discussed importance of dietary compliance. Continue current meds. Pneumovax 23 today. Obtain follow up A1C.  Hyperlipidemia- tolerating statin. Obtain follow up lipid panel.  Hypothyroid- obtain follow up TSH. Continue synthroid.   Depression- stable on current meds. Continue same.

## 2018-02-28 NOTE — Patient Instructions (Signed)
Please complete lab work prior to leaving.   

## 2018-02-28 NOTE — Patient Instructions (Signed)
Wear an elbow sleeve as often as possible to help prevent the plica from snapping on the inside of your elbow. I don't see the ulnar nerve popping over the elbow which is a good sign. You can stop the rehab exercises now - the epicondylitis has improved. Continue aleve but hopefully back down to as needed over the next few weeks. Consider nitro patches, MRI, repeat ultrasound if not improving. Follow up with me in 6 weeks.

## 2018-02-28 NOTE — Telephone Encounter (Signed)
Please call [facility] [address] to request copy of eye exam

## 2018-03-01 ENCOUNTER — Encounter: Payer: Self-pay | Admitting: Family Medicine

## 2018-03-02 ENCOUNTER — Encounter: Payer: Self-pay | Admitting: Family

## 2018-03-04 ENCOUNTER — Encounter: Payer: Self-pay | Admitting: Family

## 2018-03-04 NOTE — Telephone Encounter (Signed)
Request faxed to [facility] at [phone]

## 2018-03-05 MED ORDER — ESCITALOPRAM OXALATE 10 MG PO TABS: 10.0000 mg | ORAL_TABLET | Freq: Every day | ORAL | 0 refills | Status: DC

## 2018-03-07 ENCOUNTER — Other Ambulatory Visit: Payer: BLUE CROSS/BLUE SHIELD

## 2018-03-14 ENCOUNTER — Other Ambulatory Visit: Payer: BLUE CROSS/BLUE SHIELD

## 2018-03-14 ENCOUNTER — Encounter: Payer: Self-pay | Admitting: Family

## 2018-03-14 ENCOUNTER — Ambulatory Visit: Payer: BLUE CROSS/BLUE SHIELD | Admitting: Family

## 2018-03-14 VITALS — BP 116/74 | HR 74 | Temp 97.6°F | Resp 16 | Ht 63.0 in | Wt 134.0 lb

## 2018-03-14 DIAGNOSIS — R110 Nausea: Secondary | ICD-10-CM

## 2018-03-14 LAB — BASIC METABOLIC PANEL
BUN: 19 mg/dL (ref 6–23)
CO2: 29 mEq/L (ref 19–32)
Calcium: 8.4 mg/dL (ref 8.4–10.5)
Chloride: 103 mEq/L (ref 96–112)
Creatinine, Ser: 0.79 mg/dL (ref 0.40–1.20)
GFR: 75.14 mL/min (ref >60.00)
Glucose, Bld: 150 mg/dL — ABNORMAL HIGH (ref 70–99)
Potassium: 4.2 mEq/L (ref 3.5–5.1)
Sodium: 141 mEq/L (ref 135–145)

## 2018-03-14 LAB — LIPID PANEL
Cholesterol: 153 mg/dL (ref 0–200)
HDL: 40.2 mg/dL (ref >39.00)
NonHDL: 112.52
Total CHOL/HDL Ratio: 4
Triglycerides: 225 mg/dL — ABNORMAL HIGH (ref 0.0–149.0)
VLDL: 45 mg/dL — ABNORMAL HIGH (ref 0.0–40.0)

## 2018-03-14 LAB — AMYLASE: AMYLASE: 82 U/L (ref 27–131)

## 2018-03-14 LAB — LIPASE: LIPASE: 38 U/L (ref 11.0–59.0)

## 2018-03-14 LAB — LDL CHOLESTEROL, DIRECT: Direct LDL: 95 mg/dL

## 2018-03-14 NOTE — Patient Instructions (Signed)
Please complete lab work prior to leaving. You should be contacted about scheduling your ultrasound. Continue protonix.

## 2018-03-14 NOTE — Progress Notes (Signed)
Subjective:    Patient ID: Donna Anderson, female    DOB: 1960-06-24, 58 y.o.   MRN: [id number]  HPI   Patient is a 58 yr old female who presents today to discuss nausea. Reports nausea occurs 3-4 times a day. Nausea began 6 weeks ago. She does take prilosec.  Reports that has improved her diet the last few weeks but this started before this.  Reports symptoms are worse after meals.  No vomiting.  Reports + constipation.  Using miralax every day (1 cap once daily). Helping some but not enough.  Notes nausea is accompanied by some upper abdominal "crampy pain." Denies bloating. Denies fever, black or bloody stools.    Review of Systems   See HPI  Past Medical History:  Diagnosis Date  . Allergy   . Anemia   . Anxiety   . Arthritis   . Depression   . Diabetes type 2, controlled ([place]) 06/21/2016  . Diverticulosis 2015   CT SCAN   . GERD (gastroesophageal reflux disease)   . Graves disease 1997  . Hearing loss   . History of chicken pox   . History of migraine   . Hyperlipidemia   . Hypertriglyceridemia 06/21/2016  . Kidney stones   . Osteopenia 07/08/2016  . Osteoporosis    osteopenia     Social History   Socioeconomic History  . Marital status: Married    Spouse name: Not on file  . Number of children: Not on file  . Years of education: Not on file  . Highest education level: Not on file  Occupational History  . Not on file  Social Needs  . Financial resource strain: Not on file  . Food insecurity:    Worry: Not on file    Inability: Not on file  . Transportation needs:    Medical: Not on file    Non-medical: Not on file  Tobacco Use  . Smoking status: Former Smoker    Years: 15.00    Last attempt to quit: 01/16/1999    Years since quitting: 19.1  . Smokeless tobacco: Never Used  Substance and Sexual Activity  . Alcohol use: Yes    Alcohol/week: 0.0 standard drinks    Comment: occasional  . Drug use: No  . Sexual activity: Yes    Birth control/protection:  None  Lifestyle  . Physical activity:    Days per week: Not on file    Minutes per session: Not on file  . Stress: Not on file  Relationships  . Social connections:    Talks on phone: Not on file    Gets together: Not on file    Attends religious service: Not on file    Active member of club or organization: Not on file    Attends meetings of clubs or organizations: Not on file    Relationship status: Not on file  . Intimate partner violence:    Fear of current or ex partner: Not on file    Emotionally abused: Not on file    Physically abused: Not on file    Forced sexual activity: Not on file  Other Topics Concern  . 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    Completed 12th grade   1 dog, 1 cat   Was raised by  her grandmother.     Past Surgical History:  Procedure Laterality Date  . APPENDECTOMY  2001  . COLONOSCOPY    . ELBOW SURGERY Right 2016  . ENDOMETRIAL ABLATION  2003  . HIP FRACTURE SURGERY Right 2009   fell playing with dog  . labrum repair Right 2011  . NASAL SINUS SURGERY     1998  . OOPHORECTOMY Right 2001   due to large ovarian cyst  . ovarian cysts     removed x 4 before the oophorectomy  . POLYPECTOMY    . THYROIDECTOMY  1997    Family History  Problem Relation Age of Onset  . Hyperlipidemia Father   . Hypertension Father   . Heart failure Father 32  . Diabetes Sister   . Arthritis Paternal Grandmother   . Kidney disease Paternal Grandmother   . Diabetes Paternal Grandmother   . Arthritis Paternal Grandfather   . Colon cancer Neg Hx   . Colon polyps Neg Hx   . Esophageal cancer Neg Hx   . Rectal cancer Neg Hx   . Stomach cancer Neg Hx     Allergies  Allergen Reactions  . Tramadol Itching    Current Outpatient Medications on File Prior to Visit  Medication Sig  Dispense Refill  . amitriptyline (ELAVIL) 25 MG tablet Take 1 tablet (25 mg total) by mouth at bedtime. 90 tablet 1  . atorvastatin (LIPITOR) 20 MG tablet Take 1 tablet (20 mg total) by mouth daily. 14 tablet 0  . empagliflozin (JARDIANCE) 10 MG TABS tablet Take 10 mg by mouth daily. 30 tablet 5  . escitalopram (LEXAPRO) 10 MG tablet Take 1 tablet (10 mg total) by mouth daily. 15 tablet 0  . glucose blood (ONETOUCH VERIO) test strip Use as instructed to check blood sugar one to two times a day.  DX  E11.9 200 each 1  . omeprazole (PRILOSEC) 40 MG capsule TAKE 1 CAPSULE DAILY 90 capsule 1  . ONETOUCH DELICA LANCETS 06Y MISC Use as instructed to check blood sugar one to two times a day.  DX  E11.9 200 each 1  . pioglitazone (ACTOS) 30 MG tablet Take 1 tablet (30 mg total) by mouth daily. 30 tablet 2  . sitaGLIPtin (JANUVIA) 100 MG tablet Take 1 tablet (100 mg total) by mouth daily. 30 tablet 5  . SYNTHROID 100 MCG tablet TAKE 1 TABLET DAILY. PLEASEMAKE AN APPOINTMENT WITH   YOUR DOCTOR FOR ASSESSMENT AS SOON AS POSSIBLE. 30 tablet 0   Current Facility-Administered Medications on File Prior to Visit  Medication Dose Route Frequency Provider Last Rate Last Dose  . 0.9 %  sodium chloride infusion  500 mL Intravenous Continuous Pyrtle, Lajuan Lines, MD        BP 116/74 (BP Location: Right Arm, Patient Position: Sitting, Cuff Size: Small)   Pulse 74   Temp 97.6 F (36.4 C) (Oral)   Resp 16   Ht 5\' 3"  (1.6 m)   Wt 134 lb (60.8 kg)   SpO2 98%   BMI 23.74 kg/m        Objective:   Physical Exam Constitutional:      Appearance: She is well-developed.  Neck:     Musculoskeletal: Neck supple.     Thyroid: No thyromegaly.  Cardiovascular:     Rate and Rhythm: Normal rate and regular rhythm.     Heart sounds: Normal heart sounds. No murmur.  Pulmonary:     Effort: Pulmonary effort is normal. No respiratory  distress.     Breath sounds: Normal breath sounds. No wheezing.  Abdominal:     General:  Bowel sounds are decreased. There is no distension.     Palpations: Abdomen is soft. There is no mass.  Skin:    General: Skin is warm and dry.  Neurological:     Mental Status: She is alert and oriented to person, place, and time.  Psychiatric:        Behavior: Behavior normal.        Thought Content: Thought content normal.        Judgment: Judgment normal.           Assessment & Plan:  Nausea- New. ? PUD/Gastritis, ? Cholecysitis- check LFT/lipase/amylase. If US/lab work unremarkable and symptoms persist, plan referral to GI. Pt is advised to continue PPI.

## 2018-03-16 ENCOUNTER — Encounter: Payer: Self-pay | Admitting: Family

## 2018-03-17 ENCOUNTER — Other Ambulatory Visit (INDEPENDENT_AMBULATORY_CARE_PROVIDER_SITE_OTHER): Payer: BLUE CROSS/BLUE SHIELD

## 2018-03-17 ENCOUNTER — Other Ambulatory Visit: Payer: Self-pay | Admitting: Family

## 2018-03-17 DIAGNOSIS — R10819 Abdominal tenderness, unspecified site: Secondary | ICD-10-CM | POA: Diagnosis not present

## 2018-03-17 LAB — HEPATIC FUNCTION PANEL
ALBUMIN: 4.4 g/dL (ref 3.5–5.2)
ALK PHOS: 92 U/L (ref 39–117)
ALT: 16 U/L (ref 0–35)
AST: 16 U/L (ref 0–37)
Bilirubin, Direct: 0.1 mg/dL (ref 0.0–0.3)
Total Bilirubin: 0.4 mg/dL (ref 0.2–1.2)
Total Protein: 6.6 g/dL (ref 6.0–8.3)

## 2018-03-20 ENCOUNTER — Ambulatory Visit (HOSPITAL_BASED_OUTPATIENT_CLINIC_OR_DEPARTMENT_OTHER)
Admission: RE | Admit: 2018-03-20 | Discharge: 2018-03-20 | Disposition: A | Discharge status: Home / Self Care | Payer: BLUE CROSS/BLUE SHIELD | Source: Ambulatory Visit | Attending: Family | Admitting: Family

## 2018-03-20 DIAGNOSIS — R110 Nausea: Secondary | ICD-10-CM | POA: Insufficient documentation

## 2018-03-20 DIAGNOSIS — K760 Fatty (change of) liver, not elsewhere classified: Secondary | ICD-10-CM | POA: Diagnosis not present

## 2018-03-21 ENCOUNTER — Telehealth: Payer: Self-pay | Admitting: Family

## 2018-03-21 DIAGNOSIS — K8689 Other specified diseases of pancreas: Secondary | ICD-10-CM

## 2018-03-21 NOTE — Telephone Encounter (Signed)
Reviewed [place] results.  Notes a mass on pancreas.  Could be a cyst but radiologist would like MRI to further evaluate.  I have pended order.  Please notify pt. I called her but got message that mailbox is full.

## 2018-03-21 NOTE — Telephone Encounter (Signed)
Patient advised of [place] results, she will like to go ahead with [place]. Order signed.

## 2018-03-29 ENCOUNTER — Ambulatory Visit (HOSPITAL_BASED_OUTPATIENT_CLINIC_OR_DEPARTMENT_OTHER)
Admission: RE | Admit: 2018-03-29 | Discharge: 2018-03-29 | Disposition: A | Discharge status: Home / Self Care | Payer: BLUE CROSS/BLUE SHIELD | Source: Ambulatory Visit | Attending: Family | Admitting: Family

## 2018-03-29 ENCOUNTER — Other Ambulatory Visit: Payer: Self-pay

## 2018-03-29 DIAGNOSIS — K8689 Other specified diseases of pancreas: Secondary | ICD-10-CM | POA: Diagnosis not present

## 2018-03-29 DIAGNOSIS — R110 Nausea: Secondary | ICD-10-CM | POA: Diagnosis not present

## 2018-03-29 MED ORDER — GADOBUTROL 1 MMOL/ML IV SOLN
6.0000 mL | Freq: Once | INTRAVENOUS | Status: AC | PRN
  Administered 2018-03-29: 6 mL via INTRAVENOUS

## 2018-03-30 ENCOUNTER — Telehealth: Payer: Self-pay | Admitting: Family

## 2018-03-30 DIAGNOSIS — R110 Nausea: Secondary | ICD-10-CM

## 2018-03-30 NOTE — Telephone Encounter (Signed)
See mychart.  

## 2018-04-08 ENCOUNTER — Encounter: Payer: Self-pay | Admitting: Family

## 2018-04-11 ENCOUNTER — Ambulatory Visit: Payer: BLUE CROSS/BLUE SHIELD | Admitting: Family Medicine

## 2018-04-28 ENCOUNTER — Encounter: Payer: Self-pay | Admitting: Family

## 2018-05-06 ENCOUNTER — Encounter: Payer: Self-pay | Admitting: Family

## 2018-05-07 ENCOUNTER — Encounter: Payer: Self-pay | Admitting: *Deleted

## 2018-05-08 ENCOUNTER — Ambulatory Visit (INDEPENDENT_AMBULATORY_CARE_PROVIDER_SITE_OTHER): Payer: BLUE CROSS/BLUE SHIELD | Admitting: Internal Medicine

## 2018-05-08 ENCOUNTER — Other Ambulatory Visit: Payer: Self-pay

## 2018-05-08 ENCOUNTER — Other Ambulatory Visit (INDEPENDENT_AMBULATORY_CARE_PROVIDER_SITE_OTHER): Payer: BLUE CROSS/BLUE SHIELD

## 2018-05-08 ENCOUNTER — Encounter: Payer: Self-pay | Admitting: Internal Medicine

## 2018-05-08 VITALS — Ht 63.0 in | Wt 127.0 lb

## 2018-05-08 DIAGNOSIS — R1013 Epigastric pain: Secondary | ICD-10-CM | POA: Diagnosis not present

## 2018-05-08 DIAGNOSIS — K5900 Constipation, unspecified: Secondary | ICD-10-CM | POA: Diagnosis not present

## 2018-05-08 DIAGNOSIS — Z86010 Personal history of colonic polyps: Secondary | ICD-10-CM

## 2018-05-08 DIAGNOSIS — R110 Nausea: Secondary | ICD-10-CM

## 2018-05-08 DIAGNOSIS — K219 Gastro-esophageal reflux disease without esophagitis: Secondary | ICD-10-CM

## 2018-05-08 LAB — IGA: IgA: 162 mg/dL (ref 68–378)

## 2018-05-08 MED ORDER — LINACLOTIDE 72 MCG PO CAPS: 72.0000 ug | ORAL_CAPSULE | Freq: Every day | ORAL | 2 refills | Status: DC

## 2018-05-08 NOTE — Progress Notes (Signed)
Patient ID: Donna Anderson, female   DOB: 09/15/1960, 58 y.o.   MRN: [id number] This service was provided via telemedicine.  Zoom platform with A/V communication The patient was located at home The provider was located in [place] office The patient did consent to this telephone visit and is aware of possible charges through their insurance for this visit.   The patient was referred by Debbrah Alar  The other persons participating in this telemedicine service were patient and I Time spent on call: 20 min  HPI: Donna Anderson is a 58 year old female known to me from colonoscopy with a past medical history of adenomatous colon polyp, diverticulosis, diabetes, hypothyroidism, hyperlipidemia, history of anxiety and depression who is seen in consult at the request of Debbrah Alar, NP to evaluate nausea.  He is seen by Zoom platform today in the setting of COVID-19 pandemic.  Donna Anderson reports that she has been having nausea, postprandially, for about 10 weeks.  This happens 1 to 1-1/2 hours after she eats.  It is associated with an epigastric pain.  For the most part this pain remains in the anterior epigastrium but can radiate more to the right upper abdomen and towards her back.  Can last 10 minutes to an hour.  Seems to be worse with whole grains.  She wonders if this is gluten related.  She is changed her diet and avoiding carbohydrates more recently and has lost 8 pounds in 6 weeks.  She is also developed constipation in the last several months and has been using MiraLAX.  When she was using MiraLAX 17 g daily her stools were too loose and so she is changed to every other day.  She still feels that bowel movements are incomplete and at times require straining for passage.  No blood in her stool or melena.  She has been on omeprazole for about a year for reflux.  She takes 40 mg a day and this has relieved heartburn.  She reports with her epigastric pain it feels like she is going to develop acid  reflux and heartburn but it never gets to this point.  Again she does not have vomiting.  She avoids lactose and feels that she is lactose intolerance.  No dysphagia or odynophagia.  She started Actos 2 or 3 months ago which is improved her blood sugars.  She has been on [place] for some time.  She has not been taking anything for nausea.  There is no known family history of celiac disease.  Her colonoscopy was performed on 11/09/2016.  Showed left-sided diverticulosis and a 6 mm sigmoid tubular adenoma which was removed.  Internal hemorrhoids were seen.  Of note primary care did an ultrasound of the abdomen followed by an MRI of the abdomen which are below.  Reviewed.   Past Medical History:  Diagnosis Date  . Allergy   . Anemia   . Anxiety   . Aortic atherosclerosis ([place])   . Arthritis   . Depression   . Diabetes type 2, controlled ([place]) 06/21/2016  . Diverticulosis 2015   CT SCAN   . GERD (gastroesophageal reflux disease)   . Graves disease 1997  . Hearing loss   . Hepatic steatosis   . History of chicken pox   . History of migraine   . Hyperlipidemia   . Hypertriglyceridemia 06/21/2016  . Internal hemorrhoids   . Kidney stones   . Osteopenia 07/08/2016  . Osteoporosis    osteopenia  . Tubular adenoma  of colon     Past Surgical History:  Procedure Laterality Date  . APPENDECTOMY  2001  . COLONOSCOPY    . ELBOW SURGERY Right 2016  . ENDOMETRIAL ABLATION  2003  . HIP FRACTURE SURGERY Right 2009   fell playing with dog  . labrum repair Right 2011  . NASAL SINUS SURGERY     1998  . OOPHORECTOMY Right 2001   due to large ovarian cyst  . ovarian cysts     removed x 4 before the oophorectomy  . POLYPECTOMY    . THYROIDECTOMY  1997    Outpatient Medications Prior to Visit  Medication Sig Dispense Refill  . amitriptyline (ELAVIL) 25 MG tablet Take 1 tablet (25 mg total) by mouth at bedtime. 90 tablet 1  . atorvastatin (LIPITOR) 20 MG tablet Take 1  tablet (20 mg total) by mouth daily. 14 tablet 0  . empagliflozin (JARDIANCE) 10 MG TABS tablet Take 10 mg by mouth daily. 30 tablet 5  . escitalopram (LEXAPRO) 10 MG tablet Take 1 tablet (10 mg total) by mouth daily. 15 tablet 0  . glucose blood (ONETOUCH VERIO) test strip Use as instructed to check blood sugar one to two times a day.  DX  E11.9 200 each 1  . levothyroxine (SYNTHROID) 100 MCG tablet Take 1 tablet (100 mcg total) by mouth daily before breakfast. 90 tablet 1  . omeprazole (PRILOSEC) 40 MG capsule TAKE 1 CAPSULE DAILY 90 capsule 1  . ONETOUCH DELICA LANCETS 96E MISC Use as instructed to check blood sugar one to two times a day.  DX  E11.9 200 each 1  . pioglitazone (ACTOS) 30 MG tablet Take 1 tablet (30 mg total) by mouth daily. 30 tablet 2  . sitaGLIPtin (JANUVIA) 100 MG tablet Take 1 tablet (100 mg total) by mouth daily. 30 tablet 5   No facility-administered medications prior to visit.     Allergies  Allergen Reactions  . Tramadol Itching    Family History  Problem Relation Age of Onset  . Hyperlipidemia Father   . Hypertension Father   . Heart failure Father 1  . Diabetes Sister   . Arthritis Paternal Grandmother   . Kidney disease Paternal Grandmother   . Diabetes Paternal Grandmother   . Arthritis Paternal Grandfather   . Colon cancer Neg Hx   . Colon polyps Neg Hx   . Esophageal cancer Neg Hx   . Rectal cancer Neg Hx   . Stomach cancer Neg Hx   . Liver disease Neg Hx   . Inflammatory bowel disease Neg Hx     Social History   Tobacco Use  . Smoking status: Former Smoker    Years: 15.00    Last attempt to quit: 01/16/1999    Years since quitting: 19.3  . Smokeless tobacco: Never Used  Substance Use Topics  . Alcohol use: Not Currently    Alcohol/week: 0.0 standard drinks    Comment: none in 2 years as of 05/07/18  . Drug use: No    ROS: As per history of present illness, otherwise negative  Ht 5\' 3"  (1.6 m)   Wt 127 lb (57.6 kg)   BMI 22.50  kg/m  No exam, virtual visit  RELEVANT LABS AND IMAGING: CBC    Component Value Date/Time   WBC 7.5 07/03/2016 1321   RBC 4.40 07/03/2016 1321   HGB 13.5 07/03/2016 1321   HCT 40.3 07/03/2016 1321   PLT 223 07/03/2016 1321   MCV 91.6  07/03/2016 1321   MCH 30.7 07/03/2016 1321   MCHC 33.5 07/03/2016 1321   RDW 12.7 07/03/2016 1321   LYMPHSABS 2.0 06/15/2016 1035   MONOABS 0.4 06/15/2016 1035   EOSABS 0.1 06/15/2016 1035   BASOSABS 0.1 06/15/2016 1035    CMP     Component Value Date/Time   NA 141 03/14/2018 1050   K 4.2 03/14/2018 1050   CL 103 03/14/2018 1050   CO2 29 03/14/2018 1050   GLUCOSE 150 (H) 03/14/2018 1050   BUN 19 03/14/2018 1050   CREATININE 0.79 03/14/2018 1050   CREATININE 0.76 12/01/2012 1457   CALCIUM 8.4 03/14/2018 1050   PROT 6.6 03/17/2018 1324   ALBUMIN 4.4 03/17/2018 1324   AST 16 03/17/2018 1324   ALT 16 03/17/2018 1324   ALKPHOS 92 03/17/2018 1324   BILITOT 0.4 03/17/2018 1324   GFRNONAA >60 07/03/2016 1321   GFRAA >60 07/03/2016 1321   Lab Results  Component Value Date   TSH 4.28 02/28/2018     ABDOMEN ULTRASOUND COMPLETE   COMPARISON:  Body CT 07/29/2015   FINDINGS: Gallbladder: No gallstones or wall thickening visualized. No sonographic Murphy sign noted by sonographer.   Common bile duct: Diameter: 4 mm   Liver: No focal lesion identified. Mildly increased parenchymal echogenicity. Portal vein is patent on color Doppler imaging with normal direction of blood flow towards the liver.   IVC: No abnormality visualized.   Pancreas: There is a suggestion of a hypoechoic mass within the body of the pancreas measuring 1.4 by 1.4 by 1.5 cm.   Spleen: Size and appearance within normal limits.   Right Kidney: Length: 9.4 cm. Echogenicity within normal limits. No mass or hydronephrosis visualized.   Left Kidney: Length: 9.0 cm. Echogenicity within normal limits. No mass or hydronephrosis visualized.   Abdominal aorta: No  aneurysm visualized.  Mild atherosclerosis.   Other findings: None.   IMPRESSION: A suggestion of a hypoechoic mass within the body of the pancreas measuring 1.5 cm. Further evaluation with contrast-enhanced MRI of the abdomen, pancreatic protocol, may be considered.   No evidence of cholelithiasis or acute cholecystitis.   Mild hepatic steatosis.     Electronically Signed   By: Fidela Salisbury M.D.   On: 03/20/2018 14:35   MRI ABDOMEN WITHOUT AND WITH CONTRAST   TECHNIQUE: Multiplanar multisequence MR imaging of the abdomen was performed both before and after the administration of intravenous contrast.   CONTRAST:  6 mL Gadavist   COMPARISON:  CT abdomen pelvis 07/29/2015   FINDINGS: Lower chest: Normal heart size.  No pleural effusions.   Hepatobiliary: The liver is normal in size and contour. No focal hepatic lesion is identified. Gallbladder is unremarkable. No intrahepatic or extrahepatic biliary ductal dilatation.   Pancreas: Mild fatty atrophy of the pancreatic head. No discrete pancreatic mass identified. No surrounding inflammatory changes.   Spleen:  Unremarkable   Adrenals/Urinary Tract: Normal adrenal glands. Kidneys enhance symmetrically with contrast. No hydronephrosis.   Stomach/Bowel: Normal morphology of the stomach. No abnormal bowel wall thickening or evidence for bowel obstruction. No free fluid or free intraperitoneal air.   Vascular/Lymphatic: Normal caliber abdominal aorta. No retroperitoneal lymphadenopathy.   Other:  None.   Musculoskeletal: No aggressive or acute appearing osseous lesions.   IMPRESSION: No acute process within the abdomen.   No discrete pancreatic mass visualized on current exam.     Electronically Signed   By: Lovey Newcomer M.D.   On: 03/29/2018 15:11    ASSESSMENT/PLAN:  58 year old female known to me from colonoscopy with a past medical history of adenomatous colon polyp, diverticulosis, diabetes,  hypothyroidism, hyperlipidemia, history of anxiety and depression who is seen in consult at the request of Debbrah Alar, NP to evaluate nausea.   1.  Epigastric pain/nausea --differential includes celiac disease, gallbladder dysfunction/biliary dyskinesia, gastroparesis, and gastritis/ulcer disease.  Her ultrasound was concerning for a possible pancreatic lesion but MRI ruled this out, fortunately.  MRI showed no acute process within the abdomen.  Gallbladder looked fine by imaging.  We discussed how biliary dyskinesia could still be the etiology despite normal imaging. --Check celiac panel, patient will come to our lab for this test --If negative proceed to [facility] HIDA scan.;  Would ask that radiology to [facility] rather than ensure given intolerance to this product --If CCK HIDA negative then gastric emptying scan --If gastric emptying scan negative then EGD --Zofran was not used due to side effect of constipation and her issues with constipation currently  2.  Constipation/diverticulosis --MiraLAX has been incompletely effective.  Trial of Linzess 72 mcg once daily, 30 minutes before breakfast.  We discussed that diarrhea would be the side effect to watch for.  Dose may need to be titrated.  3.  History of adenomatous colon polyp --up-to-date on screening/surveillance with last exam Oct of 2018.  4.  GERD --GERD symptoms appear well controlled on once daily omeprazole.  Continue omeprazole 40 mg daily for now.   [place], [address], [address], [place] [id number]

## 2018-05-08 NOTE — Patient Instructions (Signed)
We have sent the following medications to your pharmacy for you to pick up at your convenience: Linzess 72 mcg once daily, 30 minutes prior to breakfast.  Your provider has requested that you go to the basement level for lab work today (05/08/18). Press "B" on the elevator. The lab is located at the first door on the left as you exit the elevator.

## 2018-05-08 NOTE — Addendum Note (Signed)
Addended by: Larina Bras on: 05/08/2018 11:53 AM   Modules accepted: Orders

## 2018-05-09 LAB — TISSUE TRANSGLUTAMINASE, IGA: (tTG) Ab, IgA: 1 U/mL

## 2018-05-13 ENCOUNTER — Other Ambulatory Visit: Payer: Self-pay

## 2018-05-13 DIAGNOSIS — R1013 Epigastric pain: Secondary | ICD-10-CM

## 2018-05-13 DIAGNOSIS — R1011 Right upper quadrant pain: Secondary | ICD-10-CM

## 2018-05-13 DIAGNOSIS — R110 Nausea: Secondary | ICD-10-CM

## 2018-05-15 ENCOUNTER — Encounter: Payer: Self-pay | Admitting: Family Medicine

## 2018-05-27 ENCOUNTER — Ambulatory Visit (HOSPITAL_COMMUNITY)
Admission: RE | Admit: 2018-05-27 | Discharge: 2018-05-27 | Disposition: A | Discharge status: Home / Self Care | Payer: BLUE CROSS/BLUE SHIELD | Source: Ambulatory Visit | Attending: Internal Medicine | Admitting: Internal Medicine

## 2018-05-27 ENCOUNTER — Other Ambulatory Visit: Payer: Self-pay

## 2018-05-27 DIAGNOSIS — R110 Nausea: Secondary | ICD-10-CM | POA: Diagnosis not present

## 2018-05-27 DIAGNOSIS — R109 Unspecified abdominal pain: Secondary | ICD-10-CM | POA: Diagnosis not present

## 2018-05-27 DIAGNOSIS — R1011 Right upper quadrant pain: Secondary | ICD-10-CM | POA: Insufficient documentation

## 2018-05-27 DIAGNOSIS — R1013 Epigastric pain: Secondary | ICD-10-CM | POA: Insufficient documentation

## 2018-05-27 MED ORDER — TECHNETIUM TC 99M MEBROFENIN IV KIT
5.3500 | PACK | Freq: Once | INTRAVENOUS | Status: AC | PRN
  Administered 2018-05-27: 08:00:00 5.35 via INTRAVENOUS

## 2018-05-28 ENCOUNTER — Other Ambulatory Visit: Payer: Self-pay

## 2018-05-28 DIAGNOSIS — R110 Nausea: Secondary | ICD-10-CM

## 2018-05-28 DIAGNOSIS — R1013 Epigastric pain: Secondary | ICD-10-CM

## 2018-06-03 ENCOUNTER — Other Ambulatory Visit: Payer: Self-pay | Admitting: Family

## 2018-06-06 ENCOUNTER — Telehealth (INDEPENDENT_AMBULATORY_CARE_PROVIDER_SITE_OTHER): Payer: BLUE CROSS/BLUE SHIELD | Admitting: Family

## 2018-06-06 ENCOUNTER — Other Ambulatory Visit: Payer: Self-pay

## 2018-06-06 ENCOUNTER — Encounter: Payer: Self-pay | Admitting: Family

## 2018-06-06 DIAGNOSIS — E039 Hypothyroidism, unspecified: Secondary | ICD-10-CM | POA: Diagnosis not present

## 2018-06-06 DIAGNOSIS — F419 Anxiety disorder, unspecified: Secondary | ICD-10-CM

## 2018-06-06 DIAGNOSIS — K581 Irritable bowel syndrome with constipation: Secondary | ICD-10-CM

## 2018-06-06 DIAGNOSIS — K219 Gastro-esophageal reflux disease without esophagitis: Secondary | ICD-10-CM

## 2018-06-06 DIAGNOSIS — E785 Hyperlipidemia, unspecified: Secondary | ICD-10-CM | POA: Diagnosis not present

## 2018-06-06 DIAGNOSIS — E1165 Type 2 diabetes mellitus with hyperglycemia: Secondary | ICD-10-CM

## 2018-06-06 MED ORDER — ONDANSETRON HCL 4 MG PO TABS: 4.0000 mg | ORAL_TABLET | Freq: Three times a day (TID) | ORAL | 2 refills | Status: DC | PRN

## 2018-06-06 NOTE — Progress Notes (Signed)
Virtual Visit via Video Note  I connected with@ on 06/06/18 at  9:00 AM EDT by a video enabled telemedicine application and verified that I am speaking with the correct person using two identifiers. This visit type was conducted due to national recommendations for restrictions regarding the COVID-19 Pandemic (e.g. social distancing).  This format is felt to be most appropriate for this patient at this time.   I discussed the limitations of evaluation and management by telemedicine and the availability of in person appointments. The patient expressed understanding and agreed to proceed.  Only the patient and myself were on today's video visit. The patient was at home and I was in my office at the time of today's visit.   History of Present Illness:  Patient is a 58 yr old female who presents today for follow.  Chronic nausea- stared linzess.  Still not having a daily BM. Reports that she had a bad episode last night which lasted about an hour. Did not vomit, just had severe nausea. She is scheduled for a gastric emptying study.  Reports that she is feeling really frustrated.    DM- Reports that sugars 107-125.  Reports that sugars are significantly improved on current regimen.  Lab Results  Component Value Date   HGBA1C 8.1 (H) 02/28/2018   HGBA1C 8.7 (H) 06/28/2017   HGBA1C 7.5 (H) 09/21/2016   Lab Results  Component Value Date   MICROALBUR 0.9 06/28/2017   CREATININE 0.79 03/14/2018     Anxiety- reports that her mood is great.  She continues lexapro.   Hypothyroid-  Continues synthroid.  Feels well on current dose.  Lab Results  Component Value Date   TSH 4.28 02/28/2018    Hyperlipidemia-  Lab Results  Component Value Date   CHOL 153 03/14/2018   HDL 40.20 03/14/2018   LDLDIRECT 95.0 03/14/2018   TRIG 225.0 (H) 03/14/2018   CHOLHDL 4 03/14/2018    GERD- continues omeprazole.    Observations/Objective:   Gen: Awake, alert, no acute distress Resp: Breathing is  even and non-labored Psych: calm/pleasant demeanor Neuro: Alert and Oriented x 3, + facial symmetry, speech is clear.  Assessment and Plan:  1) Hyperlipidemia- maintained on lipitor.  2) DM2- clinically improved. Continue current meds.  3) IBS- continue linzess.  Add prn zofran for nausea. Await GES per GI for further evaluation.  4) GERD- stable on PPI.  5) Hypothyroid- clinically stable on synthroid. Continue same.  Lab Results  Component Value Date   TSH 4.28 02/28/2018    Follow Up Instructions:    I discussed the assessment and treatment plan with the patient. The patient was provided an opportunity to ask questions and all were answered. The patient agreed with the plan and demonstrated an understanding of the instructions.   The patient was advised to call back or seek an in-person evaluation if the symptoms worsen or if the condition fails to improve as anticipated.    Nance Pear, NP

## 2018-06-10 ENCOUNTER — Ambulatory Visit (HOSPITAL_COMMUNITY)
Admission: RE | Admit: 2018-06-10 | Discharge: 2018-06-10 | Disposition: A | Discharge status: Home / Self Care | Payer: BLUE CROSS/BLUE SHIELD | Source: Ambulatory Visit | Attending: Internal Medicine | Admitting: Internal Medicine

## 2018-06-10 ENCOUNTER — Other Ambulatory Visit: Payer: Self-pay

## 2018-06-10 DIAGNOSIS — R110 Nausea: Secondary | ICD-10-CM | POA: Insufficient documentation

## 2018-06-10 DIAGNOSIS — R1013 Epigastric pain: Secondary | ICD-10-CM | POA: Insufficient documentation

## 2018-06-10 DIAGNOSIS — R109 Unspecified abdominal pain: Secondary | ICD-10-CM | POA: Diagnosis not present

## 2018-06-10 MED ORDER — TECHNETIUM TC 99M SULFUR COLLOID
1.8000 | Freq: Once | INTRAVENOUS | Status: AC | PRN
  Administered 2018-06-10: 1.8 via INTRAVENOUS

## 2018-06-11 ENCOUNTER — Other Ambulatory Visit: Payer: Self-pay

## 2018-06-11 MED ORDER — METOCLOPRAMIDE HCL 5 MG PO TABS: 5.0000 mg | ORAL_TABLET | Freq: Three times a day (TID) | ORAL | 0 refills | Status: DC

## 2018-06-12 ENCOUNTER — Other Ambulatory Visit: Payer: Self-pay | Admitting: Family

## 2018-06-18 ENCOUNTER — Encounter: Payer: Self-pay | Admitting: Family

## 2018-06-18 ENCOUNTER — Ambulatory Visit (INDEPENDENT_AMBULATORY_CARE_PROVIDER_SITE_OTHER): Payer: BLUE CROSS/BLUE SHIELD | Admitting: Family

## 2018-06-18 DIAGNOSIS — K581 Irritable bowel syndrome with constipation: Secondary | ICD-10-CM

## 2018-06-18 MED ORDER — LINACLOTIDE 145 MCG PO CAPS: 145.0000 ug | ORAL_CAPSULE | Freq: Every day | ORAL | 3 refills | Status: DC

## 2018-06-18 NOTE — Progress Notes (Signed)
Virtual Visit via Video Note  I connected with@ on 06/18/18 at  2:20 PM EDT by a video enabled telemedicine application and verified that I am speaking with the correct person using two identifiers. This visit type was conducted due to national recommendations for restrictions regarding the COVID-19 Pandemic (e.g. social distancing).  This format is felt to be most appropriate for this patient at this time.   I discussed the limitations of evaluation and management by telemedicine and the availability of in person appointments. The patient expressed understanding and agreed to proceed.  Only the patient and myself were on today's video visit. The patient was at home and I was in my office at the time of today's visit.   History of Present Illness:  Patient is a 58 yr old female who presents today with chief complaint of constipation. Last BM was 3 days ago. Started linzess per GI provider and notes that she has not had any improvement, perhaps symptoms have worsened. Notes good water intake. States, "I really have to strain to go."  Past Medical History:  Diagnosis Date  . Allergy   . Anemia   . Anxiety   . Aortic atherosclerosis ([place])   . Arthritis   . Depression   . Diabetes type 2, controlled ([place]) 06/21/2016  . Diverticulosis 2015   CT SCAN   . GERD (gastroesophageal reflux disease)   . Graves disease 1997  . Hearing loss   . Hepatic steatosis   . History of chicken pox   . History of migraine   . Hyperlipidemia   . Hypertriglyceridemia 06/21/2016  . Internal hemorrhoids   . Kidney stones   . Osteopenia 07/08/2016  . Osteoporosis    osteopenia  . Tubular adenoma of colon      Social History   Socioeconomic History  . Marital status: Married    Spouse name: Not on file  . Number of children: Not on file  . Years of education: Not on file  . Highest education level: Not on file  Occupational History  . Not on file  Social Needs  . Financial resource strain: Not on file   . Food insecurity:    Worry: Not on file    Inability: Not on file  . Transportation needs:    Medical: Not on file    Non-medical: Not on file  Tobacco Use  . Smoking status: Former Smoker    Years: 15.00    Last attempt to quit: 01/16/1999    Years since quitting: 19.4  . Smokeless tobacco: Never Used  Substance and Sexual Activity  . Alcohol use: Not Currently    Alcohol/week: 0.0 standard drinks    Comment: none in 2 years as of 05/07/18  . Drug use: No  . Sexual activity: Yes    Birth control/protection: None  Lifestyle  . Physical activity:    Days per week: Not on file    Minutes per session: Not on file  . Stress: Not on file  Relationships  . Social connections:    Talks on phone: Not on file    Gets together: Not on file    Attends religious service: Not on file    Active member of club or organization: Not on file    Attends meetings of clubs or organizations: Not on file    Relationship status: Not on file  . Intimate partner violence:    Fear of current or ex partner: Not on file    Emotionally  abused: Not on file    Physically abused: Not on file    Forced sexual activity: Not on file  Other Topics Concern  . 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    Completed 12th grade   1 dog, 1 cat   Was raised by her grandmother.     Past Surgical History:  Procedure Laterality Date  . APPENDECTOMY  2001  . COLONOSCOPY    . ELBOW SURGERY Right 2016  . ENDOMETRIAL ABLATION  2003  . HIP FRACTURE SURGERY Right 2009   fell playing with dog  . labrum repair Right 2011  . NASAL SINUS SURGERY     1998  . OOPHORECTOMY Right 2001   due to large ovarian cyst  . ovarian cysts     removed x 4 before the oophorectomy  . POLYPECTOMY    . THYROIDECTOMY  1997    Family History  Problem Relation Age  of Onset  . Hyperlipidemia Father   . Hypertension Father   . Heart failure Father 39  . Diabetes Sister   . Arthritis Paternal Grandmother   . Kidney disease Paternal Grandmother   . Diabetes Paternal Grandmother   . Arthritis Paternal Grandfather   . Colon cancer Neg Hx   . Colon polyps Neg Hx   . Esophageal cancer Neg Hx   . Rectal cancer Neg Hx   . Stomach cancer Neg Hx   . Liver disease Neg Hx   . Inflammatory bowel disease Neg Hx     Allergies  Allergen Reactions  . Tramadol Itching    Current Outpatient Medications on File Prior to Visit  Medication Sig Dispense Refill  . amitriptyline (ELAVIL) 25 MG tablet Take 1 tablet (25 mg total) by mouth at bedtime. 90 tablet 1  . atorvastatin (LIPITOR) 20 MG tablet TAKE 1 TABLET DAILY 90 tablet 1  . empagliflozin (JARDIANCE) 10 MG TABS tablet Take 10 mg by mouth daily. 30 tablet 5  . escitalopram (LEXAPRO) 10 MG tablet Take 1 tablet (10 mg total) by mouth daily. 15 tablet 0  . glucose blood (ONETOUCH VERIO) test strip Use as instructed to check blood sugar one to two times a day.  DX  E11.9 200 each 1  . levothyroxine (SYNTHROID) 100 MCG tablet Take 1 tablet (100 mcg total) by mouth daily before breakfast. 90 tablet 1  . metoCLOPramide (REGLAN) 5 MG tablet Take 1 tablet (5 mg total) by mouth 3 (three) times daily before meals. 90 tablet 0  . omeprazole (PRILOSEC) 40 MG capsule TAKE 1 CAPSULE DAILY 90 capsule 1  . ondansetron (ZOFRAN) 4 MG tablet Take 1 tablet (4 mg total) by mouth every 8 (eight) hours as needed for nausea or vomiting. 30 tablet 2  . ONETOUCH DELICA LANCETS 18A MISC Use as instructed to check blood sugar one to two times a day.  DX  E11.9 200 each 1  . pioglitazone (ACTOS) 30 MG tablet Take 1 tablet by mouth once daily 30 tablet 5  . sitaGLIPtin (JANUVIA) 100 MG tablet Take 1 tablet (100 mg total) by mouth daily. 30 tablet 5   No current facility-administered medications on file prior to visit.     There were no  vitals taken for this visit.   Observations/Objective:   Gen: Awake, alert, no acute  distress Resp: Breathing is even and non-labored Psych: calm/pleasant demeanor Neuro: Alert and Oriented x 3, + facial symmetry, speech is clear.  Assessment and Plan:  Constipation- uncontrolled. Advised pt to increase linzess from 72 mcg to 145 mcg. Discussed importance of good fiber intake, good water intake. Pt is advised to call if symptoms worsen or if they fail to improve. Pt verbalizes understanding.  Follow Up Instructions:    I discussed the assessment and treatment plan with the patient. The patient was provided an opportunity to ask questions and all were answered. The patient agreed with the plan and demonstrated an understanding of the instructions.   The patient was advised to call back or seek an in-person evaluation if the symptoms worsen or if the condition fails to improve as anticipated.    Nance Pear, NP

## 2018-06-18 NOTE — Telephone Encounter (Signed)
See video visit

## 2018-06-24 ENCOUNTER — Encounter: Payer: Self-pay | Admitting: Family

## 2018-06-24 NOTE — Telephone Encounter (Signed)
  Please let patient know I received her email. I would continue to further dose titrate Linzess and therefore increase Linzess to 290 mcg daily, this is best taken 30 minutes before breakfast daily She can take MiraLAX with this if needed but hopefully we can find the right dose of a single laxative for efficacy  She should continue to keep me posted

## 2018-07-11 ENCOUNTER — Telehealth: Payer: Self-pay | Admitting: Internal Medicine

## 2018-07-11 ENCOUNTER — Other Ambulatory Visit: Payer: Self-pay | Admitting: Internal Medicine

## 2018-07-11 NOTE — Telephone Encounter (Signed)
sabrina from pharmacy called wanting a verbal authorization for the medication metoCLOPramide (REGLAN) 5 MG tablet [[id number]]  Please call back [phone]. she stated that patient is going out of town and need it soon.

## 2018-07-11 NOTE — Telephone Encounter (Signed)
Rx sent 

## 2018-07-15 ENCOUNTER — Other Ambulatory Visit: Payer: Self-pay | Admitting: Family

## 2018-07-21 ENCOUNTER — Encounter: Payer: Self-pay | Admitting: Family

## 2018-07-23 ENCOUNTER — Ambulatory Visit (INDEPENDENT_AMBULATORY_CARE_PROVIDER_SITE_OTHER): Payer: BC Managed Care – PPO | Admitting: Family

## 2018-07-23 ENCOUNTER — Encounter: Payer: Self-pay | Admitting: Family

## 2018-07-23 ENCOUNTER — Other Ambulatory Visit: Payer: Self-pay

## 2018-07-23 ENCOUNTER — Ambulatory Visit (HOSPITAL_BASED_OUTPATIENT_CLINIC_OR_DEPARTMENT_OTHER)
Admission: RE | Admit: 2018-07-23 | Discharge: 2018-07-23 | Disposition: A | Discharge status: Home / Self Care | Payer: BC Managed Care – PPO | Source: Ambulatory Visit | Attending: Family | Admitting: Family

## 2018-07-23 ENCOUNTER — Other Ambulatory Visit: Payer: Self-pay | Admitting: Family

## 2018-07-23 DIAGNOSIS — R0789 Other chest pain: Secondary | ICD-10-CM | POA: Diagnosis not present

## 2018-07-23 DIAGNOSIS — R0781 Pleurodynia: Secondary | ICD-10-CM | POA: Diagnosis not present

## 2018-07-23 NOTE — Progress Notes (Signed)
Virtual Visit via Video Note  I connected with Donna Anderson on 07/23/18 at  8:40 AM EDT by a video enabled telemedicine application and verified that I am speaking with the correct person using two identifiers.  Location: Patient: home Provider: work    I discussed the limitations of evaluation and management by telemedicine and the availability of in person appointments. The patient expressed understanding and agreed to proceed.  History of Present Illness:  Patient is a 58 yr old female who presents today with chief complaint of left sided chest tenderness.  Reports that she picked up and grandson and heard a "pop."  This occurred 2 weeks ago and she has been having pain ever since.  Reports that lifting, deep breath and moving around worsen the pain. Using ibuprofen prn.  Reports that this is helpful except "if I overdue it."  Reports pain is located up under the left breast and is tender to palpation.  Past Medical History:  Diagnosis Date  . Allergy   . Anemia   . Anxiety   . Aortic atherosclerosis ([place])   . Arthritis   . Depression   . Diabetes type 2, controlled ([place]) 06/21/2016  . Diverticulosis 2015   CT SCAN   . GERD (gastroesophageal reflux disease)   . Graves disease 1997  . Hearing loss   . Hepatic steatosis   . History of chicken pox   . History of migraine   . Hyperlipidemia   . Hypertriglyceridemia 06/21/2016  . Internal hemorrhoids   . Kidney stones   . Osteopenia 07/08/2016  . Osteoporosis    osteopenia  . Tubular adenoma of colon      Social History   Socioeconomic History  . Marital status: Married    Spouse name: Not on file  . Number of children: Not on file  . Years of education: Not on file  . Highest education level: Not on file  Occupational History  . Not on file  Social Needs  . Financial resource strain: Not on file  . Food insecurity    Worry: Not on file    Inability: Not on file  . Transportation needs    Medical: Not on file   Non-medical: Not on file  Tobacco Use  . Smoking status: Former Smoker    Years: 15.00    Quit date: 01/16/1999    Years since quitting: 19.5  . Smokeless tobacco: Never Used  Substance and Sexual Activity  . Alcohol use: Not Currently    Alcohol/week: 0.0 standard drinks    Comment: none in 2 years as of 05/07/18  . Drug use: No  . Sexual activity: Yes    Birth control/protection: None  Lifestyle  . Physical activity    Days per week: Not on file    Minutes per session: Not on file  . Stress: Not on file  Relationships  . Social Herbalist on phone: Not on file    Gets together: Not on file    Attends religious service: Not on file    Active member of club or organization: Not on file    Attends meetings of clubs or organizations: Not on file    Relationship status: Not on file  . Intimate partner violence    Fear of current or ex partner: Not on file    Emotionally abused: Not on file    Physically abused: Not on file    Forced sexual activity: Not on file  Other Topics Concern  . 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    Completed 12th grade   1 dog, 1 cat   Was raised by her grandmother.     Past Surgical History:  Procedure Laterality Date  . APPENDECTOMY  2001  . COLONOSCOPY    . ELBOW SURGERY Right 2016  . ENDOMETRIAL ABLATION  2003  . HIP FRACTURE SURGERY Right 2009   fell playing with dog  . labrum repair Right 2011  . NASAL SINUS SURGERY     1998  . OOPHORECTOMY Right 2001   due to large ovarian cyst  . ovarian cysts     removed x 4 before the oophorectomy  . POLYPECTOMY    . THYROIDECTOMY  1997    Family History  Problem Relation Age of Onset  . Hyperlipidemia Father   . Hypertension Father   . Heart failure Father 27  . Diabetes Sister   . Arthritis Paternal  Grandmother   . Kidney disease Paternal Grandmother   . Diabetes Paternal Grandmother   . Arthritis Paternal Grandfather   . Colon cancer Neg Hx   . Colon polyps Neg Hx   . Esophageal cancer Neg Hx   . Rectal cancer Neg Hx   . Stomach cancer Neg Hx   . Liver disease Neg Hx   . Inflammatory bowel disease Neg Hx     Allergies  Allergen Reactions  . Tramadol Itching    Current Outpatient Medications on File Prior to Visit  Medication Sig Dispense Refill  . amitriptyline (ELAVIL) 25 MG tablet Take 1 tablet (25 mg total) by mouth at bedtime. 90 tablet 1  . atorvastatin (LIPITOR) 20 MG tablet TAKE 1 TABLET DAILY 90 tablet 1  . empagliflozin (JARDIANCE) 10 MG TABS tablet Take 10 mg by mouth daily. 30 tablet 5  . escitalopram (LEXAPRO) 10 MG tablet Take 1 tablet (10 mg total) by mouth daily. 15 tablet 0  . glucose blood (ONETOUCH VERIO) test strip Use as instructed to check blood sugar one to two times a day.  DX  E11.9 200 each 1  . levothyroxine (SYNTHROID) 100 MCG tablet Take 1 tablet (100 mcg total) by mouth daily before breakfast. 90 tablet 1  . linaclotide (LINZESS) 145 MCG CAPS capsule Take 1 capsule (145 mcg total) by mouth daily before breakfast. 30 capsule 3  . metoCLOPramide (REGLAN) 5 MG tablet TAKE 1 TABLET BY MOUTH THREE TIMES DAILY BEFORE MEAL(S) 90 tablet 0  . omeprazole (PRILOSEC) 40 MG capsule TAKE 1 CAPSULE DAILY 90 capsule 1  . ondansetron (ZOFRAN) 4 MG tablet Take 1 tablet (4 mg total) by mouth every 8 (eight) hours as needed for nausea or vomiting. 30 tablet 2  . ONETOUCH DELICA LANCETS 34L MISC Use as instructed to check blood sugar one to two times a day.  DX  E11.9 200 each 1  . pioglitazone (ACTOS) 30 MG tablet Take 1 tablet by mouth once daily 30 tablet 5  . sitaGLIPtin (JANUVIA) 100 MG tablet Take 1 tablet (100 mg total) by mouth daily. 30 tablet 5   No current facility-administered medications on file prior to visit.     There were no vitals taken for this  visit.     Observations/Objective:   Gen: Awake, alert, no acute distress Resp:  Breathing is even and non-labored Psych: calm/pleasant demeanor Neuro: Alert and Oriented x 3, + facial symmetry, speech is clear.   Assessment and Plan:  1) left-sided chest wall tenderness- Pt has history of osteopenia. I am concerned about a left sided rib fracture. I have advised pt to complete a left sided rib x-ray for further evaluation. She is advised to switch to extra-strength tylenol prn and avoid heavy lifting/painful movements.   Follow Up Instructions:    I discussed the assessment and treatment plan with the patient. The patient was provided an opportunity to ask questions and all were answered. The patient agreed with the plan and demonstrated an understanding of the instructions.   The patient was advised to call back or seek an in-person evaluation if the symptoms worsen or if the condition fails to improve as anticipated.  Nance Pear, NP

## 2018-07-24 ENCOUNTER — Encounter: Payer: Self-pay | Admitting: Family

## 2018-08-27 ENCOUNTER — Other Ambulatory Visit: Payer: Self-pay

## 2018-09-04 ENCOUNTER — Other Ambulatory Visit: Payer: Self-pay

## 2018-09-04 MED ORDER — AMITRIPTYLINE HCL 25 MG PO TABS: 25.0000 mg | ORAL_TABLET | Freq: Every day | ORAL | 1 refills | Status: DC

## 2018-09-06 ENCOUNTER — Encounter: Payer: Self-pay | Admitting: Family

## 2018-09-09 MED ORDER — ATORVASTATIN CALCIUM 20 MG PO TABS: 20.0000 mg | ORAL_TABLET | Freq: Every day | ORAL | 1 refills | Status: DC

## 2018-09-09 MED ORDER — ESCITALOPRAM OXALATE 10 MG PO TABS: 10.0000 mg | ORAL_TABLET | Freq: Every day | ORAL | 1 refills | Status: DC

## 2018-10-22 ENCOUNTER — Encounter: Payer: Self-pay | Admitting: Family

## 2018-11-19 ENCOUNTER — Encounter: Payer: Self-pay | Admitting: Family

## 2018-11-19 NOTE — Telephone Encounter (Signed)
Patient scheduled to see Dr. Lorelei Pont tomorrow for virtual visit

## 2018-11-20 ENCOUNTER — Encounter: Payer: Self-pay | Admitting: Family Medicine

## 2018-11-20 ENCOUNTER — Ambulatory Visit (INDEPENDENT_AMBULATORY_CARE_PROVIDER_SITE_OTHER): Payer: BC Managed Care – PPO | Admitting: Family Medicine

## 2018-11-20 ENCOUNTER — Other Ambulatory Visit: Payer: Self-pay

## 2018-11-20 DIAGNOSIS — H9201 Otalgia, right ear: Secondary | ICD-10-CM | POA: Diagnosis not present

## 2018-11-20 DIAGNOSIS — R059 Cough, unspecified: Secondary | ICD-10-CM

## 2018-11-20 DIAGNOSIS — R05 Cough: Secondary | ICD-10-CM

## 2018-11-20 MED ORDER — AMOXICILLIN 500 MG PO CAPS: 1000.0000 mg | ORAL_CAPSULE | Freq: Two times a day (BID) | ORAL | 0 refills | Status: DC

## 2018-11-20 NOTE — Progress Notes (Signed)
[facility] [address], [place], [place] [address] [id number] [phone]  Date:  11/20/2018   Name:  Donna Anderson   DOB:  March 17, 1960   MRN:  [id number]  PCP:  Debbrah Alar, NP    Chief Complaint: No chief complaint on file.   History of Present Illness:  Donna Anderson is a 58 y.o. very pleasant female patient who presents with the following:  Virtual visit today for concern of illness Patient with history of diabetes, dyslipidemia, hypothyroidism, anxiety Primary patient of Melissa O'Sullivan-I have not seen her previously  Patient location is home, provider is at office Patient identity confirmed with 2 factors, she gives consent for virtual visit today  Lab Results  Component Value Date   HGBA1C 8.1 (H) 02/28/2018   She notes an earache for about 2 days, it hurts when she swallows so she is not sure where it is coming from The right ear hurts only-left is okay Mild cough this am and nasal congestion No fever noted- her temp is checked on a regular basis No vomiting or diarrhea  Nothing seen in her throat   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 Insomnia 09/21/2016  . Osteopenia 07/08/2016  . Diabetes type 2, uncontrolled ([place]) 06/21/2016  . Hypertriglyceridemia 06/21/2016  . Left hip pain 11/14/2015  . Fatty liver 07/30/2015  . Restless leg syndrome 09/22/2014  . Adnexal cyst 03/31/2013  . Allergic rhinitis 02/09/2013  . Nephrolithiasis 12/01/2012  . Anxiety 12/01/2012  . Hypothyroidism 12/01/2012    Past Medical History:  Diagnosis Date  . Allergy   . Anemia   . Anxiety   .  Aortic atherosclerosis ([place])   . Arthritis   . Depression   . Diabetes type 2, controlled ([place]) 06/21/2016  . Diverticulosis 2015   CT SCAN   . GERD (gastroesophageal reflux disease)   . Graves disease 1997  . Hearing loss   . Hepatic steatosis   . History of chicken pox   . History of migraine   . Hyperlipidemia   . Hypertriglyceridemia 06/21/2016  . Internal hemorrhoids   . Kidney stones   . Osteopenia 07/08/2016  . Osteoporosis    osteopenia  . Tubular adenoma of colon     Past Surgical History:  Procedure Laterality Date  . APPENDECTOMY  2001  . COLONOSCOPY    . ELBOW SURGERY Right 2016  . ENDOMETRIAL ABLATION  2003  . HIP FRACTURE SURGERY Right 2009   fell playing with dog  . labrum repair Right 2011  . NASAL SINUS SURGERY     1998  . OOPHORECTOMY Right 2001   due to large ovarian cyst  . ovarian cysts     removed x 4 before the oophorectomy  . POLYPECTOMY    . THYROIDECTOMY  1997    Social History   Tobacco Use  . Smoking status: Former Smoker    Years: 15.00    Quit date: 01/16/1999    Years since quitting: 19.8  . Smokeless tobacco: Never Used  Substance Use Topics  .  Alcohol use: Not Currently    Alcohol/week: 0.0 standard drinks    Comment: none in 2 years as of 05/07/18  . Drug use: No    Family History  Problem Relation Age of Onset  . Hyperlipidemia Father   . Hypertension Father   . Heart failure Father 13  . Diabetes Sister   . Arthritis Paternal Grandmother   . Kidney disease Paternal Grandmother   . Diabetes Paternal Grandmother   . Arthritis Paternal Grandfather   . Colon cancer Neg Hx   . Colon polyps Neg Hx   . Esophageal cancer Neg Hx   . Rectal cancer Neg Hx   . Stomach cancer Neg Hx   . Liver disease Neg Hx   . Inflammatory bowel disease Neg Hx     Allergies  Allergen Reactions  . Tramadol Itching    Medication list has been reviewed and updated.  Current Outpatient Medications on File Prior to Visit  Medication Sig  Dispense Refill  . amitriptyline (ELAVIL) 25 MG tablet Take 1 tablet (25 mg total) by mouth at bedtime. 90 tablet 1  . atorvastatin (LIPITOR) 20 MG tablet Take 1 tablet (20 mg total) by mouth daily. 90 tablet 1  . empagliflozin (JARDIANCE) 10 MG TABS tablet Take 10 mg by mouth daily. 30 tablet 5  . escitalopram (LEXAPRO) 10 MG tablet Take 1 tablet (10 mg total) by mouth daily. 90 tablet 1  . glucose blood (ONETOUCH VERIO) test strip Use as instructed to check blood sugar one to two times a day.  DX  E11.9 200 each 1  . JANUVIA 100 MG tablet Take 1 tablet by mouth once daily 30 tablet 5  . levothyroxine (SYNTHROID) 100 MCG tablet Take 1 tablet (100 mcg total) by mouth daily before breakfast. 90 tablet 1  . linaclotide (LINZESS) 145 MCG CAPS capsule Take 1 capsule (145 mcg total) by mouth daily before breakfast. 30 capsule 3  . metoCLOPramide (REGLAN) 5 MG tablet TAKE 1 TABLET BY MOUTH THREE TIMES DAILY BEFORE MEAL(S) 90 tablet 0  . omeprazole (PRILOSEC) 40 MG capsule TAKE 1 CAPSULE DAILY 90 capsule 1  . ondansetron (ZOFRAN) 4 MG tablet Take 1 tablet (4 mg total) by mouth every 8 (eight) hours as needed for nausea or vomiting. 30 tablet 2  . ONETOUCH DELICA LANCETS 99991111 MISC Use as instructed to check blood sugar one to two times a day.  DX  E11.9 200 each 1  . pioglitazone (ACTOS) 30 MG tablet Take 1 tablet by mouth once daily 30 tablet 5   No current facility-administered medications on file prior to visit.     Review of Systems:  As per HPI- otherwise negative.   Physical Examination: There were no vitals filed for this visit. There were no vitals filed for this visit. There is no height or weight on file to calculate BMI. Ideal Body Weight:     just checking her temp- no fever She does not check her BP at home   Patient surgery video.  She looks well, no cough, wheezing, shortness of breath is noted Assessment and Plan: Cough - Plan: Novel Coronavirus, NAA ([facility])  Earache on  right - Plan: amoxicillin (AMOXIL) 500 MG capsule  Virtual visit today for right-sided earache and also mild URI symptoms, consider COVID-19.  Called in amoxicillin for her to use for her earache and potential pharyngitis.  I ordered a COVID-19 test for her, she will be swabbed tomorrow morning.  Advised her to self  isolate until results return.  If not doing okay she is to seek help.  Assuming results are negative, if her ear continues to hurt next week we can see her in the office  Signed Lamar Blinks, MD

## 2018-11-21 ENCOUNTER — Other Ambulatory Visit: Payer: Self-pay

## 2018-11-21 DIAGNOSIS — Z20822 Contact with and (suspected) exposure to covid-19: Secondary | ICD-10-CM

## 2018-11-22 ENCOUNTER — Encounter: Payer: Self-pay | Admitting: Family Medicine

## 2018-11-22 LAB — NOVEL CORONAVIRUS, NAA: SARS-CoV-2, NAA: NOT DETECTED

## 2018-12-05 ENCOUNTER — Encounter: Payer: Self-pay | Admitting: Family

## 2018-12-05 ENCOUNTER — Other Ambulatory Visit: Payer: Self-pay

## 2018-12-05 MED ORDER — LEVOTHYROXINE SODIUM 100 MCG PO TABS: 100.0000 ug | ORAL_TABLET | Freq: Every day | ORAL | 0 refills | Status: DC

## 2018-12-05 MED ORDER — LEVOTHYROXINE SODIUM 100 MCG PO TABS: 100.0000 ug | ORAL_TABLET | Freq: Every day | ORAL | 1 refills | Status: DC

## 2018-12-12 ENCOUNTER — Other Ambulatory Visit: Payer: Self-pay | Admitting: Family

## 2019-01-08 ENCOUNTER — Encounter: Payer: Self-pay | Admitting: Family

## 2019-01-12 ENCOUNTER — Ambulatory Visit: Payer: BC Managed Care – PPO | Attending: Internal Medicine

## 2019-01-12 ENCOUNTER — Other Ambulatory Visit: Payer: Self-pay

## 2019-01-12 DIAGNOSIS — Z20822 Contact with and (suspected) exposure to covid-19: Secondary | ICD-10-CM

## 2019-01-12 DIAGNOSIS — Z20828 Contact with and (suspected) exposure to other viral communicable diseases: Secondary | ICD-10-CM | POA: Diagnosis not present

## 2019-01-14 ENCOUNTER — Encounter: Payer: Self-pay | Admitting: Family

## 2019-01-14 DIAGNOSIS — Z20822 Contact with and (suspected) exposure to covid-19: Secondary | ICD-10-CM

## 2019-01-14 LAB — NOVEL CORONAVIRUS, NAA: SARS-CoV-2, NAA: NOT DETECTED

## 2019-01-15 ENCOUNTER — Ambulatory Visit: Payer: BC Managed Care – PPO | Attending: Internal Medicine

## 2019-01-15 ENCOUNTER — Other Ambulatory Visit: Payer: Self-pay

## 2019-01-15 DIAGNOSIS — Z20822 Contact with and (suspected) exposure to covid-19: Secondary | ICD-10-CM

## 2019-01-15 DIAGNOSIS — Z20828 Contact with and (suspected) exposure to other viral communicable diseases: Secondary | ICD-10-CM | POA: Diagnosis not present

## 2019-01-17 LAB — NOVEL CORONAVIRUS, NAA: SARS-CoV-2, NAA: DETECTED — AB

## 2019-01-20 ENCOUNTER — Encounter: Payer: Self-pay | Admitting: Family

## 2019-01-21 ENCOUNTER — Encounter: Payer: Self-pay | Admitting: Family

## 2019-01-21 MED ORDER — LEVOTHYROXINE SODIUM 100 MCG PO TABS: 100.0000 ug | ORAL_TABLET | Freq: Every day | ORAL | 0 refills | Status: DC

## 2019-01-21 NOTE — Telephone Encounter (Signed)
Spoke with pt regarding synthroid. Patient was advised by pharmacy that prescription was placed on hold. Will resend to mail order pharmacy.

## 2019-01-22 ENCOUNTER — Other Ambulatory Visit: Payer: Self-pay

## 2019-01-22 MED ORDER — LEVOTHYROXINE SODIUM 100 MCG PO TABS: 100.0000 ug | ORAL_TABLET | Freq: Every day | ORAL | 1 refills | Status: DC

## 2019-01-25 ENCOUNTER — Encounter: Payer: Self-pay | Admitting: Family

## 2019-01-26 ENCOUNTER — Encounter (INDEPENDENT_AMBULATORY_CARE_PROVIDER_SITE_OTHER): Payer: Self-pay

## 2019-01-26 ENCOUNTER — Ambulatory Visit (INDEPENDENT_AMBULATORY_CARE_PROVIDER_SITE_OTHER): Payer: BC Managed Care – PPO | Admitting: Family Medicine

## 2019-01-26 VITALS — BP 118/80 | HR 90 | Temp 98.4°F

## 2019-01-26 DIAGNOSIS — R0789 Other chest pain: Secondary | ICD-10-CM

## 2019-01-26 DIAGNOSIS — U071 COVID-19: Secondary | ICD-10-CM

## 2019-01-26 DIAGNOSIS — R5383 Other fatigue: Secondary | ICD-10-CM | POA: Diagnosis not present

## 2019-01-26 DIAGNOSIS — J989 Respiratory disorder, unspecified: Secondary | ICD-10-CM

## 2019-01-26 DIAGNOSIS — R0602 Shortness of breath: Secondary | ICD-10-CM

## 2019-01-26 DIAGNOSIS — J0140 Acute pansinusitis, unspecified: Secondary | ICD-10-CM

## 2019-01-26 MED ORDER — PREDNISONE 20 MG PO TABS: 20.0000 mg | ORAL_TABLET | Freq: Every day | ORAL | 0 refills | Status: AC

## 2019-01-26 MED ORDER — ALBUTEROL SULFATE HFA 108 (90 BASE) MCG/ACT IN AERS: 2.0000 | INHALATION_SPRAY | RESPIRATORY_TRACT | 0 refills | Status: DC | PRN

## 2019-01-26 MED ORDER — CEFDINIR 300 MG PO CAPS: 300.0000 mg | ORAL_CAPSULE | Freq: Two times a day (BID) | ORAL | 0 refills | Status: DC

## 2019-01-26 NOTE — Patient Instructions (Signed)
Notify me via Mychart if your symptoms do not improve by  01/29/19 as I would like to see you back in the clinic if you have not gotten better. Start cefdinir 300 mg twice daily x10 days for management of sinusitis.  I have also prescribed prednisone 20 mg x 5 days to improve work of breathing.  Please monitor your blood sugars during the time as it is expected to slightly increase however please follow-up with primary care provider if blood sugars are above 200 consistently. Continue Tylenol every 4-6 hours as needed for headache.  Resume use of nasal spray once congestion improves.  If symptoms worsen please go immediately to the emergency department for further evaluation. Sinusitis, Adult Sinusitis is soreness and swelling (inflammation) of your sinuses. Sinuses are hollow spaces in the bones around your face. They are located:  Around your eyes.  In the middle of your forehead.  Behind your nose.  In your cheekbones. Your sinuses and nasal passages are lined with a fluid called mucus. Mucus drains out of your sinuses. Swelling can trap mucus in your sinuses. This lets germs (bacteria, virus, or fungus) grow, which leads to infection. Most of the time, this condition is caused by a virus. What are the causes? This condition is caused by:  Allergies.  Asthma.  Germs.  Things that block your nose or sinuses.  Growths in the nose (nasal polyps).  Chemicals or irritants in the air.  Fungus (rare). What increases the risk? You are more likely to develop this condition if:  You have a weak body defense system (immune system).  You do a lot of swimming or diving.  You use nasal sprays too much.  You smoke. What are the signs or symptoms? The main symptoms of this condition are pain and a feeling of pressure around the sinuses. Other symptoms include:  Stuffy nose (congestion).  Runny nose (drainage).  Swelling and warmth in the sinuses.  Headache.  Toothache.  A  cough that may get worse at night.  Mucus that collects in the throat or the back of the nose (postnasal drip).  Being unable to smell and taste.  Being very tired (fatigue).  A fever.  Sore throat.  Bad breath. How is this diagnosed? This condition is diagnosed based on:  Your symptoms.  Your medical history.  A physical exam.  Tests to find out if your condition is short-term (acute) or long-term (chronic). Your doctor may: ? Check your nose for growths (polyps). ? Check your sinuses using a tool that has a light (endoscope). ? Check for allergies or germs. ? Do imaging tests, such as an MRI or CT scan. How is this treated? Treatment for this condition depends on the cause and whether it is short-term or long-term.  If caused by a virus, your symptoms should go away on their own within 10 days. You may be given medicines to relieve symptoms. They include: ? Medicines that shrink swollen tissue in the nose. ? Medicines that treat allergies (antihistamines). ? A spray that treats swelling of the nostrils. ? Rinses that help get rid of thick mucus in your nose (nasal saline washes).  If caused by bacteria, your doctor may wait to see if you will get better without treatment. You may be given antibiotic medicine if you have: ? A very bad infection. ? A weak body defense system.  If caused by growths in the nose, you may need to have surgery. Follow these instructions at home: Medicines  Take, use, or apply over-the-counter and prescription medicines only as told by your doctor. These may include nasal sprays.  If you were prescribed an antibiotic medicine, take it as told by your doctor. Do not stop taking the antibiotic even if you start to feel better. Hydrate and humidify   Drink enough water to keep your pee (urine) pale yellow.  Use a cool mist humidifier to keep the humidity level in your home above 50%.  Breathe in steam for 10-15 minutes, 3-4 times a day,  or as told by your doctor. You can do this in the bathroom while a hot shower is running.  Try not to spend time in cool or dry air. Rest  Rest as much as you can.  Sleep with your head raised (elevated).  Make sure you get enough sleep each night. General instructions   Put a warm, moist washcloth on your face 3-4 times a day, or as often as told by your doctor. This will help with discomfort.  Wash your hands often with soap and water. If there is no soap and water, use hand sanitizer.  Do not smoke. Avoid being around people who are smoking (secondhand smoke).  Keep all follow-up visits as told by your doctor. This is important. Contact a doctor if:  You have a fever.  Your symptoms get worse.  Your symptoms do not get better within 10 days. Get help right away if:  You have a very bad headache.  You cannot stop throwing up (vomiting).  You have very bad pain or swelling around your face or eyes.  You have trouble seeing.  You feel confused.  Your neck is stiff.  You have trouble breathing. Summary  Sinusitis is swelling of your sinuses. Sinuses are hollow spaces in the bones around your face.  This condition is caused by tissues in your nose that become inflamed or swollen. This traps germs. These can lead to infection.  If you were prescribed an antibiotic medicine, take it as told by your doctor. Do not stop taking it even if you start to feel better.  Keep all follow-up visits as told by your doctor. This is important. This information is not intended to replace advice given to you by your health care provider. Make sure you discuss any questions you have with your health care provider. Document Revised: 06/03/2017 Document Reviewed: 06/03/2017 Elsevier Patient Education  [place] Can Do to Manage Your COVID-19 Symptoms at Home If you have possible or confirmed COVID-19: 1. Stay home from work and school. And stay  away from other public places. If you must go out, avoid using any kind of public transportation, ridesharing, or taxis. 2. Monitor your symptoms carefully. If your symptoms get worse, call your healthcare provider immediately. 3. Get rest and stay hydrated. 4. If you have a medical appointment, call the healthcare provider ahead of time and tell them that you have or may have COVID-19. 5. For medical emergencies, call 911 and notify the dispatch personnel that you have or may have COVID-19. 6. Cover your cough and sneezes with a tissue or use the inside of your elbow. 7. Wash your hands often with soap and water for at least 20 seconds or clean your hands with an alcohol-based hand sanitizer that contains at least 60% alcohol. 8. As much as possible, stay in a specific room and away from other people in your home. Also, you should  use a separate bathroom, if available. If you need to be around other people in or outside of the home, wear a mask. 9. Avoid sharing personal items with other people in your household, like dishes, towels, and bedding. 10. Clean all surfaces that are touched often, like counters, tabletops, and doorknobs. Use household cleaning sprays or wipes according to the label instructions. [email or web address] 07/16/2018 This information is not intended to replace advice given to you by your health care provider. Make sure you discuss any questions you have with your health care provider. Document Revised: 12/18/2018 Document Reviewed: 12/18/2018 Elsevier Patient Education  [place].

## 2019-01-26 NOTE — Progress Notes (Signed)
Patient ID: Donna Anderson, female    DOB: 1960/06/12, 59 y.o.   MRN: [id number]  PCP: Debbrah Alar, NP  Chief Complaint  Patient presents with  . covid 19 infection  . Shortness of Breath  . Chest Pain    with  breathing   . sinus congestion    Subjective:  HPI Donna Anderson, 59 y.o female presents to [facility] Respiratory clinic today for further evaluation of COVID-19 related symptoms.  Diagnosed with COVID-19 on 01/14/19 and was symptomatic at time of diagnosis. Current symptoms include dyspnea, persistent cough, chest tightness, facial pressure/headace similar to "sinusitis ",  poor appetite, palpitations, headache, dizziness, body-aches, headache, nausea, vomiting, or  abdominal "sick stomach". Patient was previously following gastroenterology for GI issues prior to diagnosis. Pulse ox as averaged >95%  Patient has managed symptoms with the following: Mucinex. PCP started patient on Azithromycin Able to tolerate fluids and appetite remains below baseline.  High Risk Complications for [id number] include: Atherosclerotic disease and Diabetes.  Patient previously admitted for complications of XX123456: None   Social History   Socioeconomic History  . Marital status: Married    Spouse name: Not on file  . Number of children: Not on file  . Years of education: Not on file  . Highest education level: Not on file  Occupational History  . Not on file  Tobacco Use  . Smoking status: Former Smoker    Years: 15.00    Quit date: 01/16/1999    Years since quitting: 20.0  . Smokeless tobacco: Never Used  Substance and Sexual Activity  . Alcohol use: Not Currently    Alcohol/week: 0.0 standard drinks    Comment: none in 2 years as of 05/07/18  . Drug use: No  . Sexual activity: Yes    Birth control/protection: None  Other Topics Concern  . Not on file  Social History Narrative   Married (second marriage) has one son in [place]   Worked in Psychologist, educational   Working at a  [employer] as a Radiation protection practitioner   Enjoys exercising, hiking, kayaking.    Completed 12th grade   1 dog, 1 cat   Was raised by her grandmother.    Social Determinants of Health   Financial Resource Strain:   . Difficulty of Paying Living Expenses: Not on file  Food Insecurity:   . Worried About Charity fundraiser in the Last Year: Not on file  . Ran Out of Food in the Last Year: Not on file  Transportation Needs:   . Lack of Transportation (Medical): Not on file  . Lack of Transportation (Non-Medical): Not on file  Physical Activity:   . Days of Exercise per Week: Not on file  . Minutes of Exercise per Session: Not on file  Stress:   . Feeling of Stress : Not on file  Social Connections:   . Frequency of Communication with Friends and Family: Not on file  . Frequency of Social Gatherings with Friends and Family: Not on file  . Attends Religious Services: Not on file  . Active Member of Clubs or Organizations: Not on file  . Attends Archivist Meetings: Not on file  . Marital Status: Not on file  Intimate Partner Violence:   . Fear of Current or Ex-Partner: Not on file  . Emotionally Abused: Not on file  . Physically Abused: Not on file  . Sexually Abused: Not on file    Family History  Problem Relation Age of Onset  .  Hyperlipidemia Father   . Hypertension Father   . Heart failure Father 27  . Diabetes Sister   . Arthritis Paternal Grandmother   . Kidney disease Paternal Grandmother   . Diabetes Paternal Grandmother   . Arthritis Paternal Grandfather   . Colon cancer Neg Hx   . Colon polyps Neg Hx   . Esophageal cancer Neg Hx   . Rectal cancer Neg Hx   . Stomach cancer Neg Hx   . Liver disease Neg Hx   . Inflammatory bowel disease Neg Hx    Review of Systems Pertinent negatives listed in HPI Allergies  Allergen Reactions  . Tramadol Itching    Prior to Admission medications   Medication Sig Start Date End Date Taking? Authorizing  Provider  amitriptyline (ELAVIL) 25 MG tablet Take 1 tablet (25 mg total) by mouth at bedtime. 09/04/18   Debbrah Alar, NP  atorvastatin (LIPITOR) 20 MG tablet Take 1 tablet (20 mg total) by mouth daily. 09/09/18   Debbrah Alar, NP  empagliflozin (JARDIANCE) 10 MG TABS tablet Take 10 mg by mouth daily. 02/28/18   Debbrah Alar, NP  escitalopram (LEXAPRO) 10 MG tablet Take 1 tablet (10 mg total) by mouth daily. 09/09/18   Debbrah Alar, NP  glucose blood (ONETOUCH VERIO) test strip Use as instructed to check blood sugar one to two times a day.  DX  E11.9 09/17/17   Debbrah Alar, NP  JANUVIA 100 MG tablet Take 1 tablet by mouth once daily 07/23/18   Debbrah Alar, NP  levothyroxine (SYNTHROID) 100 MCG tablet Take 1 tablet (100 mcg total) by mouth daily before breakfast. 01/22/19   Debbrah Alar, NP  linaclotide [facility]) 145 MCG CAPS capsule Take 1 capsule (145 mcg total) by mouth daily before breakfast. 06/18/18   Debbrah Alar, NP  metoCLOPramide (REGLAN) 5 MG tablet TAKE 1 TABLET BY MOUTH THREE TIMES DAILY BEFORE MEAL(S) 07/11/18   Pyrtle, Lajuan Lines, MD  omeprazole (PRILOSEC) 40 MG capsule TAKE 1 CAPSULE DAILY 12/14/18   Debbrah Alar, NP  [facility] DELICA LANCETS [id number] MISC Use as instructed to check blood sugar one to two times a day.  DX  E11.9 01/01/18   Debbrah Alar, NP  pioglitazone (ACTOS) 30 MG tablet Take 1 tablet by mouth once daily 06/13/18   Debbrah Alar, NP  omeprazole (PRILOSEC) 40 MG capsule TAKE 1 CAPSULE DAILY 07/15/18   Debbrah Alar, NP    Past Medical, Surgical Family and Social History reviewed and updated.    Objective:   Today's Vitals   01/26/19 1838  BP: 118/80  Pulse: 90  Temp: 98.4 F (36.9 C)  SpO2: 97%    BP Readings from Last 3 Encounters:  01/26/19 118/80  03/14/18 116/74  02/28/18 128/89    Physical Exam Constitutional:      Appearance: She is ill-appearing.  HENT:     Head: Normocephalic.      Nose: Congestion present.     Right Turbinates: Enlarged.     Left Turbinates: Enlarged.     Right Sinus: Maxillary sinus tenderness and frontal sinus tenderness present.     Left Sinus: Maxillary sinus tenderness and frontal sinus tenderness present.     Mouth/Throat:     Lips: Pink.     Mouth: Mucous membranes are moist.  Cardiovascular:     Rate and Rhythm: Normal rate and regular rhythm.  Pulmonary:     Breath sounds: Decreased air movement present. No wheezing, rhonchi or rales.     Comments: Persistent  rhonchorus-type cough noted during exam  Chest:     Chest wall: Tenderness present.  Musculoskeletal:     Cervical back: Normal range of motion.  Lymphadenopathy:     Cervical: No cervical adenopathy.  Neurological:     Mental Status: She is alert.      No results found for: POCGLU  Lab Results  Component Value Date   HGBA1C 8.1 (H) 02/28/2018    Assessment & Plan:  1. COVID-19 virus infection,  -High-risk for complication associated with infection given underlying diabetes and atherosclerotic disease  -Active COVID-19 infection w/ symptoms -Continue quarantine -Self monitor symptoms -My Chart COVID-19 monitoring system activated -Advised ER if symptoms worsen.  2. Chest tightness -Likely secondary to bronchial changes and possible early onset bronchitis. Pt is a former smoker. -Will Albuterol Inhaler provided in clinic today. Use as prescribed for bronchospasms, wheezing, and shortness of breath. Also adding prednisone (1/2 of respiratory dose due to diabetes) 20 mg once daily with breakfast x 5 days. Recommended routinely monitoring blood sugar and notify PCP if readings are persistently > 200.   3. Acute non-recurrent pansinusitis -Pt taking Azithromycin, given sinusitis symptoms and associated presumptive bronchitis, will d/c Azithromycin and start Cefdinir better antimicrobial coverage for both sinusitis, presumptive bronchitis and covers CAP.   4.  Fatigue, unspecified type, rest and engage in physical activity as tolerated.   5. Respiratory illness See #1-#3 plan details   6. SOB (shortness of breath) -See # 2 and 3 plan details.   Meds ordered this encounter  Medications  . predniSONE (DELTASONE) 20 MG tablet    Sig: Take 1 tablet (20 mg total) by mouth daily with breakfast for 5 days.    Dispense:  5 tablet    Refill:  0  . albuterol (VENTOLIN HFA) 108 (90 Base) MCG/ACT inhaler    Sig: Inhale 2 puffs into the lungs every 4 (four) hours as needed for wheezing or shortness of breath (cough, shortness of breath or wheezing.).    Dispense:  18 g    Refill:  0  . DISCONTD: cefdinir (OMNICEF) 300 MG capsule    Sig: Take 1 capsule (300 mg total) by mouth 2 (two) times daily for 10 days.    Dispense:  20 capsule    Refill:  0     Molli Barrows, FNP-C [facility], PRN Provider  [facility] [place] [facility] Phone: [phone] Clinic Fax: [phone] Clinic Hours: 5:30 pm -7:30 pm (Monday, Wednesday, and Friday)

## 2019-01-26 NOTE — Telephone Encounter (Signed)
Please contact pt and schedule her an appointment in  The respiratory clinic this afternoon.

## 2019-01-27 ENCOUNTER — Encounter (INDEPENDENT_AMBULATORY_CARE_PROVIDER_SITE_OTHER): Payer: Self-pay

## 2019-01-27 ENCOUNTER — Other Ambulatory Visit: Payer: Self-pay | Admitting: Family Medicine

## 2019-01-27 ENCOUNTER — Telehealth: Payer: Self-pay | Admitting: Family

## 2019-01-27 NOTE — Telephone Encounter (Signed)
Pt states she was prescribed prednisone and an antibiotic today for sinusitis. Reports abdominal pain, onset this evening . States like cramping, mid abdomen, "Spreads out like a web." Also reports nausea, no vomiting. States took meds with a banana this AM at 1030, has eaten very little since "Only a  piece of cake." States is staying hydrated. Advised to hold med, next dose due in AM, until hears from practice. Advised ED if abdominal pain worsens, becomes constant, vomiting occurs. Pt verbalizes understanding.  Please advise: [phone]

## 2019-01-28 ENCOUNTER — Telehealth: Payer: Self-pay

## 2019-01-28 ENCOUNTER — Encounter: Payer: Self-pay | Admitting: Family

## 2019-01-28 ENCOUNTER — Encounter (INDEPENDENT_AMBULATORY_CARE_PROVIDER_SITE_OTHER): Payer: Self-pay

## 2019-01-28 MED ORDER — AZITHROMYCIN 250 MG PO TABS: ORAL_TABLET | ORAL | 0 refills | Status: DC

## 2019-01-28 MED ORDER — ONDANSETRON HCL 4 MG PO TABS: 4.0000 mg | ORAL_TABLET | Freq: Three times a day (TID) | ORAL | 0 refills | Status: DC | PRN

## 2019-01-28 MED ORDER — GLIPIZIDE 5 MG PO TABS: 5.0000 mg | ORAL_TABLET | Freq: Two times a day (BID) | ORAL | 0 refills | Status: DC

## 2019-01-28 NOTE — Telephone Encounter (Signed)
Patient states that she vomited 3 times last night.Patient was abel to eat some toast. Patient has appointment with her provider today. Patient advised on protocol as follows:    If appetite becomes worse: encourage patient to drink fluids as tolerated, work their way up to bland solid food such as crackers, pretzels, soup, bread or applesauce and boiled starches.   If patient is unable to tolerate any foods or liquids, notify PCP.   IF PATIENT DEVELOPS SEVERE VOMITING (MORE THAN 6 TIMES A DAY AND OR >8 HOURS) AND/OR SEVERE ABDOMINAL PAIN ADVISE PATIENT TO CALL 911 AND SEEK TREATMENT IN ED

## 2019-01-28 NOTE — Telephone Encounter (Signed)
Please advise pt to d/c cefdinir, begin zpak instead. Rx is sent for zofran as needed for nausea. Let me know if symptoms do not improve with these changes. Go to ER if severe/worsening symptoms.

## 2019-01-28 NOTE — Telephone Encounter (Signed)
Patient advised to pick up new antibiotic today and to take zofran as needed for nausea. She understands instructions and that she will need to go to the ed if worsening symptoms.

## 2019-01-28 NOTE — Addendum Note (Signed)
Addended by: Debbrah Alar on: 01/28/2019 08:11 AM   Modules accepted: Orders

## 2019-01-29 ENCOUNTER — Encounter (INDEPENDENT_AMBULATORY_CARE_PROVIDER_SITE_OTHER): Payer: Self-pay

## 2019-01-30 ENCOUNTER — Encounter (INDEPENDENT_AMBULATORY_CARE_PROVIDER_SITE_OTHER): Payer: Self-pay

## 2019-01-30 ENCOUNTER — Telehealth: Payer: Self-pay

## 2019-01-30 ENCOUNTER — Encounter: Payer: Self-pay | Admitting: Family

## 2019-01-30 NOTE — Telephone Encounter (Signed)
Patient states that she doesn't have appetite and that she is forcing herself to eat. Patient is currently on a antibiotic and will follow up with her pcp. Patient advise as follows:    If appetite becomes worse: encourage patient to drink fluids as tolerated, work their way up to bland solid food such as crackers, pretzels, soup, bread or applesauce and boiled starches.   If patient is unable to tolerate any foods or liquids, notify PCP.   IF PATIENT DEVELOPS SEVERE VOMITING (MORE THAN 6 TIMES A DAY AND OR >8 HOURS) AND/OR SEVERE ABDOMINAL PAIN ADVISE PATIENT TO CALL 911 AND SEEK TREATMENT IN ED

## 2019-01-31 ENCOUNTER — Encounter (INDEPENDENT_AMBULATORY_CARE_PROVIDER_SITE_OTHER): Payer: Self-pay

## 2019-02-01 ENCOUNTER — Encounter (INDEPENDENT_AMBULATORY_CARE_PROVIDER_SITE_OTHER): Payer: Self-pay

## 2019-02-02 ENCOUNTER — Encounter (INDEPENDENT_AMBULATORY_CARE_PROVIDER_SITE_OTHER): Payer: Self-pay

## 2019-02-03 ENCOUNTER — Telehealth: Payer: Self-pay

## 2019-02-03 ENCOUNTER — Encounter (INDEPENDENT_AMBULATORY_CARE_PROVIDER_SITE_OTHER): Payer: Self-pay

## 2019-02-03 NOTE — Telephone Encounter (Signed)
PA initiated via Covermymeds; KEY: [id number]. PA denied. Awaiting denial information.   PA Case: [id number], Status: Denied. Notification: Completed.

## 2019-02-05 ENCOUNTER — Encounter: Payer: Self-pay | Admitting: Family

## 2019-02-05 ENCOUNTER — Encounter (INDEPENDENT_AMBULATORY_CARE_PROVIDER_SITE_OTHER): Payer: Self-pay

## 2019-02-05 NOTE — Telephone Encounter (Signed)
That should be fine. thanks 

## 2019-02-06 ENCOUNTER — Encounter (INDEPENDENT_AMBULATORY_CARE_PROVIDER_SITE_OTHER): Payer: Self-pay

## 2019-02-07 ENCOUNTER — Encounter (INDEPENDENT_AMBULATORY_CARE_PROVIDER_SITE_OTHER): Payer: Self-pay

## 2019-02-08 ENCOUNTER — Encounter (INDEPENDENT_AMBULATORY_CARE_PROVIDER_SITE_OTHER): Payer: Self-pay

## 2019-02-26 ENCOUNTER — Other Ambulatory Visit: Payer: Self-pay | Admitting: Family

## 2019-02-27 ENCOUNTER — Other Ambulatory Visit: Payer: Self-pay | Admitting: Family

## 2019-03-12 ENCOUNTER — Encounter: Payer: Self-pay | Admitting: Family

## 2019-03-18 ENCOUNTER — Other Ambulatory Visit: Payer: Self-pay | Admitting: Family

## 2019-03-23 ENCOUNTER — Encounter: Payer: Self-pay | Admitting: Family

## 2019-03-27 ENCOUNTER — Ambulatory Visit: Payer: BC Managed Care – PPO | Admitting: Family

## 2019-04-02 ENCOUNTER — Other Ambulatory Visit: Payer: Self-pay

## 2019-04-03 ENCOUNTER — Encounter: Payer: Self-pay | Admitting: Family

## 2019-04-03 ENCOUNTER — Ambulatory Visit: Payer: BC Managed Care – PPO | Admitting: Family

## 2019-04-03 ENCOUNTER — Other Ambulatory Visit: Payer: Self-pay

## 2019-04-03 VITALS — BP 125/86 | HR 87 | Temp 96.7°F | Resp 16 | Ht 63.0 in | Wt 135.4 lb

## 2019-04-03 DIAGNOSIS — E1165 Type 2 diabetes mellitus with hyperglycemia: Secondary | ICD-10-CM | POA: Diagnosis not present

## 2019-04-03 DIAGNOSIS — Z5181 Encounter for therapeutic drug level monitoring: Secondary | ICD-10-CM | POA: Diagnosis not present

## 2019-04-03 DIAGNOSIS — U071 COVID-19: Secondary | ICD-10-CM

## 2019-04-03 DIAGNOSIS — Z0000 Encounter for general adult medical examination without abnormal findings: Secondary | ICD-10-CM

## 2019-04-03 DIAGNOSIS — M8580 Other specified disorders of bone density and structure, unspecified site: Secondary | ICD-10-CM

## 2019-04-03 DIAGNOSIS — G2581 Restless legs syndrome: Secondary | ICD-10-CM | POA: Diagnosis not present

## 2019-04-03 LAB — LIPID PANEL
Cholesterol: 151 mg/dL (ref 0–200)
HDL: 38 mg/dL — ABNORMAL LOW (ref >39.00)
LDL Cholesterol: 76 mg/dL (ref 0–99)
NonHDL: 113.45
Total CHOL/HDL Ratio: 4
Triglycerides: 186 mg/dL — ABNORMAL HIGH (ref 0.0–149.0)
VLDL: 37.2 mg/dL (ref 0.0–40.0)

## 2019-04-03 LAB — COMPREHENSIVE METABOLIC PANEL
ALT: 33 U/L (ref 0–35)
AST: 31 U/L (ref 0–37)
Albumin: 4.4 g/dL (ref 3.5–5.2)
Alkaline Phosphatase: 113 U/L (ref 39–117)
BUN: 12 mg/dL (ref 6–23)
CO2: 32 mEq/L (ref 19–32)
Calcium: 9.6 mg/dL (ref 8.4–10.5)
Chloride: 100 mEq/L (ref 96–112)
Creatinine, Ser: 0.79 mg/dL (ref 0.40–1.20)
GFR: 74.86 mL/min (ref >60.00)
Glucose, Bld: 125 mg/dL — ABNORMAL HIGH (ref 70–99)
Potassium: 4.6 mEq/L (ref 3.5–5.1)
Sodium: 138 mEq/L (ref 135–145)
Total Bilirubin: 0.4 mg/dL (ref 0.2–1.2)
Total Protein: 6.9 g/dL (ref 6.0–8.3)

## 2019-04-03 LAB — MICROALBUMIN / CREATININE URINE RATIO
Creatinine,U: 48.8 mg/dL
Microalb Creat Ratio: 3.7 mg/g (ref 0.0–30.0)
Microalb, Ur: 1.8 mg/dL (ref 0.0–1.9)

## 2019-04-03 LAB — HEMOGLOBIN A1C: Hgb A1c MFr Bld: 7.2 % — ABNORMAL HIGH (ref 4.6–6.5)

## 2019-04-03 MED ORDER — ONETOUCH VERIO VI STRP: ORAL_STRIP | 1 refills | Status: DC

## 2019-04-03 MED ORDER — TERBINAFINE HCL 250 MG PO TABS: 250.0000 mg | ORAL_TABLET | Freq: Every day | ORAL | 0 refills | Status: DC

## 2019-04-03 MED ORDER — ONETOUCH DELICA LANCETS 33G MISC: 1 refills | Status: DC

## 2019-04-03 MED ORDER — ROPINIROLE HCL 0.5 MG PO TABS: 0.5000 mg | ORAL_TABLET | Freq: Every day | ORAL | 5 refills | Status: DC

## 2019-04-03 NOTE — Patient Instructions (Signed)
Please complete lab work prior to leaving. Begin lamisil once daily for toenail. We will need repeat lab work in 1 month so we can continue the second and third month of treatment. Begin requip at bedtime for restless legs.

## 2019-04-03 NOTE — Progress Notes (Signed)
Subjective:    Patient ID: Donna Anderson, female    DOB: Dec 04, 1960, 59 y.o.   MRN: [id number]  HPI  Patient is a 59 yr old female who presents today for follow up.   Hypothyroid- maintained on synthroid 163mcg.  Lab Results  Component Value Date   TSH 4.28 02/28/2018    Hyperlipidemia- maintained on atorvastatin. Lab Results  Component Value Date   CHOL 153 03/14/2018   HDL 40.20 03/14/2018   LDLDIRECT 95.0 03/14/2018   TRIG 225.0 (H) 03/14/2018   CHOLHDL 4 03/14/2018    DM2- current meds include jardiance, actos, januvia. Reports that her sugars 150-170. She  Lab Results  Component Value Date   HGBA1C 8.1 (H) 02/28/2018   HGBA1C 8.7 (H) 06/28/2017   HGBA1C 7.5 (H) 09/21/2016   Lab Results  Component Value Date   MICROALBUR 0.9 06/28/2017   CREATININE 0.79 03/14/2018   Anxiety- maintained on lexapro.   Osteopenia-last bone density 2018.   Covid-19- was diagnosed 12/31. Still winded with exertion and can't smell.   She reports some splitting of the toenail on her left great toe. Has not had any trauma to the toe.   RLS- reports that her symptoms have been "bad recently."  Review of Systems    see HPI  Past Medical History:  Diagnosis Date  . Allergy   . Anemia   . Anxiety   . Aortic atherosclerosis ([place])   . Arthritis   . Depression   . Diabetes type 2, controlled ([place]) 06/21/2016  . Diverticulosis 2015   CT SCAN   . GERD (gastroesophageal reflux disease)   . Graves disease 1997  . Hearing loss   . Hepatic steatosis   . History of chicken pox   . History of migraine   . Hyperlipidemia   . Hypertriglyceridemia 06/21/2016  . Internal hemorrhoids   . Kidney stones   . Osteopenia 07/08/2016  . Osteoporosis    osteopenia  . Tubular adenoma of colon      Social History   Socioeconomic History  . Marital status: Married    Spouse name: Not on file  . Number of children: Not on file  . Years of education: Not on file  . Highest education  level: Not on file  Occupational History  . Not on file  Tobacco Use  . Smoking status: Former Smoker    Years: 15.00    Quit date: 01/16/1999    Years since quitting: 20.2  . Smokeless tobacco: Never Used  Substance and Sexual Activity  . Alcohol use: Not Currently    Alcohol/week: 0.0 standard drinks    Comment: none in 2 years as of 05/07/18  . Drug use: No  . Sexual activity: Yes    Birth control/protection: None  Other Topics Concern  . 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    Completed 12th grade   1 dog, 1 cat   Was raised by her grandmother.    Social Determinants of Health   Financial Resource Strain:   . Difficulty of Paying Living Expenses:   Food Insecurity:   . Worried About Charity fundraiser in the Last Year:   . Arboriculturist in the Last Year:   Transportation Needs:   . Film/video editor (Medical):   Marland Kitchen  Lack of Transportation (Non-Medical):   Physical Activity:   . Days of Exercise per Week:   . Minutes of Exercise per Session:   Stress:   . Feeling of Stress :   Social Connections:   . Frequency of Communication with Friends and Family:   . Frequency of Social Gatherings with Friends and Family:   . Attends Religious Services:   . Active Member of Clubs or Organizations:   . Attends Archivist Meetings:   Marland Kitchen Marital Status:   Intimate Partner Violence:   . Fear of Current or Ex-Partner:   . Emotionally Abused:   Marland Kitchen Physically Abused:   . Sexually Abused:     Past Surgical History:  Procedure Laterality Date  . APPENDECTOMY  2001  . COLONOSCOPY    . ELBOW SURGERY Right 2016  . ENDOMETRIAL ABLATION  2003  . HIP FRACTURE SURGERY Right 2009   fell playing with dog  . labrum repair Right 2011  . NASAL SINUS SURGERY     1998  . OOPHORECTOMY Right 2001   due to  large ovarian cyst  . ovarian cysts     removed x 4 before the oophorectomy  . POLYPECTOMY    . THYROIDECTOMY  1997    Family History  Problem Relation Age of Onset  . Hyperlipidemia Father   . Hypertension Father   . Heart failure Father 73  . Diabetes Sister   . Arthritis Paternal Grandmother   . Kidney disease Paternal Grandmother   . Diabetes Paternal Grandmother   . Arthritis Paternal Grandfather   . Colon cancer Neg Hx   . Colon polyps Neg Hx   . Esophageal cancer Neg Hx   . Rectal cancer Neg Hx   . Stomach cancer Neg Hx   . Liver disease Neg Hx   . Inflammatory bowel disease Neg Hx     Allergies  Allergen Reactions  . Tramadol Itching    Current Outpatient Medications on File Prior to Visit  Medication Sig Dispense Refill  . amitriptyline (ELAVIL) 25 MG tablet TAKE 1 TABLET AT BEDTIME 90 tablet 1  . atorvastatin (LIPITOR) 20 MG tablet TAKE 1 TABLET DAILY 90 tablet 1  . empagliflozin (JARDIANCE) 10 MG TABS tablet Take 10 mg by mouth daily. 30 tablet 5  . escitalopram (LEXAPRO) 10 MG tablet TAKE 1 TABLET DAILY 90 tablet 1  . glucose blood (ONETOUCH VERIO) test strip Use as instructed to check blood sugar one to two times a day.  DX  E11.9 200 each 1  . JANUVIA 100 MG tablet Take 1 tablet by mouth once daily 30 tablet 2  . omeprazole (PRILOSEC) 40 MG capsule TAKE 1 CAPSULE DAILY 90 capsule 1  . ONETOUCH DELICA LANCETS 99991111 MISC Use as instructed to check blood sugar one to two times a day.  DX  E11.9 200 each 1  . pioglitazone (ACTOS) 30 MG tablet Take 1 tablet by mouth once daily 30 tablet 5  . SYNTHROID 100 MCG tablet TAKE 1 TABLET DAILY BEFORE BREAKFAST 90 tablet 0  . linaclotide (LINZESS) 145 MCG CAPS capsule Take 1 capsule (145 mcg total) by mouth daily before breakfast. 30 capsule 3  . [DISCONTINUED] omeprazole (PRILOSEC) 40 MG capsule TAKE 1 CAPSULE DAILY 90 capsule 1   No current facility-administered medications on file prior to visit.    BP 125/86 (BP  Location: Right Arm, Patient Position: Sitting, Cuff Size: Small)   Pulse 87   Temp (!) 96.7 F (35.9  C) (Temporal)   Resp 16   Ht 5\' 3"  (1.6 m)   Wt 135 lb 6.4 oz (61.4 kg)   SpO2 98%   BMI 23.99 kg/m    Objective:   Physical Exam Constitutional:      Appearance: She is well-developed.  Neck:     Thyroid: No thyromegaly.  Cardiovascular:     Rate and Rhythm: Normal rate and regular rhythm.     Heart sounds: Normal heart sounds. No murmur.  Pulmonary:     Effort: Pulmonary effort is normal. No respiratory distress.     Breath sounds: Normal breath sounds. No wheezing.  Musculoskeletal:     Cervical back: Neck supple.  Skin:    General: Skin is warm and dry.     Comments: Some discolation of the left great toenail medially with splitting of nail.   Neurological:     Mental Status: She is alert and oriented to person, place, and time.  Psychiatric:        Behavior: Behavior normal.        Thought Content: Thought content normal.        Judgment: Judgment normal.           Assessment & Plan:  RLS- uncontrolled. Trial of requip.  Onychomycosis- trial of lamisil, repeat lfts in 4 weeks.  DM2- uncontrolled. Obtain follow up A1C. Continue current meds for now.  Anxiety- stable on lexapro, continue same.   Osteopenia- due for follow up bone density.  Will order.   Post covid syndrome- slowly improving, monitor.   This visit occurred during the SARS-CoV-2 public health emergency.  Safety protocols were in place, including screening questions prior to the visit, additional usage of staff PPE, and extensive cleaning of exam room while observing appropriate contact time as indicated for disinfecting solutions.

## 2019-04-04 ENCOUNTER — Encounter: Payer: Self-pay | Admitting: Family

## 2019-04-05 NOTE — Telephone Encounter (Signed)
Did you mean to order a TSH for this patient?

## 2019-04-06 ENCOUNTER — Other Ambulatory Visit: Payer: BC Managed Care – PPO

## 2019-04-06 ENCOUNTER — Telehealth: Payer: Self-pay

## 2019-04-06 ENCOUNTER — Other Ambulatory Visit: Payer: Self-pay | Admitting: Family

## 2019-04-06 DIAGNOSIS — E039 Hypothyroidism, unspecified: Secondary | ICD-10-CM

## 2019-04-06 LAB — TSH: TSH: 3.05 mIU/L (ref 0.40–4.50)

## 2019-04-06 MED ORDER — PIOGLITAZONE HCL 30 MG PO TABS: 30.0000 mg | ORAL_TABLET | Freq: Every day | ORAL | 1 refills | Status: DC

## 2019-04-06 MED ORDER — ONETOUCH DELICA LANCETS 33G MISC: 1 refills | Status: DC

## 2019-04-06 MED ORDER — LEVOTHYROXINE SODIUM 100 MCG PO TABS: 100.0000 ug | ORAL_TABLET | Freq: Every day | ORAL | 1 refills | Status: DC

## 2019-04-06 MED ORDER — OMEPRAZOLE 40 MG PO CPDR: 40.0000 mg | DELAYED_RELEASE_CAPSULE | Freq: Every day | ORAL | 1 refills | Status: DC

## 2019-04-06 MED ORDER — AMITRIPTYLINE HCL 25 MG PO TABS: 25.0000 mg | ORAL_TABLET | Freq: Every day | ORAL | 1 refills | Status: DC

## 2019-04-06 MED ORDER — ATORVASTATIN CALCIUM 20 MG PO TABS: 20.0000 mg | ORAL_TABLET | Freq: Every day | ORAL | 1 refills | Status: DC

## 2019-04-06 MED ORDER — JARDIANCE 10 MG PO TABS: 10.0000 mg | ORAL_TABLET | Freq: Every day | ORAL | 5 refills | Status: DC

## 2019-04-06 MED ORDER — ONETOUCH VERIO VI STRP: ORAL_STRIP | 1 refills | Status: DC

## 2019-04-06 MED ORDER — SITAGLIPTIN PHOSPHATE 100 MG PO TABS: 100.0000 mg | ORAL_TABLET | Freq: Every day | ORAL | 1 refills | Status: DC

## 2019-04-06 MED ORDER — ESCITALOPRAM OXALATE 10 MG PO TABS: 10.0000 mg | ORAL_TABLET | Freq: Every day | ORAL | 1 refills | Status: DC

## 2019-04-06 NOTE — Progress Notes (Unsigned)
ts

## 2019-04-06 NOTE — Telephone Encounter (Signed)
PA initiated via Covermymeds; KEY: [id number]. PA approved.   PA Case: [id number], Status: Approved, Coverage Starts on: 04/06/2019 12:00:00 AM, Coverage Ends on: 04/05/2020 12:00:00 AM.

## 2019-04-07 ENCOUNTER — Encounter: Payer: Self-pay | Admitting: Family

## 2019-04-25 LAB — HM DIABETES EYE EXAM

## 2019-05-01 ENCOUNTER — Encounter: Payer: Self-pay | Admitting: Family

## 2019-05-01 MED ORDER — AMOXICILLIN 500 MG PO CAPS: 500.0000 mg | ORAL_CAPSULE | Freq: Three times a day (TID) | ORAL | 0 refills | Status: DC

## 2019-05-04 ENCOUNTER — Encounter: Payer: Self-pay | Admitting: Family

## 2019-05-04 ENCOUNTER — Other Ambulatory Visit: Payer: BC Managed Care – PPO

## 2019-05-08 ENCOUNTER — Other Ambulatory Visit (INDEPENDENT_AMBULATORY_CARE_PROVIDER_SITE_OTHER): Payer: BC Managed Care – PPO

## 2019-05-08 ENCOUNTER — Other Ambulatory Visit: Payer: Self-pay

## 2019-05-08 DIAGNOSIS — Z5181 Encounter for therapeutic drug level monitoring: Secondary | ICD-10-CM

## 2019-05-08 LAB — HEPATIC FUNCTION PANEL
ALT: 20 U/L (ref 0–35)
AST: 19 U/L (ref 0–37)
Albumin: 4.4 g/dL (ref 3.5–5.2)
Alkaline Phosphatase: 100 U/L (ref 39–117)
Bilirubin, Direct: 0.1 mg/dL (ref 0.0–0.3)
Total Bilirubin: 0.3 mg/dL (ref 0.2–1.2)
Total Protein: 6.6 g/dL (ref 6.0–8.3)

## 2019-05-11 ENCOUNTER — Telehealth: Payer: Self-pay | Admitting: Family

## 2019-05-11 MED ORDER — TERBINAFINE HCL 250 MG PO TABS: 250.0000 mg | ORAL_TABLET | Freq: Every day | ORAL | 1 refills | Status: DC

## 2019-05-11 NOTE — Telephone Encounter (Signed)
Patient notified

## 2019-05-11 NOTE — Telephone Encounter (Signed)
Follow up liver testing is normal. OK to continue lamisil for 8 more weeks. Refills sent to her pharmacy.

## 2019-05-20 ENCOUNTER — Encounter: Payer: Self-pay | Admitting: Family

## 2019-05-20 DIAGNOSIS — F22 Delusional disorders: Secondary | ICD-10-CM

## 2019-05-22 ENCOUNTER — Other Ambulatory Visit (HOSPITAL_BASED_OUTPATIENT_CLINIC_OR_DEPARTMENT_OTHER): Payer: BC Managed Care – PPO

## 2019-05-22 ENCOUNTER — Ambulatory Visit (HOSPITAL_BASED_OUTPATIENT_CLINIC_OR_DEPARTMENT_OTHER): Payer: BC Managed Care – PPO

## 2019-06-05 ENCOUNTER — Ambulatory Visit (HOSPITAL_BASED_OUTPATIENT_CLINIC_OR_DEPARTMENT_OTHER): Payer: BC Managed Care – PPO

## 2019-06-05 ENCOUNTER — Other Ambulatory Visit (HOSPITAL_BASED_OUTPATIENT_CLINIC_OR_DEPARTMENT_OTHER): Payer: BC Managed Care – PPO

## 2019-06-07 ENCOUNTER — Encounter: Payer: Self-pay | Admitting: Family

## 2019-06-13 ENCOUNTER — Other Ambulatory Visit: Payer: Self-pay | Admitting: Family

## 2019-06-19 ENCOUNTER — Other Ambulatory Visit: Payer: Self-pay

## 2019-06-19 ENCOUNTER — Ambulatory Visit (HOSPITAL_BASED_OUTPATIENT_CLINIC_OR_DEPARTMENT_OTHER)
Admission: RE | Admit: 2019-06-19 | Discharge: 2019-06-19 | Disposition: A | Discharge status: Home / Self Care | Payer: BC Managed Care – PPO | Source: Ambulatory Visit | Attending: Family | Admitting: Family

## 2019-06-19 DIAGNOSIS — Z1231 Encounter for screening mammogram for malignant neoplasm of breast: Secondary | ICD-10-CM | POA: Diagnosis not present

## 2019-06-19 DIAGNOSIS — M8580 Other specified disorders of bone density and structure, unspecified site: Secondary | ICD-10-CM | POA: Insufficient documentation

## 2019-06-19 DIAGNOSIS — Z0000 Encounter for general adult medical examination without abnormal findings: Secondary | ICD-10-CM | POA: Diagnosis not present

## 2019-06-19 DIAGNOSIS — M85852 Other specified disorders of bone density and structure, left thigh: Secondary | ICD-10-CM | POA: Diagnosis not present

## 2019-06-22 ENCOUNTER — Other Ambulatory Visit: Payer: Self-pay

## 2019-06-22 ENCOUNTER — Encounter (INDEPENDENT_AMBULATORY_CARE_PROVIDER_SITE_OTHER): Payer: Self-pay | Admitting: Otolaryngology

## 2019-06-22 ENCOUNTER — Ambulatory Visit (INDEPENDENT_AMBULATORY_CARE_PROVIDER_SITE_OTHER): Payer: BC Managed Care – PPO | Admitting: Otolaryngology

## 2019-06-22 VITALS — Temp 97.7°F

## 2019-06-22 DIAGNOSIS — R439 Unspecified disturbances of smell and taste: Secondary | ICD-10-CM | POA: Diagnosis not present

## 2019-06-22 NOTE — Progress Notes (Signed)
HPI: Donna Anderson is a 58 y.o. female who presents is referred by her PCP for evaluation of chronic problems with smell ever since she developed Covid in December.  She initially completely lost her sense of smell for 5 months.  Over the last month or 2 she has had a chronic malodorous smell or rancid smell that has been persistent.  The smell seems worse in the mornings and when she has hot.  She has been treated with 1 short course of antibiotics that did not help. She states that she has had problems with sinus infections off and on for years. However the rancid smell that she is presently having is new. She has not noted any colored discharge from her nose or coughing up any colored discharge or mucus.  Past Medical History:  Diagnosis Date  . Allergy   . Anemia   . Anxiety   . Aortic atherosclerosis ([place])   . Arthritis   . Depression   . Diabetes type 2, controlled ([place]) 06/21/2016  . Diverticulosis 2015   CT SCAN   . GERD (gastroesophageal reflux disease)   . Graves disease 1997  . Hearing loss   . Hepatic steatosis   . History of chicken pox   . History of migraine   . Hyperlipidemia   . Hypertriglyceridemia 06/21/2016  . Internal hemorrhoids   . Kidney stones   . Osteopenia 07/08/2016  . Osteoporosis    osteopenia  . Tubular adenoma of colon    Past Surgical History:  Procedure Laterality Date  . APPENDECTOMY  2001  . COLONOSCOPY    . ELBOW SURGERY Right 2016  . ENDOMETRIAL ABLATION  2003  . HIP FRACTURE SURGERY Right 2009   fell playing with dog  . labrum repair Right 2011  . NASAL SINUS SURGERY     1998  . OOPHORECTOMY Right 2001   due to large ovarian cyst  . ovarian cysts     removed x 4 before the oophorectomy  . POLYPECTOMY    . THYROIDECTOMY  1997   Social History   Socioeconomic History  . Marital status: Married    Spouse name: Not on file  . Number of children: Not on file  . Years of education: Not on file  . Highest education level: Not on  file  Occupational History  . Not on file  Tobacco Use  . Smoking status: Former Smoker    Packs/day: 0.25    Years: 15.00    Pack years: 3.75    Quit date: 01/16/1999    Years since quitting: 20.4  . Smokeless tobacco: Never Used  Substance and Sexual Activity  . Alcohol use: Not Currently    Alcohol/week: 0.0 standard drinks    Comment: none in 2 years as of 05/07/18  . Drug use: No  . Sexual activity: Yes    Birth control/protection: None  Other Topics Concern  . 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    Completed 12th grade   1 dog, 1 cat   Was raised by her grandmother.    Social Determinants of Health   Financial Resource Strain:   . Difficulty of Paying Living Expenses:   Food Insecurity:   . Worried About Charity fundraiser in the Last Year:   . Ran  Out of Food in the Last Year:   Transportation Needs:   . Lack of Transportation (Medical):   Marland Kitchen Lack of Transportation (Non-Medical):   Physical Activity:   . Days of Exercise per Week:   . Minutes of Exercise per Session:   Stress:   . Feeling of Stress :   Social Connections:   . Frequency of Communication with Friends and Family:   . Frequency of Social Gatherings with Friends and Family:   . Attends Religious Services:   . Active Member of Clubs or Organizations:   . Attends Archivist Meetings:   Marland Kitchen Marital Status:    Family History  Problem Relation Age of Onset  . Hyperlipidemia Father   . Hypertension Father   . Heart failure Father 94  . Diabetes Sister   . Arthritis Paternal Grandmother   . Kidney disease Paternal Grandmother   . Diabetes Paternal Grandmother   . Arthritis Paternal Grandfather   . Colon cancer Neg Hx   . Colon polyps Neg Hx   . Esophageal cancer Neg Hx   . Rectal cancer Neg Hx   . Stomach cancer Neg Hx    . Liver disease Neg Hx   . Inflammatory bowel disease Neg Hx    Allergies  Allergen Reactions  . Tramadol Itching   Prior to Admission medications   Medication Sig Start Date End Date Taking? Authorizing Provider  amitriptyline (ELAVIL) 25 MG tablet Take 1 tablet (25 mg total) by mouth at bedtime. 04/06/19  Yes Debbrah Alar, NP  atorvastatin (LIPITOR) 20 MG tablet TAKE 1 TABLET DAILY 06/16/19  Yes Debbrah Alar, NP  empagliflozin (JARDIANCE) 10 MG TABS tablet Take 10 mg by mouth daily. 04/06/19  Yes Debbrah Alar, NP  escitalopram (LEXAPRO) 10 MG tablet Take 1 tablet (10 mg total) by mouth daily. 04/06/19  Yes Debbrah Alar, NP  glucose blood (ONETOUCH VERIO) test strip Use as instructed to check blood sugar one to two times a day.  DX  E11.9 04/06/19  Yes Debbrah Alar, NP  levothyroxine (SYNTHROID) 100 MCG tablet Take 1 tablet (100 mcg total) by mouth daily before breakfast. 04/06/19  Yes Debbrah Alar, NP  omeprazole (PRILOSEC) 40 MG capsule Take 1 capsule (40 mg total) by mouth daily. 04/06/19  Yes Debbrah Alar, NP  OneTouch Delica Lancets 18A MISC Use as instructed to check blood sugar one to two times a day.  DX  E11.9 04/06/19  Yes Debbrah Alar, NP  pioglitazone (ACTOS) 30 MG tablet Take 1 tablet (30 mg total) by mouth daily. 04/06/19  Yes Debbrah Alar, NP  rOPINIRole (REQUIP) 0.5 MG tablet Take 1 tablet (0.5 mg total) by mouth at bedtime. 04/03/19  Yes Debbrah Alar, NP  sitaGLIPtin (JANUVIA) 100 MG tablet Take 1 tablet (100 mg total) by mouth daily. 04/06/19  Yes Debbrah Alar, NP  terbinafine (LAMISIL) 250 MG tablet Take 1 tablet (250 mg total) by mouth daily. 05/11/19  Yes Debbrah Alar, NP  amoxicillin (AMOXIL) 500 MG capsule Take 1 capsule (500 mg total) by mouth 3 (three) times daily. 05/01/19   Debbrah Alar, NP  omeprazole (PRILOSEC) 40 MG capsule TAKE 1 CAPSULE DAILY 07/15/18   Debbrah Alar, NP      Positive ROS: Otherwise negative  All other systems have been reviewed and were otherwise negative with the exception of those mentioned in the HPI and as above.  Physical Exam: Constitutional: Alert, well-appearing, no acute distress Ears: External ears without lesions or tenderness. Ear canals are  clear bilaterally with intact, clear TMs.  Nasal: External nose without lesions. Septum with mild deformity to the left.. Clear nasal passages on anterior rhinoscopy. Nasal endoscopy was performed bilaterally and on nasal endoscopy both middle meatus regions were clear with no evidence of mucopurulent discharge.  Superior nasal cavity was clear.  Posterior nasal cavity and sphenoid region was clear with no obvious purulent discharge. Oral: Lips and gums without lesions. Tongue and palate mucosa without lesions. Posterior oropharynx clear. Neck: No palpable adenopathy or masses Respiratory: Breathing comfortably  Skin: No facial/neck lesions or rash noted.  Nasal/sinus endoscopy  Date/Time: 06/22/2019 6:07 PM Performed by: Rozetta Nunnery, MD Authorized by: Rozetta Nunnery, MD   Consent:    Consent obtained:  Verbal   Consent given by:  Patient Procedure details:    Indications: sino-nasal symptoms     Medication:  Afrin   Instrument: flexible fiberoptic nasal endoscope     Scope location: bilateral nare   Sinus:    Right middle meatus: normal     Left middle meatus: normal     Right nasopharynx: normal     Left nasopharynx: normal   Comments:     On nasal endoscopy both nasal cavities were clear with no evidence of acute infection.    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  Plan: Reviewed with [facility] concerning limited treatment options.  I suspect this will gradually improve.  I think use of the nasal steroid spray Flonase or Nasacort as well as use of saline irrigation may be beneficial. I would not recommend  taking antibiotics unless she develops purulent mucus discharge from her nose or coughing up purulent mucus.  But presently on clinical exam no clinical evidence of obvious infection.   Radene Journey, MD   CC:

## 2019-06-23 ENCOUNTER — Other Ambulatory Visit: Payer: Self-pay | Admitting: Family

## 2019-06-24 ENCOUNTER — Encounter: Payer: Self-pay | Admitting: Family

## 2019-06-28 ENCOUNTER — Other Ambulatory Visit: Payer: Self-pay | Admitting: Family

## 2019-06-29 ENCOUNTER — Other Ambulatory Visit: Payer: Self-pay

## 2019-06-29 MED ORDER — EMPAGLIFLOZIN 10 MG PO TABS: 10.0000 mg | ORAL_TABLET | Freq: Every day | ORAL | 5 refills | Status: DC

## 2019-06-29 MED ORDER — ONETOUCH VERIO VI STRP: ORAL_STRIP | 1 refills | Status: DC

## 2019-06-29 MED ORDER — PIOGLITAZONE HCL 30 MG PO TABS: 30.0000 mg | ORAL_TABLET | Freq: Every day | ORAL | 1 refills | Status: DC

## 2019-06-29 MED ORDER — ONETOUCH DELICA LANCETS 33G MISC: 1 refills | Status: DC

## 2019-06-29 NOTE — Telephone Encounter (Signed)
Medications sent to requested pharmacy

## 2019-07-10 ENCOUNTER — Ambulatory Visit: Payer: BC Managed Care – PPO | Admitting: Family

## 2019-07-13 ENCOUNTER — Encounter: Payer: Self-pay | Admitting: Family

## 2019-07-24 ENCOUNTER — Other Ambulatory Visit: Payer: Self-pay

## 2019-07-24 ENCOUNTER — Encounter: Payer: Self-pay | Admitting: Family

## 2019-07-24 ENCOUNTER — Ambulatory Visit: Payer: BC Managed Care – PPO | Admitting: Family

## 2019-07-24 ENCOUNTER — Telehealth: Payer: Self-pay | Admitting: Family

## 2019-07-24 ENCOUNTER — Other Ambulatory Visit: Payer: Self-pay | Admitting: Family

## 2019-07-24 VITALS — BP 111/71 | HR 84 | Temp 98.2°F | Resp 16 | Ht 63.0 in | Wt 141.4 lb

## 2019-07-24 DIAGNOSIS — G2581 Restless legs syndrome: Secondary | ICD-10-CM

## 2019-07-24 DIAGNOSIS — E1165 Type 2 diabetes mellitus with hyperglycemia: Secondary | ICD-10-CM | POA: Diagnosis not present

## 2019-07-24 DIAGNOSIS — G4700 Insomnia, unspecified: Secondary | ICD-10-CM | POA: Diagnosis not present

## 2019-07-24 DIAGNOSIS — E039 Hypothyroidism, unspecified: Secondary | ICD-10-CM

## 2019-07-24 DIAGNOSIS — E875 Hyperkalemia: Secondary | ICD-10-CM

## 2019-07-24 DIAGNOSIS — M8580 Other specified disorders of bone density and structure, unspecified site: Secondary | ICD-10-CM

## 2019-07-24 LAB — BASIC METABOLIC PANEL
BUN: 16 mg/dL (ref 6–23)
CO2: 31 mEq/L (ref 19–32)
Calcium: 9.3 mg/dL (ref 8.4–10.5)
Chloride: 103 mEq/L (ref 96–112)
Creatinine, Ser: 0.82 mg/dL (ref 0.40–1.20)
GFR: 71.63 mL/min (ref >60.00)
Glucose, Bld: 133 mg/dL — ABNORMAL HIGH (ref 70–99)
Potassium: 5.3 mEq/L — ABNORMAL HIGH (ref 3.5–5.1)
Sodium: 140 mEq/L (ref 135–145)

## 2019-07-24 LAB — HEMOGLOBIN A1C: Hgb A1c MFr Bld: 7.2 % — ABNORMAL HIGH (ref 4.6–6.5)

## 2019-07-24 NOTE — Patient Instructions (Signed)
Please complete lab work prior to leaving.   

## 2019-07-24 NOTE — Telephone Encounter (Signed)
Please call [facility] in [place] to request DM eye exam.

## 2019-07-24 NOTE — Telephone Encounter (Signed)
Left detailed message for patient to call back in reference to results and to set up lab appointment for BMET next week.

## 2019-07-24 NOTE — Progress Notes (Signed)
Subjective:    Patient ID: Donna Anderson, female    DOB: March 27, 1960, 59 y.o.   MRN: [id number]  HPI   DM2-  Reports home sugars 135-146. Maintained on [place], j[facility]iance and actos.   Lab Results  Component Value Date   HGBA1C 7.2 (H) 04/03/2019   HGBA1C 8.1 (H) 02/28/2018   HGBA1C 8.7 (H) 06/28/2017   Lab Results  Component Value Date   MICROALBUR 1.8 04/03/2019   LDLCALC 76 04/03/2019   CREATININE 0.79 04/03/2019   RLS- bothers her every night 6:30-7:30  Hypothyroid- Lab Results  Component Value Date   TSH 3.05 04/06/2019   maintained on synthroid 136mcg.  Allergies- no symptoms recently.        Review of Systems See HPI  Past Medical History:  Diagnosis Date   Allergy    Anemia    Anxiety    Aortic atherosclerosis ([place])    Arthritis    Depression    Diabetes type 2, controlled ([place]) 06/21/2016   Diverticulosis 2015   CT SCAN    GERD (gastroesophageal reflux disease)    Graves disease 1997   Hearing loss    Hepatic steatosis    History of chicken pox    History of migraine    Hyperlipidemia    Hypertriglyceridemia 06/21/2016   Internal hemorrhoids    Kidney stones    Osteopenia 07/08/2016   Osteoporosis    osteopenia   Tubular adenoma of colon      Social History   Socioeconomic History   Marital status: Married    Spouse name: Not on file   Number of children: Not on file   Years of education: Not on file   Highest education level: Not on file  Occupational History   Not on file  Tobacco Use   Smoking status: Former Smoker    Packs/day: 0.25    Years: 15.00    Pack years: 3.75    Quit date: 01/16/1999    Years since quitting: 20.5   Smokeless tobacco: Never Used  Substance and Sexual Activity   Alcohol use: Not Currently    Alcohol/week: 0.0 standard drinks    Comment: none in 2 years as of 05/07/18   Drug use: No   Sexual activity: Yes    Birth control/protection: None  Other Topics Concern    Not on file  Social History Narrative   Married (second marriage) has one son in [place]   Worked in Psychologist, 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    Completed 12th grade   1 dog, 1 cat   Was raised by her grandmother.    Social Determinants of Health   Financial Resource Strain:    Difficulty of Paying Living Expenses:   Food Insecurity:    Worried About Charity fundraiser in the Last Year:    Arboriculturist in the Last Year:   Transportation Needs:    Film/video editor (Medical):    Lack of Transportation (Non-Medical):   Physical Activity:    Days of Exercise per Week:    Minutes of Exercise per Session:   Stress:    Feeling of Stress :   Social Connections:    Frequency of Communication with Friends and Family:    Frequency of Social Gatherings with Friends and Family:    Attends Religious Services:    Active Member of Clubs or Organizations:  Attends Archivist Meetings:    Marital Status:   Intimate Partner Violence:    Fear of Current or Ex-Partner:    Emotionally Abused:    Physically Abused:    Sexually Abused:     Past Surgical History:  Procedure Laterality Date   APPENDECTOMY  2001   COLONOSCOPY     ELBOW SURGERY Right 2016   ENDOMETRIAL ABLATION  2003   HIP FRACTURE SURGERY Right 2009   fell playing with dog   labrum repair Right 2011   [place] Right 2001   due to large ovarian cyst   ovarian cysts     removed x 4 before the oophorectomy   POLYPECTOMY     THYROIDECTOMY  1997    Family History  Problem Relation Age of Onset   Hyperlipidemia Father    Hypertension Father    Heart failure Father 49   Diabetes Sister    Arthritis Paternal Grandmother    Kidney disease Paternal Grandmother    Diabetes Paternal Grandmother    Arthritis Paternal Grandfather    Colon cancer Neg Hx    Colon  polyps Neg Hx    Esophageal cancer Neg Hx    Rectal cancer Neg Hx    Stomach cancer Neg Hx    Liver disease Neg Hx    Inflammatory bowel disease Neg Hx     Allergies  Allergen Reactions   Tramadol Itching    Current Outpatient Medications on File Prior to Visit  Medication Sig Dispense Refill   amitriptyline (ELAVIL) 25 MG tablet Take 1 tablet (25 mg total) by mouth at bedtime. 90 tablet 1   atorvastatin (LIPITOR) 20 MG tablet TAKE 1 TABLET DAILY 90 tablet 1   empagliflozin (JARDIANCE) 10 MG TABS tablet Take 1 tablet (10 mg total) by mouth daily. 30 tablet 5   escitalopram (LEXAPRO) 10 MG tablet Take 1 tablet (10 mg total) by mouth daily. 90 tablet 1   glucose blood (ONETOUCH VERIO) test strip Use as instructed to check blood sugar one to two times a day.  DX  E11.9 200 each 1   JANUVIA 100 MG tablet Take 1 tablet by mouth once daily 90 tablet 0   omeprazole (PRILOSEC) 40 MG capsule TAKE 1 CAPSULE DAILY 90 capsule 1   OneTouch Delica Lancets 80D MISC Use as instructed to check blood sugar one to two times a day.  DX  E11.9 200 each 1   pioglitazone (ACTOS) 30 MG tablet Take 1 tablet (30 mg total) by mouth daily. 90 tablet 1   rOPINIRole (REQUIP) 0.5 MG tablet Take 1 tablet (0.5 mg total) by mouth at bedtime. 30 tablet 5   SYNTHROID 100 MCG tablet TAKE 1 TABLET DAILY BEFORE BREAKFAST 90 tablet 1   terbinafine (LAMISIL) 250 MG tablet Take 1 tablet (250 mg total) by mouth daily. 30 tablet 1   amoxicillin (AMOXIL) 500 MG capsule Take 1 capsule (500 mg total) by mouth 3 (three) times daily. 30 capsule 0   [DISCONTINUED] omeprazole (PRILOSEC) 40 MG capsule TAKE 1 CAPSULE DAILY 90 capsule 1   No current facility-administered medications on file prior to visit.    BP 111/71 (BP Location: Right Arm, Patient Position: Sitting, Cuff Size: Small)    Pulse 84    Temp 98.2 F (36.8 C) (Oral)    Resp 16    Ht 5\' 3"  (1.6 m)    Wt 141 lb 6.4 oz (  64.1 kg)    LMP 01/26/2001    SpO2  98%    BMI 25.05 kg/m       Objective:   Physical Exam Constitutional:      Appearance: She is well-developed.  Cardiovascular:     Rate and Rhythm: Normal rate and regular rhythm.     Heart sounds: Normal heart sounds. No murmur heard.   Pulmonary:     Effort: Pulmonary effort is normal. No respiratory distress.     Breath sounds: Normal breath sounds. No wheezing.  Psychiatric:        Behavior: Behavior normal.        Thought Content: Thought content normal.        Judgment: Judgment normal.           Assessment & Plan:  Hypothyroid- clinically stable on synthroid.  DM2- clinically stable- obtain follow up A1C, continue current medications.  RLS-uncontrolled. Suggested she take her Requip at 5 PM since she is having bad symptoms beginning at 6:30PM.    Osteopenia- obtain follow up bone density.  Seasonal Allergies- stable.   This visit occurred during the SARS-CoV-2 public health emergency.  Safety protocols were in place, including screening questions prior to the visit, additional usage of staff PPE, and extensive cleaning of exam room while observing appropriate contact time as indicated for disinfecting solutions.

## 2019-07-24 NOTE — Telephone Encounter (Signed)
Potassium is mildly elevated. Let's have her return to the lab next week for follow up bmet please, dx hypekalemia.

## 2019-07-24 NOTE — Addendum Note (Signed)
Addended by: Debbrah Alar on: 07/24/2019 11:46 AM   Modules accepted: Orders

## 2019-07-24 NOTE — Telephone Encounter (Signed)
See mychart.  

## 2019-07-28 NOTE — Telephone Encounter (Signed)
[facility] location and was told to fax request. Request faxed to [phone].

## 2019-07-28 NOTE — Telephone Encounter (Signed)
Notified pt of below and scheduled lab appt for 7/16. Future lab order placed.

## 2019-07-31 ENCOUNTER — Other Ambulatory Visit (INDEPENDENT_AMBULATORY_CARE_PROVIDER_SITE_OTHER): Payer: BC Managed Care – PPO

## 2019-07-31 ENCOUNTER — Other Ambulatory Visit: Payer: Self-pay

## 2019-07-31 DIAGNOSIS — E875 Hyperkalemia: Secondary | ICD-10-CM

## 2019-07-31 LAB — BASIC METABOLIC PANEL
BUN: 14 mg/dL (ref 6–23)
CO2: 29 mEq/L (ref 19–32)
Calcium: 9.3 mg/dL (ref 8.4–10.5)
Chloride: 101 mEq/L (ref 96–112)
Creatinine, Ser: 0.89 mg/dL (ref 0.40–1.20)
GFR: 65.17 mL/min (ref >60.00)
Glucose, Bld: 176 mg/dL — ABNORMAL HIGH (ref 70–99)
Potassium: 4.5 mEq/L (ref 3.5–5.1)
Sodium: 138 mEq/L (ref 135–145)

## 2019-08-11 ENCOUNTER — Encounter: Payer: Self-pay | Admitting: Family

## 2019-08-11 NOTE — Telephone Encounter (Signed)
Ok to write note

## 2019-08-12 ENCOUNTER — Encounter: Payer: Self-pay | Admitting: Family

## 2019-08-12 ENCOUNTER — Other Ambulatory Visit: Payer: Self-pay

## 2019-08-12 ENCOUNTER — Ambulatory Visit: Payer: BC Managed Care – PPO | Admitting: Family

## 2019-08-12 VITALS — BP 112/70 | HR 102 | Resp 16 | Ht 63.0 in | Wt 136.8 lb

## 2019-08-12 DIAGNOSIS — M5412 Radiculopathy, cervical region: Secondary | ICD-10-CM

## 2019-08-12 MED ORDER — CYCLOBENZAPRINE HCL 5 MG PO TABS
5.0000 mg | ORAL_TABLET | Freq: Three times a day (TID) | ORAL | 1 refills | Status: DC | PRN
Start: 2019-08-12 — End: 2020-11-25

## 2019-08-12 MED ORDER — METHYLPREDNISOLONE 4 MG PO TBPK
ORAL_TABLET | ORAL | 0 refills | Status: DC
Start: 2019-08-12 — End: 2020-04-18

## 2019-08-12 MED FILL — CYCLOBENZAPRINE HCL 5 MG TA: 5 | 7 days supply | Qty: 20 | Fill #0

## 2019-08-12 MED FILL — METHYLPREDNISOLONE 4 MG TBP: 4 | 6 days supply | Qty: 21 | Fill #0

## 2019-08-12 NOTE — Patient Instructions (Signed)
Stop ibuprofen. Start steroid (Medrol Dose Pak) and as needed flexeril (muscle relaxer). Check sugars twice daily while taking steroids. Call us if sugars >300. Call if symptoms worsen or if not improved in 1 week.

## 2019-08-12 NOTE — Progress Notes (Signed)
Subjective:    Patient ID: Donna Anderson, female    DOB: 1960/03/17, 59 y.o.   MRN: [id number]  HPI  Patient is a 59 year old female who presents today with chief complaint of pain radiating down the left arm.  She reports the pain began 2 weeks ago in the middle of the left shoulder blade.  It then began to radiate down the left arm.  Pain is described as severe at times and worse with certain positions.  She denies known injury.  She has been unable to work this week due to the pain.  She reports that she has been taking ibuprofen 800 mg every 6 hours with some mild improvement.  Her husband has also been applying icy hot to her left arm and shoulder which has been helping some. Review of Systems Past Medical History:  Diagnosis Date  . Allergy   . Anemia   . Anxiety   . Aortic atherosclerosis ([place])   . Arthritis   . Depression   . Diabetes type 2, controlled ([place]) 06/21/2016  . Diverticulosis 2015   CT SCAN   . GERD (gastroesophageal reflux disease)   . Graves disease 1997  . Hearing loss   . Hepatic steatosis   . History of chicken pox   . History of migraine   . Hyperlipidemia   . Hypertriglyceridemia 06/21/2016  . Internal hemorrhoids   . Kidney stones   . Osteopenia 07/08/2016  . Osteoporosis    osteopenia  . Tubular adenoma of colon      Social History   Socioeconomic History  . Marital status: Married    Spouse name: Not on file  . Number of children: Not on file  . Years of education: Not on file  . Highest education level: Not on file  Occupational History  . Not on file  Tobacco Use  . Smoking status: Former Smoker    Packs/day: 0.25    Years: 15.00    Pack years: 3.75    Quit date: 01/16/1999    Years since quitting: 20.5  . Smokeless tobacco: Never Used  Substance and Sexual Activity  . Alcohol use: Not Currently    Alcohol/week: 0.0 standard drinks    Comment: none in 2 years as of 05/07/18  . Drug use: No  . Sexual activity: Yes    Birth  control/protection: None  Other Topics Concern  . 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    Completed 12th grade   1 dog, 1 cat   Was raised by her grandmother.    Social Determinants of Health   Financial Resource Strain:   . Difficulty of Paying Living Expenses:   Food Insecurity:   . Worried About Charity fundraiser in the Last Year:   . Arboriculturist in the Last Year:   Transportation Needs:   . Film/video editor (Medical):   Marland Kitchen Lack of Transportation (Non-Medical):   Physical Activity:   . Days of Exercise per Week:   . Minutes of Exercise per Session:   Stress:   . Feeling of Stress :   Social Connections:   . Frequency of Communication with Friends and Family:   . Frequency of Social Gatherings with Friends and Family:   . Attends Religious Services:   .  Active Member of Clubs or Organizations:   . Attends Archivist Meetings:   Marland Kitchen Marital Status:   Intimate Partner Violence:   . Fear of Current or Ex-Partner:   . Emotionally Abused:   Marland Kitchen Physically Abused:   . Sexually Abused:     Past Surgical History:  Procedure Laterality Date  . APPENDECTOMY  2001  . COLONOSCOPY    . ELBOW SURGERY Right 2016  . ENDOMETRIAL ABLATION  2003  . HIP FRACTURE SURGERY Right 2009   fell playing with dog  . labrum repair Right 2011  . NASAL SINUS SURGERY     1998  . OOPHORECTOMY Right 2001   due to large ovarian cyst  . ovarian cysts     removed x 4 before the oophorectomy  . POLYPECTOMY    . THYROIDECTOMY  1997    Family History  Problem Relation Age of Onset  . Hyperlipidemia Father   . Hypertension Father   . Heart failure Father 71  . Diabetes Sister   . Arthritis Paternal Grandmother   . Kidney disease Paternal Grandmother   . Diabetes Paternal Grandmother   . Arthritis Paternal  Grandfather   . Colon cancer Neg Hx   . Colon polyps Neg Hx   . Esophageal cancer Neg Hx   . Rectal cancer Neg Hx   . Stomach cancer Neg Hx   . Liver disease Neg Hx   . Inflammatory bowel disease Neg Hx     Allergies  Allergen Reactions  . Tramadol Itching    Current Outpatient Medications on File Prior to Visit  Medication Sig Dispense Refill  . amitriptyline (ELAVIL) 25 MG tablet Take 1 tablet (25 mg total) by mouth at bedtime. 90 tablet 1  . atorvastatin (LIPITOR) 20 MG tablet TAKE 1 TABLET DAILY 90 tablet 1  . empagliflozin (JARDIANCE) 10 MG TABS tablet Take 1 tablet (10 mg total) by mouth daily. 30 tablet 5  . escitalopram (LEXAPRO) 10 MG tablet Take 1 tablet (10 mg total) by mouth daily. 90 tablet 1  . glucose blood (ONETOUCH VERIO) test strip Use as instructed to check blood sugar one to two times a day.  DX  E11.9 200 each 1  . JANUVIA 100 MG tablet Take 1 tablet by mouth once daily 90 tablet 0  . omeprazole (PRILOSEC) 40 MG capsule TAKE 1 CAPSULE DAILY 90 capsule 1  . OneTouch Delica Lancets 38G MISC Use as instructed to check blood sugar one to two times a day.  DX  E11.9 200 each 1  . pioglitazone (ACTOS) 30 MG tablet Take 1 tablet (30 mg total) by mouth daily. 90 tablet 1  . rOPINIRole (REQUIP) 0.5 MG tablet Take 1 tablet (0.5 mg total) by mouth at bedtime. 30 tablet 5  . SYNTHROID 100 MCG tablet TAKE 1 TABLET DAILY BEFORE BREAKFAST 90 tablet 1  . terbinafine (LAMISIL) 250 MG tablet Take 1 tablet (250 mg total) by mouth daily. 30 tablet 1  . [DISCONTINUED] omeprazole (PRILOSEC) 40 MG capsule TAKE 1 CAPSULE DAILY 90 capsule 1   No current facility-administered medications on file prior to visit.    BP 112/70 (BP Location: Right Arm, Patient Position: Sitting, Cuff Size: Normal)   Pulse 102   Resp 16   Ht 5\' 3"  (1.6 m)   Wt 136 lb 12.8 oz (62.1 kg)   LMP 01/26/2001   SpO2 99%   BMI 24.23 kg/m       Objective:   Physical Exam  Constitutional:      Appearance:  She is well-developed.  Neck:     Thyroid: No thyromegaly.  Cardiovascular:     Rate and Rhythm: Normal rate and regular rhythm.     Heart sounds: Normal heart sounds. No murmur heard.   Pulmonary:     Effort: Pulmonary effort is normal. No respiratory distress.     Breath sounds: Normal breath sounds. No wheezing.  Musculoskeletal:     Cervical back: Neck supple.  Skin:    General: Skin is warm and dry.  Neurological:     Mental Status: She is alert and oriented to person, place, and time.     Comments: Bilateral UE strength is 5/5  Psychiatric:        Behavior: Behavior normal.        Thought Content: Thought content normal.        Judgment: Judgment normal.           Assessment & Plan:  C7 radiculopathy-will Rx with a Medrol Dosepak and as needed Flexeril.  Patient is advised to discontinue meloxicam.  I have also advised her to check her sugars twice daily while on Medrol and call our office if sugars are running greater than 300.  Patient verbalizes understanding.  This visit occurred during the SARS-CoV-2 public health emergency.  Safety protocols were in place, including screening questions prior to the visit, additional usage of staff PPE, and extensive cleaning of exam room while observing appropriate contact time as indicated for disinfecting solutions.

## 2019-08-13 ENCOUNTER — Telehealth: Payer: Self-pay | Admitting: Family

## 2019-08-13 ENCOUNTER — Ambulatory Visit: Payer: BC Managed Care – PPO | Admitting: Family

## 2019-08-13 NOTE — Telephone Encounter (Signed)
Caller: [place] Call back phone number: [phone]  Patient states she started Prednisone yesterday her sugar levels this morning was 245, just now 03:40 pm is 396.  Please advise

## 2019-08-13 NOTE — Telephone Encounter (Signed)
Follow up blood sugar 10 minutes ago is 355. Advised pt not to take bedtime dose of medrol and to watch her diet carefully the next few days. Call us back if sugar >400.

## 2019-08-17 ENCOUNTER — Other Ambulatory Visit: Payer: Self-pay | Admitting: Family

## 2019-09-28 ENCOUNTER — Other Ambulatory Visit: Payer: Self-pay | Admitting: Family

## 2019-10-15 ENCOUNTER — Other Ambulatory Visit: Payer: Self-pay | Admitting: Family

## 2019-10-16 ENCOUNTER — Encounter: Payer: Self-pay | Admitting: Family

## 2019-10-16 MED ORDER — SITAGLIPTIN PHOSPHATE 100 MG PO TABS: 100.0000 mg | ORAL_TABLET | Freq: Every day | ORAL | 0 refills | Status: DC

## 2019-10-21 ENCOUNTER — Encounter: Payer: Self-pay | Admitting: Family

## 2019-10-21 DIAGNOSIS — S93402A Sprain of unspecified ligament of left ankle, initial encounter: Secondary | ICD-10-CM | POA: Diagnosis not present

## 2019-10-31 ENCOUNTER — Other Ambulatory Visit: Payer: Self-pay | Admitting: Family

## 2020-01-03 ENCOUNTER — Other Ambulatory Visit: Payer: Self-pay | Admitting: Family

## 2020-02-14 ENCOUNTER — Other Ambulatory Visit: Payer: Self-pay | Admitting: Family

## 2020-02-15 MED ORDER — SITAGLIPTIN PHOSPHATE 100 MG PO TABS: 100.0000 mg | ORAL_TABLET | Freq: Every day | ORAL | 0 refills | Status: DC

## 2020-03-27 ENCOUNTER — Encounter: Payer: Self-pay | Admitting: Family

## 2020-04-02 ENCOUNTER — Other Ambulatory Visit: Payer: Self-pay | Admitting: Family

## 2020-04-15 ENCOUNTER — Other Ambulatory Visit: Payer: Self-pay

## 2020-04-15 ENCOUNTER — Ambulatory Visit: Payer: BC Managed Care – PPO | Admitting: Family

## 2020-04-15 ENCOUNTER — Encounter: Payer: Self-pay | Admitting: Family

## 2020-04-15 VITALS — BP 122/89 | HR 75 | Temp 98.2°F | Resp 16 | Ht 63.0 in | Wt 141.4 lb

## 2020-04-15 DIAGNOSIS — H9190 Unspecified hearing loss, unspecified ear: Secondary | ICD-10-CM

## 2020-04-15 DIAGNOSIS — E039 Hypothyroidism, unspecified: Secondary | ICD-10-CM | POA: Diagnosis not present

## 2020-04-15 DIAGNOSIS — E781 Pure hyperglyceridemia: Secondary | ICD-10-CM

## 2020-04-15 DIAGNOSIS — Z0000 Encounter for general adult medical examination without abnormal findings: Secondary | ICD-10-CM

## 2020-04-15 DIAGNOSIS — E1165 Type 2 diabetes mellitus with hyperglycemia: Secondary | ICD-10-CM | POA: Diagnosis not present

## 2020-04-15 DIAGNOSIS — Z23 Encounter for immunization: Secondary | ICD-10-CM | POA: Diagnosis not present

## 2020-04-15 DIAGNOSIS — G4700 Insomnia, unspecified: Secondary | ICD-10-CM

## 2020-04-15 LAB — COMPREHENSIVE METABOLIC PANEL
ALT: 20 U/L (ref 0–35)
AST: 16 U/L (ref 0–37)
Albumin: 4.6 g/dL (ref 3.5–5.2)
Alkaline Phosphatase: 108 U/L (ref 39–117)
BUN: 15 mg/dL (ref 6–23)
CO2: 30 mEq/L (ref 19–32)
Calcium: 9.5 mg/dL (ref 8.4–10.5)
Chloride: 101 mEq/L (ref 96–112)
Creatinine, Ser: 0.87 mg/dL (ref 0.40–1.20)
GFR: 73.34 mL/min (ref >60.00)
Glucose, Bld: 167 mg/dL — ABNORMAL HIGH (ref 70–99)
Potassium: 4.6 mEq/L (ref 3.5–5.1)
Sodium: 139 mEq/L (ref 135–145)
Total Bilirubin: 0.5 mg/dL (ref 0.2–1.2)
Total Protein: 7 g/dL (ref 6.0–8.3)

## 2020-04-15 LAB — TSH: TSH: 41.27 u[IU]/mL — ABNORMAL HIGH (ref 0.35–4.50)

## 2020-04-15 LAB — LIPID PANEL
Cholesterol: 201 mg/dL — ABNORMAL HIGH (ref 0–200)
HDL: 43.9 mg/dL (ref >39.00)
NonHDL: 157.59
Total CHOL/HDL Ratio: 5
Triglycerides: 354 mg/dL — ABNORMAL HIGH (ref 0.0–149.0)
VLDL: 70.8 mg/dL — ABNORMAL HIGH (ref 0.0–40.0)

## 2020-04-15 LAB — LDL CHOLESTEROL, DIRECT: Direct LDL: 106 mg/dL

## 2020-04-15 LAB — HEMOGLOBIN A1C: Hgb A1c MFr Bld: 9.1 % — ABNORMAL HIGH (ref 4.6–6.5)

## 2020-04-15 MED ORDER — ONETOUCH VERIO VI STRP: ORAL_STRIP | 1 refills | Status: DC

## 2020-04-15 MED ORDER — PANTOPRAZOLE SODIUM 40 MG PO TBEC: 40.0000 mg | DELAYED_RELEASE_TABLET | Freq: Every day | ORAL | 1 refills | Status: DC

## 2020-04-15 MED ORDER — SITAGLIPTIN PHOSPHATE 100 MG PO TABS: 100.0000 mg | ORAL_TABLET | Freq: Every day | ORAL | 1 refills | Status: DC

## 2020-04-15 NOTE — Progress Notes (Signed)
Subjective:    Patient ID: Donna Anderson, female    DOB: [birth year], 60 y.o.   MRN: [id number]  HPI   Patient is a 60 year old female who presents today for follow-up.  DM2-she has been out of her Januvia for some time as she is overdue for follow-up. Lab Results  Component Value Date   HGBA1C 7.2 (H) 07/24/2019   HGBA1C 7.2 (H) 04/03/2019   HGBA1C 8.1 (H) 02/28/2018   Lab Results  Component Value Date   MICROALBUR 1.8 04/03/2019   LDLCALC 76 04/03/2019   CREATININE 0.89 07/31/2019   RLS  Hypothyroid-  Lab Results  Component Value Date   TSH 3.05 04/06/2019   Patient also presents today for complete physical.  Immunizations: due for Tetanus and Pfizer booster Diet: healthy- eating more salads and cutting her portions  Exercise: not exercising Wt Readings from Last 3 Encounters:  04/15/20 141 lb 6.4 oz (64.1 kg)  08/12/19 136 lb 12.8 oz (62.1 kg)  07/24/19 141 lb 6.4 oz (64.1 kg)  Colonoscopy: 2018, due 2023 Pap Smear: declines today, will refer to GYN Mammogram: Will be due in early June Vision: next week  Insomnia- stable on elavil.  Depression- depression is stable. On lexapro 10mg .   GERD- on omeprazole 40mg , still has breakthrough symptoms.  Review of Systems  Constitutional: Negative for unexpected weight change.  HENT: Positive for hearing loss (has hearing aids). Negative for rhinorrhea.   Eyes: Negative for visual disturbance.  Respiratory: Negative for cough and shortness of breath.   Cardiovascular: Negative for chest pain.  Gastrointestinal: Positive for constipation (occasional constipation). Negative for diarrhea.  Genitourinary: Negative for dysuria and frequency.  Musculoskeletal: Positive for arthralgias (left hip and fingers).  Skin: Negative for rash.  Neurological: Negative for headaches.  Hematological: Negative for adenopathy.  Psychiatric/Behavioral:       Denies depression/anxiety    Past Medical History:  Diagnosis Date  .  Allergy   . Anemia   . Anxiety   . Aortic atherosclerosis ([place])   . Arthritis   . Depression   . Diabetes type 2, controlled ([place]) 06/21/2016  . Diverticulosis 2015   CT SCAN   . GERD (gastroesophageal reflux disease)   . Graves disease 1997  . Hearing loss   . Hepatic steatosis   . History of chicken pox   . History of migraine   . Hyperlipidemia   . Hypertriglyceridemia 06/21/2016  . Internal hemorrhoids   . Kidney stones   . Osteopenia 07/08/2016  . Osteoporosis    osteopenia  . Tubular adenoma of colon      Social History   Socioeconomic History  . Marital status: Married    Spouse name: Not on file  . Number of children: Not on file  . Years of education: Not on file  . Highest education level: Not on file  Occupational History  . Not on file  Tobacco Use  . Smoking status: Former Smoker    Packs/day: 0.25    Years: 15.00    Pack years: 3.75    Quit date: 01/16/1999    Years since quitting: 21.2  . Smokeless tobacco: Never Used  Substance and Sexual Activity  . Alcohol use: Not Currently    Alcohol/week: 0.0 standard drinks    Comment: none in 2 years as of 05/07/18  . Drug use: No  . Sexual activity: Yes    Birth control/protection: None  Other Topics Concern  . Not on file  Social History  Narrative   Married (second marriage) has one son in [place]   Worked in Psychologist, 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    Completed 12th grade   1 dog, 1 cat   Was raised by her grandmother.    Social Determinants of Health   Financial Resource Strain: Not on file  Food Insecurity: Not on file  Transportation Needs: Not on file  Physical Activity: Not on file  Stress: Not on file  Social Connections: Not on file  Intimate Partner Violence: Not on file    Past Surgical History:  Procedure Laterality Date  . APPENDECTOMY  2001  . COLONOSCOPY    . ELBOW SURGERY Right 2016  . ENDOMETRIAL ABLATION  2003   . HIP FRACTURE SURGERY Right 2009   fell playing with dog  . labrum repair Right 2011  . NASAL SINUS SURGERY     1998  . OOPHORECTOMY Right 2001   due to large ovarian cyst  . ovarian cysts     removed x 4 before the oophorectomy  . POLYPECTOMY    . THYROIDECTOMY  1997    Family History  Problem Relation Age of Onset  . Hyperlipidemia Father   . Hypertension Father   . Heart failure Father 103  . Diabetes Sister   . Arthritis Paternal Grandmother   . Kidney disease Paternal Grandmother   . Diabetes Paternal Grandmother   . Arthritis Paternal Grandfather   . Hepatitis C Son   . Arthritis Son   . Drug abuse Son   . Colon cancer Neg Hx   . Colon polyps Neg Hx   . Esophageal cancer Neg Hx   . Rectal cancer Neg Hx   . Stomach cancer Neg Hx   . Liver disease Neg Hx   . Inflammatory bowel disease Neg Hx     Allergies  Allergen Reactions  . Tramadol Itching    Current Outpatient Medications on File Prior to Visit  Medication Sig Dispense Refill  . amitriptyline (ELAVIL) 25 MG tablet Take 1 tablet (25 mg total) by mouth at bedtime. 90 tablet 1  . atorvastatin (LIPITOR) 20 MG tablet TAKE 1 TABLET DAILY 90 tablet 1  . cyclobenzaprine (FLEXERIL) 5 MG tablet Take 1 tablet (5 mg total) by mouth 3 (three) times daily as needed for muscle spasms. 20 tablet 1  . empagliflozin (JARDIANCE) 10 MG TABS tablet Take 1 tablet (10 mg total) by mouth daily. 30 tablet 5  . escitalopram (LEXAPRO) 10 MG tablet TAKE 1 TABLET DAILY. 90 tablet 1  . methylPREDNISolone (MEDROL DOSEPAK) 4 MG TBPK tablet Take per package instructions. 21 tablet 0  . OneTouch Delica Lancets 86P MISC Use as instructed to check blood sugar one to two times a day.  DX  E11.9 200 each 1  . pioglitazone (ACTOS) 30 MG tablet Take 1 tablet (30 mg total) by mouth daily. 90 tablet 1  . rOPINIRole (REQUIP) 0.5 MG tablet TAKE 1 TABLET BY MOUTH AT BEDTIME 30 tablet 0  . SYNTHROID 100 MCG tablet TAKE 1 TABLET DAILY BEFORE  BREAKFAST 90 tablet 0   No current facility-administered medications on file prior to visit.    BP 122/89 (BP Location: Right Arm, Patient Position: Sitting, Cuff Size: Small)   Pulse 75   Temp 98.2 F (36.8 C) (Oral)   Resp 16   Ht 5\' 3"  (1.6 m)   Wt 141 lb 6.4 oz (64.1 kg)  LMP 01/26/2001   SpO2 98%   BMI 25.05 kg/m     Past Medical History:  Diagnosis Date  . Allergy   . Anemia   . Anxiety   . Aortic atherosclerosis ([place])   . Arthritis   . Depression   . Diabetes type 2, controlled ([place]) 06/21/2016  . Diverticulosis 2015   CT SCAN   . GERD (gastroesophageal reflux disease)   . Graves disease 1997  . Hearing loss   . Hepatic steatosis   . History of chicken pox   . History of migraine   . Hyperlipidemia   . Hypertriglyceridemia 06/21/2016  . Internal hemorrhoids   . Kidney stones   . Osteopenia 07/08/2016  . Osteoporosis    osteopenia  . Tubular adenoma of colon      Social History   Socioeconomic History  . Marital status: Married    Spouse name: Not on file  . Number of children: Not on file  . Years of education: Not on file  . Highest education level: Not on file  Occupational History  . Not on file  Tobacco Use  . Smoking status: Former Smoker    Packs/day: 0.25    Years: 15.00    Pack years: 3.75    Quit date: 01/16/1999    Years since quitting: 21.2  . Smokeless tobacco: Never Used  Substance and Sexual Activity  . Alcohol use: Not Currently    Alcohol/week: 0.0 standard drinks    Comment: none in 2 years as of 05/07/18  . Drug use: No  . Sexual activity: Yes    Birth control/protection: None  Other Topics Concern  . 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    Completed 12th grade   1 dog, 1 cat   Was raised by her grandmother.    Social Determinants of Health    Financial Resource Strain: Not on file  Food Insecurity: Not on file  Transportation Needs: Not on file  Physical Activity: Not on file  Stress: Not on file  Social Connections: Not on file  Intimate Partner Violence: Not on file    Past Surgical History:  Procedure Laterality Date  . APPENDECTOMY  2001  . COLONOSCOPY    . ELBOW SURGERY Right 2016  . ENDOMETRIAL ABLATION  2003  . HIP FRACTURE SURGERY Right 2009   fell playing with dog  . labrum repair Right 2011  . NASAL SINUS SURGERY     1998  . OOPHORECTOMY Right 2001   due to large ovarian cyst  . ovarian cysts     removed x 4 before the oophorectomy  . POLYPECTOMY    . THYROIDECTOMY  1997    Family History  Problem Relation Age of Onset  . Hyperlipidemia Father   . Hypertension Father   . Heart failure Father 72  . Diabetes Sister   . Arthritis Paternal Grandmother   . Kidney disease Paternal Grandmother   . Diabetes Paternal Grandmother   . Arthritis Paternal Grandfather   . Colon cancer Neg Hx   . Colon polyps Neg Hx   . Esophageal cancer Neg Hx   . Rectal cancer Neg Hx   . Stomach cancer Neg Hx   . Liver disease Neg Hx   . Inflammatory bowel disease Neg Hx  Allergies  Allergen Reactions  . Tramadol Itching    Current Outpatient Medications on File Prior to Visit  Medication Sig Dispense Refill  . amitriptyline (ELAVIL) 25 MG tablet Take 1 tablet (25 mg total) by mouth at bedtime. 90 tablet 1  . atorvastatin (LIPITOR) 20 MG tablet TAKE 1 TABLET DAILY 90 tablet 1  . cyclobenzaprine (FLEXERIL) 5 MG tablet Take 1 tablet (5 mg total) by mouth 3 (three) times daily as needed for muscle spasms. 20 tablet 1  . empagliflozin (JARDIANCE) 10 MG TABS tablet Take 1 tablet (10 mg total) by mouth daily. 30 tablet 5  . escitalopram (LEXAPRO) 10 MG tablet TAKE 1 TABLET DAILY. 90 tablet 1  . glucose blood (ONETOUCH VERIO) test strip Use as instructed to check blood sugar one to two times a day.  DX  E11.9 200  each 1  . methylPREDNISolone (MEDROL DOSEPAK) 4 MG TBPK tablet Take per package instructions. 21 tablet 0  . omeprazole (PRILOSEC) 40 MG capsule TAKE 1 CAPSULE DAILY 90 capsule 1  . OneTouch Delica Lancets 01B MISC Use as instructed to check blood sugar one to two times a day.  DX  E11.9 200 each 1  . pioglitazone (ACTOS) 30 MG tablet Take 1 tablet (30 mg total) by mouth daily. 90 tablet 1  . rOPINIRole (REQUIP) 0.5 MG tablet TAKE 1 TABLET BY MOUTH AT BEDTIME 30 tablet 0  . sitaGLIPtin (JANUVIA) 100 MG tablet Take 1 tablet (100 mg total) by mouth daily. 30 tablet 0  . SYNTHROID 100 MCG tablet TAKE 1 TABLET DAILY BEFORE BREAKFAST 90 tablet 0  . terbinafine (LAMISIL) 250 MG tablet Take 1 tablet (250 mg total) by mouth daily. 30 tablet 1   No current facility-administered medications on file prior to visit.    BP 122/89 (BP Location: Right Arm, Patient Position: Sitting, Cuff Size: Small)   Pulse 75   Temp 98.2 F (36.8 C) (Oral)   Resp 16   Ht 5\' 3"  (1.6 m)   Wt 141 lb 6.4 oz (64.1 kg)   LMP 01/26/2001   SpO2 98%   BMI 25.05 kg/m    Objective:   Physical Exam  Physical Exam  Constitutional: She is oriented to person, place, and time. She appears well-developed and well-nourished. No distress.  HENT:  Head: Normocephalic and atraumatic.  Right Ear: Tympanic membrane and ear canal normal.  Left Ear: Tympanic membrane and ear canal normal.  Mouth/Throat: No examined. Pt wearing mask Eyes: Pupils are equal, round, and reactive to light. No scleral icterus.  Neck: Normal range of motion. No thyromegaly present.  Cardiovascular: Normal rate and regular rhythm.   No murmur heard. Pulmonary/Chest: Effort normal and breath sounds normal. No respiratory distress. He has no wheezes. She has no rales. She exhibits no tenderness.  Abdominal: Soft. Bowel sounds are normal. She exhibits no distension and no mass. There is no tenderness. There is no rebound and no guarding.  Musculoskeletal:  She exhibits no edema.  Lymphadenopathy:    She has no cervical adenopathy.  Neurological: She is alert and oriented to person, place, and time. She has normal patellar reflexes. She exhibits normal muscle tone. Coordination normal.  Skin: Skin is warm and dry.  Psychiatric: She has a normal mood and affect. Her behavior is normal. Judgment and thought content normal.  Breast/pelvic: deferred to [place]:  Preventative care- discussed healthy diet and exercise. Td today. Recommended that she  obtain a covid booster.  Will refer to GYN for Pap at patient request.  Mammogram will be due after June 4.  Colonoscopy will be due next year.  Diabetes type 2-will obtain follow-up A1c.  Resume Januvia 100 mg p.o. daily in addition to Actos 30 mg once daily and Jardiance 10 mg once daily.  Insomnia-stable on Elavil 25 mg p.o. daily.  Hypothyroid-clinically stable on Synthroid 125 mcg.  Will obtain follow-up TSH.  Hypertriglyceridemia -tolerating Lipitor 20 mg once daily.  Will obtain follow-up lipid panel.  Continue same.  Depression-this is currently stable on Lexapro 10 mg p.o. daily she has had a lot of family stressors with her son who has drug abuse issues and custody issues with his child.  However she has been handling the stress very well.  Hearing loss-patient is requesting referral to audiology.   This visit occurred during the SARS-CoV-2 public health emergency.  Safety protocols were in place, including screening questions prior to the visit, additional usage of staff PPE, and extensive cleaning of exam room while observing appropriate contact time as indicated for disinfecting solutions.       Assessment & Plan:

## 2020-04-16 ENCOUNTER — Encounter: Payer: Self-pay | Admitting: Family

## 2020-04-16 ENCOUNTER — Telehealth: Payer: Self-pay | Admitting: Family

## 2020-04-16 DIAGNOSIS — E039 Hypothyroidism, unspecified: Secondary | ICD-10-CM

## 2020-04-16 MED ORDER — LEVOTHYROXINE SODIUM 125 MCG PO TABS
125.0000 ug | ORAL_TABLET | Freq: Every day | ORAL | 0 refills | Status: DC
Start: 2020-04-16 — End: 2020-06-03

## 2020-04-16 NOTE — Telephone Encounter (Signed)
Lab work shows that synthroid dose should be increased. I sent rx to her mail order for 125 mcg. Take before breakfast on empty stomach and separate from other meds by 30 minutes. Repeat TSH in 6 weeks.  Sugar is very high, (A1C 9.1).  I know she was out of her medicine for a little bit.  Please resume all medications and work hard on diabetic diet and exercise.

## 2020-04-18 ENCOUNTER — Other Ambulatory Visit: Payer: Self-pay | Admitting: Family

## 2020-04-18 NOTE — Telephone Encounter (Signed)
Patient advised of results and provider's advise.  She was scheduled for repeat TSH 06-03-2020

## 2020-04-20 ENCOUNTER — Encounter: Payer: Self-pay | Admitting: Family

## 2020-04-25 ENCOUNTER — Telehealth: Payer: Self-pay | Admitting: Family

## 2020-04-25 NOTE — Telephone Encounter (Signed)
Medical records release faxed to [facility]

## 2020-04-25 NOTE — Telephone Encounter (Signed)
Please call [phone] to request Eye exam.

## 2020-05-20 ENCOUNTER — Other Ambulatory Visit: Payer: Self-pay

## 2020-05-20 ENCOUNTER — Ambulatory Visit: Payer: BC Managed Care – PPO | Admitting: Audiologist

## 2020-06-03 ENCOUNTER — Telehealth: Payer: Self-pay | Admitting: Family

## 2020-06-03 ENCOUNTER — Other Ambulatory Visit: Payer: Self-pay

## 2020-06-03 ENCOUNTER — Other Ambulatory Visit (INDEPENDENT_AMBULATORY_CARE_PROVIDER_SITE_OTHER): Payer: BC Managed Care – PPO

## 2020-06-03 DIAGNOSIS — E039 Hypothyroidism, unspecified: Secondary | ICD-10-CM

## 2020-06-03 DIAGNOSIS — E1165 Type 2 diabetes mellitus with hyperglycemia: Secondary | ICD-10-CM

## 2020-06-03 LAB — TSH: TSH: 0.06 u[IU]/mL — ABNORMAL LOW (ref 0.35–4.50)

## 2020-06-03 MED ORDER — LEVOTHYROXINE SODIUM 112 MCG PO TABS
112.0000 ug | ORAL_TABLET | Freq: Every day | ORAL | 0 refills | Status: DC
Start: 2020-06-03 — End: 2020-07-23

## 2020-06-03 NOTE — Telephone Encounter (Signed)
Based on her results, I would like for her to decrease her synthroid to 112 mcg daily and repeat tsh in 6 weeks.

## 2020-06-03 NOTE — Telephone Encounter (Signed)
Patient advised of medication dose change and scheduled for 6 weeks TSH check with a 3 months A1c check on 07/22/20

## 2020-06-15 ENCOUNTER — Encounter: Payer: Self-pay | Admitting: Family

## 2020-06-15 DIAGNOSIS — L989 Disorder of the skin and subcutaneous tissue, unspecified: Secondary | ICD-10-CM

## 2020-06-22 ENCOUNTER — Encounter: Payer: Self-pay | Admitting: Family

## 2020-06-23 ENCOUNTER — Other Ambulatory Visit: Payer: Self-pay

## 2020-06-23 MED ORDER — ATORVASTATIN CALCIUM 20 MG PO TABS: 1.0000 | ORAL_TABLET | Freq: Every day | ORAL | 0 refills | Status: DC

## 2020-06-23 MED ORDER — AMITRIPTYLINE HCL 25 MG PO TABS: 25.0000 mg | ORAL_TABLET | Freq: Every day | ORAL | 1 refills | Status: DC

## 2020-07-22 ENCOUNTER — Other Ambulatory Visit: Payer: Self-pay

## 2020-07-22 ENCOUNTER — Other Ambulatory Visit (INDEPENDENT_AMBULATORY_CARE_PROVIDER_SITE_OTHER): Payer: BC Managed Care – PPO

## 2020-07-22 DIAGNOSIS — E1165 Type 2 diabetes mellitus with hyperglycemia: Secondary | ICD-10-CM

## 2020-07-22 DIAGNOSIS — E039 Hypothyroidism, unspecified: Secondary | ICD-10-CM | POA: Diagnosis not present

## 2020-07-22 LAB — HEMOGLOBIN A1C: Hgb A1c MFr Bld: 8.4 % — ABNORMAL HIGH (ref 4.6–6.5)

## 2020-07-22 LAB — TSH: TSH: 0.01 u[IU]/mL — ABNORMAL LOW (ref 0.35–5.50)

## 2020-07-23 ENCOUNTER — Telehealth: Payer: Self-pay | Admitting: Family

## 2020-07-23 DIAGNOSIS — E039 Hypothyroidism, unspecified: Secondary | ICD-10-CM

## 2020-07-23 MED ORDER — LEVOTHYROXINE SODIUM 88 MCG PO TABS: 88.0000 ug | ORAL_TABLET | Freq: Every day | ORAL | 1 refills | Status: DC

## 2020-07-23 MED ORDER — OZEMPIC (0.25 OR 0.5 MG/DOSE) 2 MG/1.5ML ~~LOC~~ SOPN: PEN_INJECTOR | SUBCUTANEOUS | 0 refills | Status: DC

## 2020-07-23 NOTE — Telephone Encounter (Signed)
See mychart.  

## 2020-07-25 ENCOUNTER — Telehealth: Payer: Self-pay | Admitting: *Deleted

## 2020-07-25 NOTE — Telephone Encounter (Signed)
Prior auth started for ozempic via cover my meds.  Awaiting determination.  Key: [id number]

## 2020-07-25 NOTE — Telephone Encounter (Signed)
PA Case: [id number], Status: Approved, Coverage Starts on: 07/25/2020 12:00:00 AM, Coverage Ends on: 07/25/2021 12:00:00 AM.

## 2020-07-29 ENCOUNTER — Other Ambulatory Visit: Payer: Self-pay

## 2020-07-29 ENCOUNTER — Ambulatory Visit (INDEPENDENT_AMBULATORY_CARE_PROVIDER_SITE_OTHER): Payer: BC Managed Care – PPO | Admitting: Family

## 2020-07-29 DIAGNOSIS — E1165 Type 2 diabetes mellitus with hyperglycemia: Secondary | ICD-10-CM | POA: Diagnosis not present

## 2020-07-29 NOTE — Progress Notes (Signed)
Patient here for diabetic teaching on how to use Ozempic pen.   Patient was shown how to use pen and also educational book given about diabetes.  Patient is to call if she has any side effects.

## 2020-08-13 ENCOUNTER — Encounter: Payer: Self-pay | Admitting: Family

## 2020-09-09 ENCOUNTER — Other Ambulatory Visit: Payer: Self-pay

## 2020-09-09 ENCOUNTER — Other Ambulatory Visit (INDEPENDENT_AMBULATORY_CARE_PROVIDER_SITE_OTHER): Payer: BC Managed Care – PPO

## 2020-09-09 ENCOUNTER — Encounter: Payer: Self-pay | Admitting: Family

## 2020-09-09 ENCOUNTER — Telehealth: Payer: Self-pay | Admitting: Family

## 2020-09-09 DIAGNOSIS — E039 Hypothyroidism, unspecified: Secondary | ICD-10-CM

## 2020-09-09 LAB — TSH: TSH: 0.11 u[IU]/mL — ABNORMAL LOW (ref 0.35–5.50)

## 2020-09-09 MED ORDER — LEVOTHYROXINE SODIUM 75 MCG PO TABS: 75.0000 ug | ORAL_TABLET | Freq: Every day | ORAL | 0 refills | Status: DC

## 2020-09-09 NOTE — Telephone Encounter (Signed)
Thyroid testing is improved but dose still a little too strong.   Decrease synthroid from 88 mcg to 75 mcg. Repeat tsh in 6 weeks, dx hypothyroid.

## 2020-09-12 MED ORDER — SITAGLIPTIN PHOSPHATE 100 MG PO TABS: 100.0000 mg | ORAL_TABLET | Freq: Every day | ORAL | 0 refills | Status: DC

## 2020-09-22 ENCOUNTER — Encounter: Payer: Self-pay | Admitting: Family

## 2020-10-03 ENCOUNTER — Other Ambulatory Visit: Payer: Self-pay | Admitting: Family

## 2020-10-11 ENCOUNTER — Other Ambulatory Visit: Payer: Self-pay | Admitting: Family

## 2020-10-11 ENCOUNTER — Encounter: Payer: Self-pay | Admitting: Family

## 2020-10-17 ENCOUNTER — Encounter: Payer: Self-pay | Admitting: Family

## 2020-10-17 DIAGNOSIS — E039 Hypothyroidism, unspecified: Secondary | ICD-10-CM

## 2020-10-18 MED ORDER — LEVOTHYROXINE SODIUM 75 MCG PO TABS: 75.0000 ug | ORAL_TABLET | Freq: Every day | ORAL | 0 refills | Status: DC

## 2020-11-25 ENCOUNTER — Ambulatory Visit: Payer: BC Managed Care – PPO | Admitting: Family

## 2020-11-25 ENCOUNTER — Other Ambulatory Visit: Payer: Self-pay

## 2020-11-25 VITALS — BP 120/83 | HR 101 | Temp 98.2°F | Resp 16 | Wt 127.0 lb

## 2020-11-25 DIAGNOSIS — E785 Hyperlipidemia, unspecified: Secondary | ICD-10-CM

## 2020-11-25 DIAGNOSIS — Z23 Encounter for immunization: Secondary | ICD-10-CM | POA: Diagnosis not present

## 2020-11-25 DIAGNOSIS — E119 Type 2 diabetes mellitus without complications: Secondary | ICD-10-CM

## 2020-11-25 DIAGNOSIS — E039 Hypothyroidism, unspecified: Secondary | ICD-10-CM

## 2020-11-25 DIAGNOSIS — K219 Gastro-esophageal reflux disease without esophagitis: Secondary | ICD-10-CM

## 2020-11-25 DIAGNOSIS — Z124 Encounter for screening for malignant neoplasm of cervix: Secondary | ICD-10-CM

## 2020-11-25 DIAGNOSIS — M1990 Unspecified osteoarthritis, unspecified site: Secondary | ICD-10-CM

## 2020-11-25 DIAGNOSIS — F419 Anxiety disorder, unspecified: Secondary | ICD-10-CM

## 2020-11-25 DIAGNOSIS — G2581 Restless legs syndrome: Secondary | ICD-10-CM

## 2020-11-25 LAB — COMPREHENSIVE METABOLIC PANEL
ALT: 24 U/L (ref 0–35)
AST: 17 U/L (ref 0–37)
Albumin: 4.6 g/dL (ref 3.5–5.2)
Alkaline Phosphatase: 119 U/L — ABNORMAL HIGH (ref 39–117)
BUN: 18 mg/dL (ref 6–23)
CO2: 30 mEq/L (ref 19–32)
Calcium: 9.4 mg/dL (ref 8.4–10.5)
Chloride: 101 mEq/L (ref 96–112)
Creatinine, Ser: 0.94 mg/dL (ref 0.40–1.20)
GFR: 66.55 mL/min (ref >60.00)
Glucose, Bld: 181 mg/dL — ABNORMAL HIGH (ref 70–99)
Potassium: 4.4 mEq/L (ref 3.5–5.1)
Sodium: 140 mEq/L (ref 135–145)
Total Bilirubin: 0.4 mg/dL (ref 0.2–1.2)
Total Protein: 6.9 g/dL (ref 6.0–8.3)

## 2020-11-25 LAB — TSH: TSH: 4.08 u[IU]/mL (ref 0.35–5.50)

## 2020-11-25 LAB — HEMOGLOBIN A1C: Hgb A1c MFr Bld: 8 % — ABNORMAL HIGH (ref 4.6–6.5)

## 2020-11-25 MED ORDER — PIOGLITAZONE HCL 30 MG PO TABS: 30.0000 mg | ORAL_TABLET | Freq: Every day | ORAL | 1 refills | Status: DC

## 2020-11-25 MED ORDER — EMPAGLIFLOZIN 10 MG PO TABS: 10.0000 mg | ORAL_TABLET | Freq: Every day | ORAL | 1 refills | Status: DC

## 2020-11-25 MED ORDER — ESCITALOPRAM OXALATE 10 MG PO TABS: 10.0000 mg | ORAL_TABLET | Freq: Every day | ORAL | 1 refills | Status: DC

## 2020-11-25 MED ORDER — SITAGLIPTIN PHOSPHATE 100 MG PO TABS: 100.0000 mg | ORAL_TABLET | Freq: Every day | ORAL | 1 refills | Status: DC

## 2020-11-25 MED ORDER — MELOXICAM 7.5 MG PO TABS: 7.5000 mg | ORAL_TABLET | Freq: Every day | ORAL | 2 refills | Status: DC

## 2020-11-25 MED ORDER — ATORVASTATIN CALCIUM 20 MG PO TABS: 20.0000 mg | ORAL_TABLET | Freq: Every day | ORAL | 1 refills | Status: DC

## 2020-11-25 MED ORDER — AMITRIPTYLINE HCL 25 MG PO TABS: 25.0000 mg | ORAL_TABLET | Freq: Every day | ORAL | 1 refills | Status: DC

## 2020-11-25 NOTE — Assessment & Plan Note (Addendum)
Trial of meloxicam 7.5mg  once daily as needed.

## 2020-11-25 NOTE — Assessment & Plan Note (Signed)
Stable with prn use of otc supplement. No longer needing requip.

## 2020-11-25 NOTE — Assessment & Plan Note (Signed)
Lab Results  Component Value Date   HGBA1C 8.4 (H) 07/22/2020   HGBA1C 9.1 (H) 04/15/2020   HGBA1C 7.2 (H) 07/24/2019   Lab Results  Component Value Date   MICROALBUR 1.8 04/03/2019   LDLCALC 76 04/03/2019   CREATININE 0.87 04/15/2020   Wt Readings from Last 3 Encounters:  11/25/20 127 lb (57.6 kg)  04/15/20 141 lb 6.4 oz (64.1 kg)  08/12/19 136 lb 12.8 oz (62.1 kg)

## 2020-11-25 NOTE — Assessment & Plan Note (Signed)
Lab Results  Component Value Date   TSH 0.11 (L) 09/09/2020

## 2020-11-25 NOTE — Assessment & Plan Note (Signed)
Stable. Will d/c protonix.

## 2020-11-25 NOTE — Assessment & Plan Note (Signed)
Lab Results  Component Value Date   CHOL 201 (H) 04/15/2020   HDL 43.90 04/15/2020   LDLCALC 76 04/03/2019   LDLDIRECT 106.0 04/15/2020   TRIG 354.0 (H) 04/15/2020   CHOLHDL 5 04/15/2020   Maintained onlipitor 20mg .

## 2020-11-25 NOTE — Assessment & Plan Note (Signed)
Reports symptoms are stable on lexapro 10mg  once daily.

## 2020-11-25 NOTE — Progress Notes (Signed)
Subjective:   By signing my name below, I, Lyric Barr-McArthur, attest that this documentation has been prepared under the direction and in the presence of Debbrah Alar, NP, 11/25/2020   Patient ID: Donna Anderson, female    DOB: [birth year], 60 y.o.   MRN: [id number]  Chief Complaint  Patient presents with   Diabetes    Here for follow up   Hypothyroidism    Here for follow up    HPI Patient is in today for an office visit.  Joint pain: She complains that her fingers, first and second toes bilaterally, and her left knee are in really bad pain. She notes that her fingers are beginning to form bumps on them in certain areas. Thyroid: She had her synthroid dosage decreased recently and notes no adverse reactions to this and has no complaints with the medication. She is compliant in taking 75 mcg Synthroid to help with her thyroid.  Blood sugar: She is requesting to have her A1C checked today. She has lost weight because she is working to cut out carbs and starches. She is compliant in taking 100 mg Januvia.  Lab Results  Component Value Date   HGBA1C 8.4 (H) 07/22/2020  Reflux: She has not been experiencing issues with her reflux. She was taking 40 mg Protonix to help with this but mentions she has not been needing to take it lately as her symptoms have subsided.  Restless leg: She notes an improvement in restless leg. She was using 0.5 mg Requip to help with this but notes it has not been present for a long period of time so she does not take it anymore.  Anxiety: She reports an improvement in her anxiety since beginning 10 mg Lexapro.  Vision: She notes that she is UTD on vision care and is waiting for optometrist office to send over the records.  Pap smear: Her last pap smear was performed on 06/15/2016 and results were normal. She mentions intense pain with her last pap smear which has made her avoid getting an updated one. She is interested in being referred to an OB/GYN to have  this performed.  Immunizations: She has received 2 Covid-19 vaccines but denies the updated Covid-19 booster at this time. She is requesting to have a flu shot in the office today.   Health Maintenance Due  Topic Date Due   Zoster Vaccines- Shingrix (1 of 2) Never done   OPHTHALMOLOGY EXAM  12/07/2018   Pneumococcal Vaccine 56-80 Years old (2 - PCV) 03/01/2019   PAP SMEAR-Modifier  06/16/2019   COVID-19 Vaccine (3 - Booster for Pfizer series) 06/16/2019   URINE MICROALBUMIN  04/02/2020    Past Medical History:  Diagnosis Date   Allergy    Anemia    Anxiety    Aortic atherosclerosis ([place])    Arthritis    Depression    Diabetes type 2, controlled ([place]) 06/21/2016   Diverticulosis 2015   CT SCAN    GERD (gastroesophageal reflux disease)    Graves disease 1997   Hearing loss    Hepatic steatosis    History of chicken pox    History of migraine    Hyperlipidemia    Hypertriglyceridemia 06/21/2016   Internal hemorrhoids    Kidney stones    Osteopenia 07/08/2016   Osteoporosis    osteopenia   Tubular adenoma of colon     Past Surgical History:  Procedure Laterality Date   APPENDECTOMY  2001   COLONOSCOPY  ELBOW SURGERY Right 2016   ENDOMETRIAL ABLATION  2003   HIP FRACTURE SURGERY Right 2009   fell playing with dog   labrum repair Right 2011   [place] Right 2001   due to large ovarian cyst   ovarian cysts     removed x 4 before the oophorectomy   POLYPECTOMY     THYROIDECTOMY  1997    Family History  Problem Relation Age of Onset   Hyperlipidemia Father    Hypertension Father    Heart failure Father 70   Diabetes Sister    Arthritis Paternal Grandmother    Kidney disease Paternal Grandmother    Diabetes Paternal Grandmother    Arthritis Paternal Grandfather    Hepatitis C Son    Arthritis Son    Drug abuse Son    Colon cancer Neg Hx    Colon polyps Neg Hx    Esophageal cancer Neg Hx    Rectal cancer Neg Hx     Stomach cancer Neg Hx    Liver disease Neg Hx    Inflammatory bowel disease Neg Hx     Social History   Socioeconomic History   Marital status: Married    Spouse name: Not on file   Number of children: Not on file   Years of education: Not on file   Highest education level: Not on file  Occupational History   Not on file  Tobacco Use   Smoking status: Former    Packs/day: 0.25    Years: 15.00    Pack years: 3.75    Types: Cigarettes    Quit date: 01/16/1999    Years since quitting: 21.8   Smokeless tobacco: Never  Substance and Sexual Activity   Alcohol use: Not Currently    Alcohol/week: 0.0 standard drinks    Comment: none in 2 years as of 05/07/18   Drug use: No   Sexual activity: Yes    Birth control/protection: None  Other Topics Concern   Not on file  Social History Narrative   Married (second marriage) has one son in [place]   Worked in Psychologist, 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    Completed 12th grade   1 dog, 1 cat   Was raised by her grandmother.    Social Determinants of Health   Financial Resource Strain: Not on file  Food Insecurity: Not on file  Transportation Needs: Not on file  Physical Activity: Not on file  Stress: Not on file  Social Connections: Not on file  Intimate Partner Violence: Not on file    Outpatient Medications Prior to Visit  Medication Sig Dispense Refill   levothyroxine (SYNTHROID) 75 MCG tablet Take 1 tablet (75 mcg total) by mouth daily. 90 tablet 0   amitriptyline (ELAVIL) 25 MG tablet Take 1 tablet (25 mg total) by mouth at bedtime. 90 tablet 1   atorvastatin (LIPITOR) 20 MG tablet TAKE 1 TABLET DAILY 90 tablet 0   cyclobenzaprine (FLEXERIL) 5 MG tablet Take 1 tablet (5 mg total) by mouth 3 (three) times daily as needed for muscle spasms. 20 tablet 1   empagliflozin (JARDIANCE) 10 MG TABS tablet Take 1 tablet (10 mg total) by mouth daily. 30 tablet 5    escitalopram (LEXAPRO) 10 MG tablet TAKE 1 TABLET DAILY 90 tablet 0   pantoprazole (PROTONIX) 40 MG tablet Take 1 tablet (40  mg total) by mouth daily. 90 tablet 1   pioglitazone (ACTOS) 30 MG tablet Take 1 tablet (30 mg total) by mouth daily. 90 tablet 1   rOPINIRole (REQUIP) 0.5 MG tablet TAKE 1 TABLET BY MOUTH AT BEDTIME 30 tablet 0   sitaGLIPtin (JANUVIA) 100 MG tablet Take 1 tablet (100 mg total) by mouth daily. 90 tablet 0   glucose blood (ONETOUCH VERIO) test strip Use as instructed to check blood sugar one to two times a day.  DX  E11.9 200 each 1   OneTouch Delica Lancets 33G MISC Use as instructed to check blood sugar one to two times a day.  DX  E11.9 200 each 1   No facility-administered medications prior to visit.    Allergies  Allergen Reactions   Tramadol Itching    Review of Systems  Gastrointestinal:  Negative for heartburn.  Musculoskeletal:  Positive for joint pain (In fingers, toes, and left knee).  Neurological:        (-) restless leg      Objective:    Physical Exam Constitutional:      General: She is not in acute distress.    Appearance: Normal appearance. She is not ill-appearing.  HENT:     Head: Normocephalic and atraumatic.     Right Ear: External ear normal.     Left Ear: External ear normal.  Eyes:     Extraocular Movements: Extraocular movements intact.     Pupils: Pupils are equal, round, and reactive to light.  Cardiovascular:     Rate and Rhythm: Normal rate and regular rhythm.     Pulses:          Dorsalis pedis pulses are 2+ on the right side and 2+ on the left side.       Posterior tibial pulses are 2+ on the right side and 2+ on the left side.     Heart sounds: Normal heart sounds. No murmur heard.   No gallop.  Pulmonary:     Effort: Pulmonary effort is normal. No respiratory distress.     Breath sounds: Normal breath sounds. No wheezing or rales.  Feet:     Comments: Diabetic Foot Exam - Simple   Simple Foot Form Diabetic Foot  exam was performed with the following findings: Yes  11/25/2020 11:18 AM  Visual Inspection No deformities, no ulcerations, no other skin breakdown bilaterally: Yes Sensation Testing Intact to touch and monofilament testing bilaterally: Yes Pulse Check Posterior Tibialis and Dorsalis pulse intact bilaterally: Yes Comments  Lymphadenopathy:     Cervical: No cervical adenopathy.  Skin:    General: Skin is warm and dry.  Neurological:     Mental Status: She is alert and oriented to person, place, and time.  Psychiatric:        Behavior: Behavior normal.        Judgment: Judgment normal.   Diabetic Foot Exam - Simple   Simple Foot Form Diabetic Foot exam was performed with the following findings: Yes 11/25/2020 11:18 AM  Visual Inspection No deformities, no ulcerations, no other skin breakdown bilaterally: Yes Sensation Testing Intact to touch and monofilament testing bilaterally: Yes Pulse Check Posterior Tibialis and Dorsalis pulse intact bilaterally: Yes Comments      BP 120/83 (BP Location: Right Arm, Patient Position: Sitting, Cuff Size: Small)   Pulse (!) 101   Temp 98.2 F (36.8 C) (Oral)   Resp 16   Wt 127 lb (57.6 kg)   LMP 01/26/2001  SpO2 99%   BMI 22.50 kg/m  Wt Readings from Last 3 Encounters:  11/25/20 127 lb (57.6 kg)  04/15/20 141 lb 6.4 oz (64.1 kg)  08/12/19 136 lb 12.8 oz (62.1 kg)       Assessment & Plan:   Problem List Items Addressed This Visit       Unprioritized   Restless leg syndrome    Stable with prn use of otc supplement. No longer needing requip.       Hypothyroidism    Lab Results  Component Value Date   TSH 0.11 (L) 09/09/2020        Hyperlipidemia    Lab Results  Component Value Date   CHOL 201 (H) 04/15/2020   HDL 43.90 04/15/2020   LDLCALC 76 04/03/2019   LDLDIRECT 106.0 04/15/2020   TRIG 354.0 (H) 04/15/2020   CHOLHDL 5 04/15/2020  Maintained onlipitor 20mg .        Relevant Medications   atorvastatin  (LIPITOR) 20 MG tablet   GERD (gastroesophageal reflux disease)    Stable. Will d/c protonix.       Diabetes mellitus type 2 in nonobese [facility])    Lab Results  Component Value Date   HGBA1C 8.4 (H) 07/22/2020   HGBA1C 9.1 (H) 04/15/2020   HGBA1C 7.2 (H) 07/24/2019   Lab Results  Component Value Date   MICROALBUR 1.8 04/03/2019   LDLCALC 76 04/03/2019   CREATININE 0.87 04/15/2020   Wt Readings from Last 3 Encounters:  11/25/20 127 lb (57.6 kg)  04/15/20 141 lb 6.4 oz (64.1 kg)  08/12/19 136 lb 12.8 oz (62.1 kg)        Relevant Medications   atorvastatin (LIPITOR) 20 MG tablet   empagliflozin (JARDIANCE) 10 MG TABS tablet   pioglitazone (ACTOS) 30 MG tablet   sitaGLIPtin (JANUVIA) 100 MG tablet   Other Relevant Orders   Hemoglobin A1c   Comp Met (CMET)   Urine Microalbumin w/creat. ratio   Anxiety    Reports symptoms are stable on lexapro 10mg  once daily.       Relevant Medications   amitriptyline (ELAVIL) 25 MG tablet   escitalopram (LEXAPRO) 10 MG tablet   Other Visit Diagnoses     Needs flu shot    -  Primary   Relevant Orders   Flu Vaccine QUAD 6+ mos PF IM (Fluarix Quad PF) (Completed)   Screening for cervical cancer       Relevant Orders   Ambulatory referral to Obstetrics / Gynecology      Meds ordered this encounter  Medications   meloxicam (MOBIC) 7.5 MG tablet    Sig: Take 1 tablet (7.5 mg total) by mouth daily.    Dispense:  30 tablet    Refill:  2    Order Specific Question:   Supervising Provider    Answer:   Penni Homans A [[id number]]   amitriptyline (ELAVIL) 25 MG tablet    Sig: Take 1 tablet (25 mg total) by mouth at bedtime.    Dispense:  90 tablet    Refill:  1    Order Specific Question:   Supervising Provider    Answer:   Penni Homans A [[id number]]   atorvastatin (LIPITOR) 20 MG tablet    Sig: Take 1 tablet (20 mg total) by mouth daily.    Dispense:  90 tablet    Refill:  1    Order Specific Question:   Supervising Provider    Answer:    Penni Homans A [  [id number]]   empagliflozin (JARDIANCE) 10 MG TABS tablet    Sig: Take 1 tablet (10 mg total) by mouth daily.    Dispense:  90 tablet    Refill:  1    Order Specific Question:   Supervising Provider    Answer:   Penni Homans A [[id number]]   escitalopram (LEXAPRO) 10 MG tablet    Sig: Take 1 tablet (10 mg total) by mouth daily.    Dispense:  90 tablet    Refill:  1    Order Specific Question:   Supervising Provider    Answer:   Penni Homans A [[id number]]   pioglitazone (ACTOS) 30 MG tablet    Sig: Take 1 tablet (30 mg total) by mouth daily.    Dispense:  90 tablet    Refill:  1    Order Specific Question:   Supervising Provider    Answer:   Penni Homans A [[id number]]   sitaGLIPtin (JANUVIA) 100 MG tablet    Sig: Take 1 tablet (100 mg total) by mouth daily.    Dispense:  90 tablet    Refill:  1    Order Specific Question:   Supervising Provider    Answer:   Penni Homans A [[id number]]    I, Debbrah Alar, NP, personally preformed the services described in this documentation.  All medical record entries made by the scribe were at my direction and in my presence.  I have reviewed the chart and discharge instructions (if applicable) and agree that the record reflects my personal performance and is accurate and complete. 11/25/2020  I,Lyric Barr-McArthur,acting as a Education administrator for Nance Pear, NP.,have documented all relevant documentation on the behalf of Nance Pear, NP,as directed by  Nance Pear, NP while in the presence of Nance Pear, NP.  Nance Pear, NP

## 2020-11-27 ENCOUNTER — Telehealth: Payer: Self-pay | Admitting: Family

## 2020-11-27 MED ORDER — GLIPIZIDE ER 5 MG PO TB24: 5.0000 mg | ORAL_TABLET | Freq: Every day | ORAL | 0 refills | Status: DC

## 2020-11-27 NOTE — Telephone Encounter (Signed)
See mychart.  

## 2020-11-27 NOTE — Addendum Note (Signed)
Addended by: Debbrah Alar on: 11/27/2020 03:44 PM   Modules accepted: Orders

## 2020-11-28 ENCOUNTER — Other Ambulatory Visit: Payer: Self-pay | Admitting: Family

## 2020-11-28 MED ORDER — LEVOTHYROXINE SODIUM 75 MCG PO TABS: 75.0000 ug | ORAL_TABLET | Freq: Every day | ORAL | 1 refills | Status: DC

## 2020-11-29 ENCOUNTER — Telehealth: Payer: Self-pay | Admitting: *Deleted

## 2020-11-29 NOTE — Telephone Encounter (Signed)
PA Case: [id number], Status: Approved, Coverage Starts on: 11/29/2020 12:00:00 AM, Coverage Ends on: 11/29/2021 12:00:00 AM

## 2020-11-29 NOTE — Telephone Encounter (Signed)
Prior auth started via cover my meds.  Awaiting determination.  Key: [place]

## 2020-11-30 ENCOUNTER — Telehealth: Payer: Self-pay | Admitting: Radiology

## 2020-11-30 NOTE — Telephone Encounter (Signed)
eft message for patient to call [facility] to schedule New GYN- PAP from referral

## 2020-12-01 ENCOUNTER — Encounter: Payer: Self-pay | Admitting: Family

## 2020-12-01 ENCOUNTER — Other Ambulatory Visit: Payer: Self-pay

## 2020-12-01 MED ORDER — SITAGLIPTIN PHOSPHATE 100 MG PO TABS: 100.0000 mg | ORAL_TABLET | Freq: Every day | ORAL | 1 refills | Status: DC

## 2020-12-01 MED ORDER — PIOGLITAZONE HCL 30 MG PO TABS: 30.0000 mg | ORAL_TABLET | Freq: Every day | ORAL | 1 refills | Status: DC

## 2020-12-01 MED ORDER — GLIPIZIDE ER 5 MG PO TB24: 5.0000 mg | ORAL_TABLET | Freq: Every day | ORAL | 0 refills | Status: DC

## 2020-12-01 MED ORDER — EMPAGLIFLOZIN 10 MG PO TABS: 10.0000 mg | ORAL_TABLET | Freq: Every day | ORAL | 1 refills | Status: DC

## 2020-12-02 NOTE — Telephone Encounter (Signed)
Spoke to patient and rx sent to [facility]

## 2020-12-09 ENCOUNTER — Other Ambulatory Visit: Payer: Self-pay | Admitting: Family

## 2020-12-22 ENCOUNTER — Encounter: Payer: BC Managed Care – PPO | Admitting: Obstetrics and Gynecology

## 2020-12-26 ENCOUNTER — Other Ambulatory Visit: Payer: Self-pay

## 2020-12-26 ENCOUNTER — Ambulatory Visit: Payer: BC Managed Care – PPO | Admitting: Dermatology

## 2020-12-26 ENCOUNTER — Encounter: Payer: Self-pay | Admitting: Dermatology

## 2020-12-26 DIAGNOSIS — Z1283 Encounter for screening for malignant neoplasm of skin: Secondary | ICD-10-CM | POA: Diagnosis not present

## 2020-12-26 DIAGNOSIS — D1801 Hemangioma of skin and subcutaneous tissue: Secondary | ICD-10-CM | POA: Diagnosis not present

## 2020-12-26 DIAGNOSIS — Z8639 Personal history of other endocrine, nutritional and metabolic disease: Secondary | ICD-10-CM

## 2020-12-26 DIAGNOSIS — Q825 Congenital non-neoplastic nevus: Secondary | ICD-10-CM

## 2021-01-11 ENCOUNTER — Encounter: Payer: Self-pay | Admitting: Dermatology

## 2021-01-11 NOTE — Progress Notes (Signed)
° °  Follow-Up Visit   Subjective  Donna Anderson is a 60 y.o. female who presents for the following: Annual Exam (Patient has a lesion on right hand x 2 years, no itching or bleeding. Lesion on right leg x year and no itching or bleeding. ).  General skin examination Location:  Duration:  Quality:  Associated Signs/Symptoms: Modifying Factors:  Severity:  Timing: Context:   Objective  Well appearing patient in no apparent distress; mood and affect are within normal limits. Full body skin check; no atypical pigmented lesions or nonmelanoma skin cancer.  Abdomen (Lower Torso, Anterior), Mid Back, Torso - Posterior (Back) Multiple smooth red two millimeter dermal papules    A full examination was performed including scalp, head, eyes, ears, nose, lips, neck, chest, axillae, abdomen, back, buttocks, bilateral upper extremities, bilateral lower extremities, hands, feet, fingers, toes, fingernails, and toenails. All findings within normal limits unless otherwise noted below.  Areas beneath undergarments not fully examined.   Assessment & Plan    Screening exam for skin cancer  Yearly skin check, encouraged to self examine with spouse twice annually.  Cherry angioma (3) Abdomen (Lower Torso, Anterior); Torso - Posterior (Back); Mid Back  Benign, ok to leave       I, Lavonna Monarch, MD, have reviewed all documentation for this visit.  The documentation on 01/11/21 for the exam, diagnosis, procedures, and orders are all accurate and complete.

## 2021-03-03 ENCOUNTER — Other Ambulatory Visit: Payer: Self-pay | Admitting: Family

## 2021-03-03 ENCOUNTER — Telehealth: Payer: Self-pay | Admitting: Family

## 2021-03-03 ENCOUNTER — Ambulatory Visit: Payer: BC Managed Care – PPO | Admitting: Family

## 2021-03-03 VITALS — BP 117/69 | HR 90 | Temp 98.5°F | Resp 16 | Wt 137.0 lb

## 2021-03-03 DIAGNOSIS — Z23 Encounter for immunization: Secondary | ICD-10-CM | POA: Diagnosis not present

## 2021-03-03 DIAGNOSIS — E785 Hyperlipidemia, unspecified: Secondary | ICD-10-CM | POA: Diagnosis not present

## 2021-03-03 DIAGNOSIS — R1902 Left upper quadrant abdominal swelling, mass and lump: Secondary | ICD-10-CM

## 2021-03-03 DIAGNOSIS — G4700 Insomnia, unspecified: Secondary | ICD-10-CM

## 2021-03-03 DIAGNOSIS — E039 Hypothyroidism, unspecified: Secondary | ICD-10-CM

## 2021-03-03 DIAGNOSIS — E119 Type 2 diabetes mellitus without complications: Secondary | ICD-10-CM

## 2021-03-03 LAB — LIPID PANEL
Cholesterol: 176 mg/dL (ref 0–200)
HDL: 51.7 mg/dL (ref >39.00)
LDL Cholesterol: 105 mg/dL — ABNORMAL HIGH (ref 0–99)
NonHDL: 124.03
Total CHOL/HDL Ratio: 3
Triglycerides: 93 mg/dL (ref 0.0–149.0)
VLDL: 18.6 mg/dL (ref 0.0–40.0)

## 2021-03-03 LAB — COMPREHENSIVE METABOLIC PANEL
ALT: 27 U/L (ref 0–35)
AST: 19 U/L (ref 0–37)
Albumin: 4.4 g/dL (ref 3.5–5.2)
Alkaline Phosphatase: 101 U/L (ref 39–117)
BUN: 17 mg/dL (ref 6–23)
CO2: 33 mEq/L — ABNORMAL HIGH (ref 19–32)
Calcium: 8.9 mg/dL (ref 8.4–10.5)
Chloride: 104 mEq/L (ref 96–112)
Creatinine, Ser: 0.88 mg/dL (ref 0.40–1.20)
GFR: 71.9 mL/min (ref >60.00)
Glucose, Bld: 142 mg/dL — ABNORMAL HIGH (ref 70–99)
Potassium: 5.1 mEq/L (ref 3.5–5.1)
Sodium: 142 mEq/L (ref 135–145)
Total Bilirubin: 0.7 mg/dL (ref 0.2–1.2)
Total Protein: 6.4 g/dL (ref 6.0–8.3)

## 2021-03-03 LAB — HEMOGLOBIN A1C: Hgb A1c MFr Bld: 7.3 % — ABNORMAL HIGH (ref 4.6–6.5)

## 2021-03-03 NOTE — Assessment & Plan Note (Signed)
Stable on Elavil. Continue same.

## 2021-03-03 NOTE — Telephone Encounter (Signed)
Records release faxed to [facility] in [place]

## 2021-03-03 NOTE — Progress Notes (Signed)
Subjective:     Patient ID: Donna Anderson, female    DOB: November 06, 1960, 61 y.o.   MRN: [id number]  Chief Complaint  Patient presents with   Diabetes    Here for follow up    Diabetes  Patient is in today for routine follow up.  DM2- maintained on glipizide/jartiance, januvia. Reports sugar 99-120's.  Max 130's.   Lab Results  Component Value Date   HGBA1C 8.0 (H) 11/25/2020   HGBA1C 8.4 (H) 07/22/2020   HGBA1C 9.1 (H) 04/15/2020   Lab Results  Component Value Date   MICROALBUR 1.8 04/03/2019   LDLCALC 76 04/03/2019   CREATININE 0.94 11/25/2020   Lab Results  Component Value Date   TSH 4.08 11/25/2020    Hyperlipidemia- maintained on lipitor 63m.  Lab Results  Component Value Date   CHOL 201 (H) 04/15/2020   HDL 43.90 04/15/2020   LDLCALC 76 04/03/2019   LDLDIRECT 106.0 04/15/2020   TRIG 354.0 (H) 04/15/2020   CHOLHDL 5 04/15/2020   Depression- maintained on lexapro.    Insomnia- on elavil HS.  Sleeping well, continues amitryptylline.    Notes that she noticed a bulge in the LUQ area, tenderness when she pushes on it. 3 weeks ago she got injured at work and has been having some right anterior rib pain. Her workman's comp dr. GHanley Seamenher robaxin. She did not tell them about the LUQ bulge because she did not notice it at that time.  Pap is scheduled for June.   Health Maintenance Due  Topic Date Due   Zoster Vaccines- Shingrix (1 of 2) Never done   OPHTHALMOLOGY EXAM  12/07/2018   URINE MICROALBUMIN  04/02/2020    Past Medical History:  Diagnosis Date   Allergy    Anemia    Anxiety    Aortic atherosclerosis ([place]    Arthritis    Depression    Diabetes type 2, controlled ([place] 06/21/2016   Diverticulosis 2015   CT SCAN    GERD (gastroesophageal reflux disease)    Graves disease 1997   Hearing loss    Hepatic steatosis    History of chicken pox    History of migraine    Hyperlipidemia    Hypertriglyceridemia 06/21/2016   Internal hemorrhoids     Kidney stones    Osteopenia 07/08/2016   Osteoporosis    osteopenia   Tubular adenoma of colon     Past Surgical History:  Procedure Laterality Date   APPENDECTOMY  2001   COLONOSCOPY     ELBOW SURGERY Right 2016   ENDOMETRIAL ABLATION  2003   HIP FRACTURE SURGERY Right 2009   fell playing with dog   [place]   due to large ovarian cyst   ovarian cysts     removed x 4 before the oophorectomy   POLYPECTOMY     THYROIDECTOMY  1997    Family History  Problem Relation Age of Onset   Hyperlipidemia Father    Hypertension Father    Heart failure Father 75  Diabetes Sister    Arthritis Paternal Grandmother    Kidney disease Paternal Grandmother    Diabetes Paternal Grandmother    Arthritis Paternal Grandfather    Hepatitis C Son    Arthritis Son    Drug abuse Son    Colon cancer Neg Hx    Colon polyps Neg Hx  Esophageal cancer Neg Hx    Rectal cancer Neg Hx    Stomach cancer Neg Hx    Liver disease Neg Hx    Inflammatory bowel disease Neg Hx     Social History   Socioeconomic History   Marital status: Married    Spouse name: Not on file   Number of children: Not on file   Years of education: Not on file   Highest education level: Not on file  Occupational History   Not on file  Tobacco Use   Smoking status: Former    Packs/day: 0.25    Years: 15.00    Pack years: 3.75    Types: Cigarettes    Quit date: 01/16/1999    Years since quitting: 22.1   Smokeless tobacco: Never  Vaping Use   Vaping Use: Never used  Substance and Sexual Activity   Alcohol use: Not Currently    Alcohol/week: 0.0 standard drinks    Comment: none in 2 years as of 05/07/18   Drug use: No   Sexual activity: Yes    Birth control/protection: None  Other Topics Concern   Not on file  Social History Narrative   Married (second marriage) has one son in [place]   Worked in Psychologist, 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    Completed 12th grade   1 dog, 1 cat   Was raised by her grandmother.    Social Determinants of Health   Financial Resource Strain: Not on file  Food Insecurity: Not on file  Transportation Needs: Not on file  Physical Activity: Not on file  Stress: Not on file  Social Connections: Not on file  Intimate Partner Violence: Not on file    Outpatient Medications Prior to Visit  Medication Sig Dispense Refill   amitriptyline (ELAVIL) 25 MG tablet TAKE 1 TABLET AT BEDTIME 90 tablet 1   atorvastatin (LIPITOR) 20 MG tablet Take 1 tablet (20 mg total) by mouth daily. 90 tablet 1   empagliflozin (JARDIANCE) 10 MG TABS tablet Take 1 tablet (10 mg total) by mouth daily. 90 tablet 1   escitalopram (LEXAPRO) 10 MG tablet Take 1 tablet (10 mg total) by mouth daily. 90 tablet 1   glipiZIDE (GLUCOTROL XL) 5 MG 24 hr tablet Take 1 tablet (5 mg total) by mouth daily with breakfast. 90 tablet 0   levothyroxine (SYNTHROID) 75 MCG tablet Take 1 tablet (75 mcg total) by mouth daily. 90 tablet 1   meloxicam (MOBIC) 7.5 MG tablet Take 1 tablet (7.5 mg total) by mouth daily. 30 tablet 2   pioglitazone (ACTOS) 30 MG tablet Take 1 tablet (30 mg total) by mouth daily. 90 tablet 1   sitaGLIPtin (JANUVIA) 100 MG tablet Take 1 tablet (100 mg total) by mouth daily. 90 tablet 1   No facility-administered medications prior to visit.    Allergies  Allergen Reactions   Tramadol Itching    ROS See HPI    Objective:    Physical Exam Constitutional:      General: She is not in acute distress.    Appearance: Normal appearance. She is well-developed.  HENT:     Head: Normocephalic and atraumatic.     Right Ear: External ear normal.     Left Ear: External ear normal.  Eyes:     General: No scleral icterus. Neck:     Thyroid: No thyromegaly.  Cardiovascular:     Rate  and Rhythm: Normal rate and regular rhythm.     Heart sounds: Normal  heart sounds. No murmur heard. Pulmonary:     Effort: Pulmonary effort is normal. No respiratory distress.     Breath sounds: Normal breath sounds. No wheezing.  Abdominal:     Comments: Bulge noted LUQ, partially reducible  Musculoskeletal:     Cervical back: Neck supple.  Skin:    General: Skin is warm and dry.  Neurological:     Mental Status: She is alert and oriented to person, place, and time.  Psychiatric:        Mood and Affect: Mood normal.        Behavior: Behavior normal.        Thought Content: Thought content normal.        Judgment: Judgment normal.    BP 117/69 (BP Location: Right Arm, Patient Position: Sitting, Cuff Size: Small)    Pulse 90    Temp 98.5 F (36.9 C) (Oral)    Resp 16    Wt 137 lb (62.1 kg)    LMP 01/26/2001    SpO2 97%    BMI 24.27 kg/m  Wt Readings from Last 3 Encounters:  03/03/21 137 lb (62.1 kg)  11/25/20 127 lb (57.6 kg)  04/15/20 141 lb 6.4 oz (64.1 kg)       Assessment & Plan:   Problem List Items Addressed This Visit       Unprioritized   Insomnia    Stable on Elavil. Continue same.       Hypothyroidism    Clinically stable on synthroid. Continue same.       Hyperlipidemia    Last lipids were elevated. Pt is fasting. Obtain follow up lipid panel. Continue statin.   Shingrix #1.       Relevant Orders   Comp Met (CMET)   Lipid panel   Diabetes mellitus type 2 in nonobese ([facility])    Stable on current regimen. Continue same. Obtain follow up A1C.       Relevant Orders   Hemoglobin A1c   Other Visit Diagnoses     Left upper quadrant abdominal mass    -  Primary   Relevant Orders   CT Abdomen Pelvis W Contrast       I am having Donna Anderson maintain her meloxicam, atorvastatin, escitalopram, levothyroxine, sitaGLIPtin, pioglitazone, glipiZIDE, empagliflozin, and amitriptyline.  No orders of the defined types were placed in this encounter.

## 2021-03-03 NOTE — Telephone Encounter (Signed)
Please call [facility] In [place] to request DM eye exam.

## 2021-03-03 NOTE — Assessment & Plan Note (Signed)
Clinically stable on synthroid. Continue same.  

## 2021-03-03 NOTE — Patient Instructions (Signed)
Please complete lab work prior to leaving.   

## 2021-03-03 NOTE — Assessment & Plan Note (Signed)
Stable on current regimen. Continue same. Obtain follow up A1C.

## 2021-03-03 NOTE — Assessment & Plan Note (Signed)
Last lipids were elevated. Pt is fasting. Obtain follow up lipid panel. Continue statin.   Shingrix #1.

## 2021-03-06 MED ORDER — GLIPIZIDE ER 5 MG PO TB24: 5.0000 mg | ORAL_TABLET | Freq: Every day | ORAL | 0 refills | Status: DC

## 2021-03-08 ENCOUNTER — Encounter: Payer: Self-pay | Admitting: *Deleted

## 2021-03-10 ENCOUNTER — Other Ambulatory Visit: Payer: Self-pay

## 2021-03-10 ENCOUNTER — Encounter (HOSPITAL_BASED_OUTPATIENT_CLINIC_OR_DEPARTMENT_OTHER): Payer: Self-pay

## 2021-03-10 ENCOUNTER — Ambulatory Visit (HOSPITAL_BASED_OUTPATIENT_CLINIC_OR_DEPARTMENT_OTHER)
Admission: RE | Admit: 2021-03-10 | Discharge: 2021-03-10 | Disposition: A | Discharge status: Home / Self Care | Payer: BC Managed Care – PPO | Source: Ambulatory Visit | Attending: Family | Admitting: Family

## 2021-03-10 DIAGNOSIS — R1902 Left upper quadrant abdominal swelling, mass and lump: Secondary | ICD-10-CM | POA: Diagnosis not present

## 2021-03-10 DIAGNOSIS — K760 Fatty (change of) liver, not elsewhere classified: Secondary | ICD-10-CM | POA: Diagnosis not present

## 2021-03-10 DIAGNOSIS — I700 Atherosclerosis of aorta: Secondary | ICD-10-CM | POA: Diagnosis not present

## 2021-03-10 MED ORDER — IOHEXOL 300 MG/ML  SOLN
100.0000 mL | Freq: Once | INTRAMUSCULAR | Status: AC | PRN
  Administered 2021-03-10: 100 mL via INTRAVENOUS

## 2021-03-14 ENCOUNTER — Telehealth: Payer: Self-pay | Admitting: Family

## 2021-03-14 DIAGNOSIS — I728 Aneurysm of other specified arteries: Secondary | ICD-10-CM | POA: Insufficient documentation

## 2021-03-14 NOTE — Telephone Encounter (Signed)
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 Pt verbalizes understanding and is agreeable to referral.

## 2021-03-14 NOTE — Telephone Encounter (Signed)
noted 

## 2021-03-20 ENCOUNTER — Encounter: Payer: Self-pay | Admitting: Family

## 2021-03-30 NOTE — Progress Notes (Signed)
?Office Note  ? ? ? ?CC:  Splenic artery aneurysm  ?Requesting Provider:  Debbrah Alar, NP ? ?HPI: Donna Anderson is a 61 y.o. 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  [place] native, she moved to [place] 7 years ago for her husband's job.  She plans to see her long-term as her 55 year old son and 17-year-old grandchild are also in the area. ? ?On exam, Donna Anderson was doing well.  She denied abdominal pain, back pain, flank pain.  Several weeks ago she injured her right flank, keeping her out of work.  Also during that time, she appreciated some left-sided superficial flank tenderness along her rib cage.  Dr. Inda Castle was concerned about possible hernia, therefore a CT scan was ordered.  The CT scan demonstrated splenic artery aneurysm. ? ?Donna Anderson denies history of claudication, rest pain, tissue loss in the feet, she is active. ?No aneurysm history. ? ?The pt is  on a statin for cholesterol management.  ?The pt is not on a daily aspirin.   Other AC:  - ?The pt is not on medication for hypertension.   ?The pt is  diabetic.  ?Tobacco hx:  - ? ?Past Medical History:  ?Diagnosis Date  ? Allergy   ? Anemia   ? Anxiety   ? Aortic atherosclerosis ([place])   ? Arthritis   ? Depression   ? Diabetes type 2, controlled ([place]) 06/21/2016  ? Diverticulosis 2015  ? CT SCAN   ? GERD (gastroesophageal reflux disease)   ? Graves disease 1997  ? Hearing loss   ? Hepatic steatosis   ? History of chicken pox   ? History of migraine   ? Hyperlipidemia   ? Hypertriglyceridemia 06/21/2016  ? Internal hemorrhoids   ? Kidney stones   ? Osteopenia 07/08/2016  ? Osteoporosis   ? osteopenia  ? Tubular adenoma of colon   ? ? ?Past Surgical History:  ?Procedure Laterality Date  ? APPENDECTOMY  2001  ? COLONOSCOPY    ? ELBOW SURGERY Right 2016  ? ENDOMETRIAL ABLATION  2003  ? HIP FRACTURE SURGERY Right 2009  ? fell playing with dog  ? labrum repair Right  2011  ? NASAL SINUS SURGERY    ? 1998  ? OOPHORECTOMY Right 2001  ? due to large ovarian cyst  ? ovarian cysts    ? removed x 4 before the oophorectomy  ? POLYPECTOMY    ? THYROIDECTOMY  1997  ? ? ?Social History  ? ?Socioeconomic History  ? Marital status: Married  ?  Spouse name: Not on file  ? Number of children: Not on file  ? Years of education: Not on file  ? Highest education level: Not on file  ?Occupational History  ? Not on file  ?Tobacco Use  ? Smoking status: Former  ?  Packs/day: 0.25  ?  Years: 15.00  ?  Pack years: 3.75  ?  Types: Cigarettes  ?  Quit date: 01/16/1999  ?  Years since quitting: 22.2  ? Smokeless tobacco: Never  ?Vaping Use  ? Vaping Use: Never used  ?Substance and Sexual Activity  ? Alcohol use: Not Currently  ?  Alcohol/week: 0.0 standard drinks  ?  Comment: none in 2 years as of 05/07/18  ? Drug use: No  ? Sexual activity: Yes  ?  Birth control/protection: None  ?Other Topics Concern  ? Not on file  ?Social History Narrative  ?  Married (second marriage) has one son in [place]  ? Worked in Psychologist, educational  ? Working at a [employer] as a Radiation protection practitioner  ? Enjoys exercising, hiking, kayaking.   ? Completed 12th grade  ? 1 dog, 1 cat  ? Was raised by her grandmother.   ? ?Social Determinants of Health  ? ?Financial Resource Strain: Not on file  ?Food Insecurity: Not on file  ?Transportation Needs: Not on file  ?Physical Activity: Not on file  ?Stress: Not on file  ?Social Connections: Not on file  ?Intimate Partner Violence: Not on file  ? ?Family History  ?Problem Relation Age of Onset  ? Hyperlipidemia Father   ? Hypertension Father   ? Heart failure Father 16  ? Diabetes Sister   ? Arthritis Paternal Grandmother   ? Kidney disease Paternal Grandmother   ? Diabetes Paternal Grandmother   ? Arthritis Paternal Grandfather   ? Hepatitis C Son   ? Arthritis Son   ? Drug abuse Son   ? Colon cancer Neg Hx   ? Colon polyps Neg Hx   ? Esophageal cancer Neg Hx   ? Rectal cancer Neg Hx   ?  Stomach cancer Neg Hx   ? Liver disease Neg Hx   ? Inflammatory bowel disease Neg Hx   ? ? ?Current Outpatient Medications  ?Medication Sig Dispense Refill  ? amitriptyline (ELAVIL) 25 MG tablet TAKE 1 TABLET AT BEDTIME 90 tablet 1  ? atorvastatin (LIPITOR) 20 MG tablet Take 1 tablet (20 mg total) by mouth daily. 90 tablet 1  ? empagliflozin (JARDIANCE) 10 MG TABS tablet Take 1 tablet (10 mg total) by mouth daily. 90 tablet 1  ? escitalopram (LEXAPRO) 10 MG tablet Take 1 tablet (10 mg total) by mouth daily. 90 tablet 1  ? glipiZIDE (GLUCOTROL XL) 5 MG 24 hr tablet Take 1 tablet (5 mg total) by mouth daily with breakfast. 90 tablet 0  ? levothyroxine (SYNTHROID) 75 MCG tablet Take 1 tablet (75 mcg total) by mouth daily. 90 tablet 1  ? meloxicam (MOBIC) 7.5 MG tablet Take 1 tablet (7.5 mg total) by mouth daily. 30 tablet 2  ? pioglitazone (ACTOS) 30 MG tablet Take 1 tablet (30 mg total) by mouth daily. 90 tablet 1  ? sitaGLIPtin (JANUVIA) 100 MG tablet Take 1 tablet (100 mg total) by mouth daily. 90 tablet 1  ? ?No current facility-administered medications for this visit.  ? ? ?Allergies  ?Allergen Reactions  ? Tramadol Itching  ? ? ? ?REVIEW OF SYSTEMS:  ? ?'[X]'$  denotes positive finding, '[ ]'$  denotes negative finding ?Cardiac  Comments:  ?Chest pain or chest pressure:    ?Shortness of breath upon exertion:    ?Short of breath when lying flat:    ?Irregular heart rhythm:    ?    ?Vascular    ?Pain in calf, thigh, or hip brought on by ambulation:    ?Pain in feet at night that wakes you up from your sleep:     ?Blood clot in your veins:    ?Leg swelling:     ?    ?Pulmonary    ?Oxygen at home:    ?Productive cough:     ?Wheezing:     ?    ?Neurologic    ?Sudden weakness in arms or legs:     ?Sudden numbness in arms or legs:     ?Sudden onset of difficulty speaking or slurred speech:    ?Temporary loss of vision  in one eye:     ?Problems with dizziness:     ?    ?Gastrointestinal    ?Blood in stool:     ?Vomited blood:      ?    ?Genitourinary    ?Burning when urinating:     ?Blood in urine:    ?    ?Psychiatric    ?Major depression:     ?    ?Hematologic    ?Bleeding problems:    ?Problems with blood clotting too easily:    ?    ?Skin    ?Rashes or ulcers:    ?    ?Constitutional    ?Fever or chills:    ? ? ?PHYSICAL EXAMINATION: ? ?There were no vitals filed for this visit. ? ?General:  WDWN in NAD; vital signs documented above ?Gait: Not observed ?HENT: WNL, normocephalic ?Pulmonary: normal non-labored breathing , without wheezing ?Cardiac: regular HR ?Abdomen: soft, NT, some point tenderness on the left flank over the rib cage.  No mass appreciated ?Skin: without rashes ?Vascular Exam/Pulses: ? Right Left  ?Radial 2+ (normal) 2+ (normal)  ?Ulnar    ?Femoral    ?Popliteal    ?DP 2+ (normal) 2+ (normal)  ?PT    ? ?Extremities: without ischemic changes ?Musculoskeletal: no muscle wasting or atrophy  ?Neurologic: A&O X 3;  No focal weakness or paresthesias are detected ?Psychiatric:  The pt has Normal affect. ? ? ?Non-Invasive Vascular Imaging:   ? ?IMPRESSION: ?1. No acute abnormality in the abdomen/pelvis. No abdominal wall ?hernia or explanation for left upper quadrant pain. No evidence of ?abdominal mass. ?2. Mild colonic diverticulosis without diverticulitis. ?3. 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 ?4. Borderline hepatic steatosis. ? ? ? ?ASSESSMENT/PLAN: Donna Anderson is a 61 y.o. female presenting with a 40m splenic artery aneurysm.  Imaging from 03/10/2021 was reviewed, demonstrating thrombosed 16 mm splenic artery aneurysm.  Fortunately the site has thrombosed, which is the treatment for such lesions.  Lesion should not have any further growth as it is thrombosed.  No other aneurysms appreciated in the abdomen and pelvis. ? ?Donna Anderson can follow-up with my office as needed. ? ? ?JBroadus John MD ?[facility] ?[phone]? ?

## 2021-03-31 ENCOUNTER — Encounter: Payer: Self-pay | Admitting: Vascular Surgery

## 2021-03-31 ENCOUNTER — Other Ambulatory Visit: Payer: Self-pay

## 2021-03-31 ENCOUNTER — Ambulatory Visit: Payer: BC Managed Care – PPO | Admitting: Vascular Surgery

## 2021-03-31 VITALS — BP 111/77 | HR 96 | Temp 98.7°F | Resp 20 | Ht 63.0 in | Wt 139.0 lb

## 2021-03-31 DIAGNOSIS — I728 Aneurysm of other specified arteries: Secondary | ICD-10-CM

## 2021-04-10 ENCOUNTER — Encounter: Payer: Self-pay | Admitting: Family

## 2021-04-10 MED ORDER — MECLIZINE HCL 25 MG PO TABS: 25.0000 mg | ORAL_TABLET | Freq: Three times a day (TID) | ORAL | 0 refills | Status: AC | PRN

## 2021-05-12 DIAGNOSIS — R457 State of emotional shock and stress, unspecified: Secondary | ICD-10-CM | POA: Diagnosis not present

## 2021-05-12 DIAGNOSIS — R0789 Other chest pain: Secondary | ICD-10-CM | POA: Diagnosis not present

## 2021-05-12 DIAGNOSIS — R079 Chest pain, unspecified: Secondary | ICD-10-CM | POA: Diagnosis not present

## 2021-05-12 DIAGNOSIS — M542 Cervicalgia: Secondary | ICD-10-CM | POA: Diagnosis not present

## 2021-05-17 ENCOUNTER — Encounter: Payer: Self-pay | Admitting: Family

## 2021-06-02 ENCOUNTER — Ambulatory Visit: Payer: BC Managed Care – PPO | Admitting: Family

## 2021-06-02 ENCOUNTER — Encounter: Payer: Self-pay | Admitting: Family

## 2021-06-02 ENCOUNTER — Encounter: Payer: Self-pay | Admitting: *Deleted

## 2021-06-02 ENCOUNTER — Telehealth: Payer: Self-pay | Admitting: Family

## 2021-06-02 VITALS — BP 124/87 | HR 90 | Resp 20 | Ht 63.0 in | Wt 138.0 lb

## 2021-06-02 DIAGNOSIS — E119 Type 2 diabetes mellitus without complications: Secondary | ICD-10-CM

## 2021-06-02 DIAGNOSIS — F419 Anxiety disorder, unspecified: Secondary | ICD-10-CM

## 2021-06-02 DIAGNOSIS — E039 Hypothyroidism, unspecified: Secondary | ICD-10-CM

## 2021-06-02 DIAGNOSIS — E785 Hyperlipidemia, unspecified: Secondary | ICD-10-CM

## 2021-06-02 DIAGNOSIS — Z23 Encounter for immunization: Secondary | ICD-10-CM

## 2021-06-02 DIAGNOSIS — R202 Paresthesia of skin: Secondary | ICD-10-CM | POA: Diagnosis not present

## 2021-06-02 DIAGNOSIS — Z79899 Other long term (current) drug therapy: Secondary | ICD-10-CM

## 2021-06-02 LAB — HEMOGLOBIN A1C: Hgb A1c MFr Bld: 8 % — ABNORMAL HIGH (ref 4.6–6.5)

## 2021-06-02 LAB — B12 AND FOLATE PANEL
Folate: 13.3 ng/mL (ref >5.9)
Vitamin B-12: 428 pg/mL (ref 211–911)

## 2021-06-02 LAB — BASIC METABOLIC PANEL
BUN: 9 mg/dL (ref 6–23)
CO2: 27 mEq/L (ref 19–32)
Calcium: 9.2 mg/dL (ref 8.4–10.5)
Chloride: 103 mEq/L (ref 96–112)
Creatinine, Ser: 1 mg/dL (ref 0.40–1.20)
GFR: 61.56 mL/min (ref >60.00)
Glucose, Bld: 153 mg/dL — ABNORMAL HIGH (ref 70–99)
Potassium: 4.7 mEq/L (ref 3.5–5.1)
Sodium: 141 mEq/L (ref 135–145)

## 2021-06-02 LAB — TSH: TSH: 26.66 u[IU]/mL — ABNORMAL HIGH (ref 0.35–5.50)

## 2021-06-02 NOTE — Patient Instructions (Signed)
Please complete lab work prior to leaving.   

## 2021-06-02 NOTE — Telephone Encounter (Signed)
Request sent over.

## 2021-06-02 NOTE — Assessment & Plan Note (Signed)
Clinically stable. Continue jardiance, glucotrol, actos, [place].  Obtain follow up A1C.

## 2021-06-02 NOTE — Progress Notes (Signed)
Subjective:     Patient ID: Donna Anderson, female    DOB: 1960-05-29, 61 y.o.   MRN: [id number]  Chief Complaint  Patient presents with   Follow-up   Anxiety    Anxiety      DM2- on actos, januvia, jardiance and glipizide.   Lab Results  Component Value Date   HGBA1C 7.3 (H) 03/03/2021   HGBA1C 8.0 (H) 11/25/2020   HGBA1C 8.4 (H) 07/22/2020   Lab Results  Component Value Date   MICROALBUR 1.8 04/03/2019   LDLCALC 105 (H) 03/03/2021   CREATININE 0.88 03/03/2021   Lab Results  Component Value Date   TSH 4.08 11/25/2020   Hyperlipidemia-  Lab Results  Component Value Date   CHOL 176 03/03/2021   HDL 51.70 03/03/2021   LDLCALC 105 (H) 03/03/2021   LDLDIRECT 106.0 04/15/2020   TRIG 93.0 03/03/2021   CHOLHDL 3 03/03/2021   Right middle/4th finger and right foot (ball and first few toes)  Health Maintenance Due  Topic Date Due   URINE MICROALBUMIN  04/02/2020   OPHTHALMOLOGY EXAM  04/24/2020    Past Medical History:  Diagnosis Date   Allergy    Anemia    Anxiety    Aortic atherosclerosis ([place])    Arthritis    Depression    Diabetes type 2, controlled ([place]) 06/21/2016   Diverticulosis 2015   CT SCAN    GERD (gastroesophageal reflux disease)    Graves disease 1997   Hearing loss    Hepatic steatosis    History of chicken pox    History of migraine    Hyperlipidemia    Hypertriglyceridemia 06/21/2016   Internal hemorrhoids    Kidney stones    Osteopenia 07/08/2016   Osteoporosis    osteopenia   Tubular adenoma of colon     Past Surgical History:  Procedure Laterality Date   APPENDECTOMY  2001   COLONOSCOPY     ELBOW SURGERY Right 2016   ENDOMETRIAL ABLATION  2003   HIP FRACTURE SURGERY Right 2009   fell playing with dog   labrum repair Right 2011   [place]   OOPHORECTOMY Right 2001   due to large ovarian cyst   ovarian cysts     removed x 4 before the oophorectomy   POLYPECTOMY     THYROIDECTOMY  1997     Family History  Problem Relation Age of Onset   Hyperlipidemia Father    Hypertension Father    Heart failure Father 65   Diabetes Sister    Arthritis Paternal Grandmother    Kidney disease Paternal Grandmother    Diabetes Paternal Grandmother    Arthritis Paternal Grandfather    Hepatitis C Son    Arthritis Son    Drug abuse Son    Colon cancer Neg Hx    Colon polyps Neg Hx    Esophageal cancer Neg Hx    Rectal cancer Neg Hx    Stomach cancer Neg Hx    Liver disease Neg Hx    Inflammatory bowel disease Neg Hx     Social History   Socioeconomic History   Marital status: Married    Spouse name: Not on file   Number of children: Not on file   Years of education: Not on file   Highest education level: Not on file  Occupational History   Not on file  Tobacco Use   Smoking status: Former    Packs/day:  0.25    Years: 15.00    Pack years: 3.75    Types: Cigarettes    Quit date: 01/16/1999    Years since quitting: 22.3   Smokeless tobacco: Never  Vaping Use   Vaping Use: Never used  Substance and Sexual Activity   Alcohol use: Not Currently    Alcohol/week: 0.0 standard drinks    Comment: none in 2 years as of 05/07/18   Drug use: No   Sexual activity: Yes    Birth control/protection: None  Other Topics Concern   Not on file  Social History Narrative   Married (second marriage) has one son in [place]   Worked in Psychologist, 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    Completed 12th grade   1 dog, 1 cat   Was raised by her grandmother.    Social Determinants of Health   Financial Resource Strain: Not on file  Food Insecurity: Not on file  Transportation Needs: Not on file  Physical Activity: Not on file  Stress: Not on file  Social Connections: Not on file  Intimate Partner Violence: Not on file    Outpatient Medications Prior to Visit  Medication Sig Dispense Refill   amitriptyline (ELAVIL) 25 MG  tablet TAKE 1 TABLET AT BEDTIME 90 tablet 1   atorvastatin (LIPITOR) 20 MG tablet Take 1 tablet (20 mg total) by mouth daily. 90 tablet 1   empagliflozin (JARDIANCE) 10 MG TABS tablet Take 1 tablet (10 mg total) by mouth daily. 90 tablet 1   escitalopram (LEXAPRO) 10 MG tablet Take 1 tablet (10 mg total) by mouth daily. 90 tablet 1   glipiZIDE (GLUCOTROL XL) 5 MG 24 hr tablet Take 1 tablet (5 mg total) by mouth daily with breakfast. 90 tablet 0   levothyroxine (SYNTHROID) 75 MCG tablet Take 1 tablet (75 mcg total) by mouth daily. 90 tablet 1   meclizine (ANTIVERT) 25 MG tablet Take 1 tablet (25 mg total) by mouth 3 (three) times daily as needed for dizziness. 30 tablet 0   meloxicam (MOBIC) 7.5 MG tablet Take 1 tablet (7.5 mg total) by mouth daily. 30 tablet 2   pioglitazone (ACTOS) 30 MG tablet Take 1 tablet (30 mg total) by mouth daily. 90 tablet 1   sitaGLIPtin (JANUVIA) 100 MG tablet Take 1 tablet (100 mg total) by mouth daily. 90 tablet 1   No facility-administered medications prior to visit.    Allergies  Allergen Reactions   Tramadol Itching    ROS    See HPI  Objective:    Physical Exam Constitutional:      General: She is not in acute distress.    Appearance: Normal appearance. She is well-developed.  HENT:     Head: Normocephalic and atraumatic.     Right Ear: External ear normal.     Left Ear: External ear normal.  Eyes:     General: No scleral icterus. Neck:     Thyroid: No thyromegaly.  Cardiovascular:     Rate and Rhythm: Normal rate and regular rhythm.     Heart sounds: Normal heart sounds. No murmur heard. Pulmonary:     Effort: Pulmonary effort is normal. No respiratory distress.     Breath sounds: Normal breath sounds. No wheezing.  Musculoskeletal:     Cervical back: Neck supple.  Skin:    General: Skin is warm and dry.  Neurological:     Mental Status: She  is alert and oriented to person, place, and time.  Psychiatric:        Mood and Affect: Mood  normal.        Behavior: Behavior normal.        Thought Content: Thought content normal.        Judgment: Judgment normal.    BP 124/87 (BP Location: Left Arm, Patient Position: Sitting, Cuff Size: Normal)   Pulse 90   Resp 20   Ht '5\' 3"'$  (1.6 m)   Wt 138 lb (62.6 kg)   LMP 01/26/2001   SpO2 98%   BMI 24.45 kg/m  Wt Readings from Last 3 Encounters:  06/02/21 138 lb (62.6 kg)  03/31/21 139 lb (63 kg)  03/03/21 137 lb (62.1 kg)       Assessment & Plan:   Problem List Items Addressed This Visit       Unprioritized   Paresthesia    ? Due to neuropathy.  Declines gabapentin.  Will check b12 and folate levels.        Relevant Orders   B12 and Folate Panel   Hypothyroidism   Relevant Orders   TSH   Hyperlipidemia    Maintained on atorvastatin. Continue same. Obtain follow up lipid panel.        Diabetes mellitus type 2 in nonobese ([facility]) - Primary    Clinically stable. Continue jardiance, glucotrol, actos, [place].  Obtain follow up A1C.        Relevant Orders   Hemoglobin K8M   Basic metabolic panel   Anxiety    Worsened due to situational factors (lost her job).  Continue lexapro '10mg'$ . She wishes to keep lorazepam on hand for prn use.  Also, UDS will be obtained.    Shingrix #2 today.        Other Visit Diagnoses     High risk medication use       Relevant Orders   DRUG MONITORING, PANEL 8 WITH CONFIRMATION, URINE   Need for shingles vaccine       Relevant Orders   Varicella-zoster vaccine IM (Shingrix) (Completed)       I am having Jaskirat K. Hagerman maintain her meloxicam, atorvastatin, escitalopram, levothyroxine, sitaGLIPtin, pioglitazone, empagliflozin, amitriptyline, glipiZIDE, and meclizine.  No orders of the defined types were placed in this encounter.

## 2021-06-02 NOTE — Assessment & Plan Note (Signed)
?   Due to neuropathy.  Declines gabapentin.  Will check b12 and folate levels.

## 2021-06-02 NOTE — Assessment & Plan Note (Signed)
Maintained on atorvastatin. Continue same. Obtain follow up lipid panel.

## 2021-06-02 NOTE — Assessment & Plan Note (Signed)
Worsened due to situational factors (lost her job).  Continue lexapro '10mg'$ . She wishes to keep lorazepam on hand for prn use.  Also, UDS will be obtained.    Shingrix #2 today.

## 2021-06-02 NOTE — Telephone Encounter (Signed)
Please call [facility] In [place] to request copy of last DM eye.

## 2021-06-03 LAB — DRUG MONITORING, PANEL 8 WITH CONFIRMATION, URINE
6 Acetylmorphine: NEGATIVE ng/mL (ref <10)
Alcohol Metabolites: NEGATIVE ng/mL (ref <500)
Amphetamines: NEGATIVE ng/mL (ref <500)
Benzodiazepines: NEGATIVE ng/mL (ref <100)
Buprenorphine, Urine: NEGATIVE ng/mL (ref <5)
Cocaine Metabolite: NEGATIVE ng/mL (ref <150)
Creatinine: 32.7 mg/dL (ref >=20.0)
MDMA: NEGATIVE ng/mL (ref <500)
Marijuana Metabolite: NEGATIVE ng/mL (ref <20)
Opiates: NEGATIVE ng/mL (ref <100)
Oxidant: NEGATIVE ug/mL (ref <200)
Oxycodone: NEGATIVE ng/mL (ref <100)
pH: 6 (ref 4.5–9.0)

## 2021-06-03 LAB — DM TEMPLATE

## 2021-06-06 ENCOUNTER — Other Ambulatory Visit: Payer: Self-pay | Admitting: Family

## 2021-06-06 DIAGNOSIS — E039 Hypothyroidism, unspecified: Secondary | ICD-10-CM

## 2021-06-06 MED ORDER — VITAMIN B-12 1000 MCG PO TABS: 1000.0000 ug | ORAL_TABLET | Freq: Every day | ORAL | Status: DC

## 2021-06-06 NOTE — Telephone Encounter (Signed)
A1C is above goal still at 8.0.  We have maxed out pill options. Would she be willing to do a once a day insulin shot?    B12 is low normal. I would recommend that she add b12 1016mg PO once daily OTC. This could be contributing to the tingling/numbness she reported.   Looks like synthroid needs to be increased (confirm she has been taking synthroid daily).  If so, please increase synthroid from 731m to 10034monce daily. Take in AM on empty stomach.  Wait 30 minutes before eating/taking other medications. Repeat TSH in 6 weeks, dx hypothyroid.

## 2021-06-14 DIAGNOSIS — Z111 Encounter for screening for respiratory tuberculosis: Secondary | ICD-10-CM | POA: Diagnosis not present

## 2021-06-14 NOTE — Telephone Encounter (Signed)
Just checking that you received this message to call patient please.

## 2021-06-15 MED ORDER — LEVOTHYROXINE SODIUM 100 MCG PO TABS: 100.0000 ug | ORAL_TABLET | Freq: Every day | ORAL | 0 refills | Status: DC

## 2021-06-15 NOTE — Telephone Encounter (Signed)
Patient advised of all results, she agrees to start once a day insulin shots. She reported taking the synthroid as prescribed in am 30 min before eating. Rx for 100 mg sent.  She will return 07-28-21 for TSH

## 2021-06-21 ENCOUNTER — Encounter: Payer: Self-pay | Admitting: Family

## 2021-06-21 ENCOUNTER — Other Ambulatory Visit: Payer: Self-pay | Admitting: Family

## 2021-06-21 MED ORDER — FREESTYLE LIBRE 3 SENSOR MISC: 5 refills | Status: DC

## 2021-06-22 MED ORDER — INSULIN PEN NEEDLE 31G X 5 MM MISC: 3 refills | Status: DC

## 2021-06-22 MED ORDER — LANTUS SOLOSTAR 100 UNIT/ML ~~LOC~~ SOPN: 5.0000 [IU] | PEN_INJECTOR | Freq: Every day | SUBCUTANEOUS | 99 refills | Status: DC

## 2021-06-22 NOTE — Addendum Note (Signed)
Addended by: Debbrah Alar on: 06/22/2021 12:25 PM   Modules accepted: Orders

## 2021-06-28 ENCOUNTER — Encounter: Payer: Self-pay | Admitting: Family

## 2021-06-28 MED ORDER — LANTUS SOLOSTAR 100 UNIT/ML ~~LOC~~ SOPN: 8.0000 [IU] | PEN_INJECTOR | Freq: Every day | SUBCUTANEOUS | 99 refills | Status: DC

## 2021-06-29 ENCOUNTER — Other Ambulatory Visit: Payer: Self-pay

## 2021-06-29 MED ORDER — ESCITALOPRAM OXALATE 10 MG PO TABS: 10.0000 mg | ORAL_TABLET | Freq: Every day | ORAL | 1 refills | Status: DC

## 2021-07-02 ENCOUNTER — Encounter: Payer: Self-pay | Admitting: Family

## 2021-07-03 ENCOUNTER — Other Ambulatory Visit: Payer: Self-pay

## 2021-07-03 MED ORDER — SITAGLIPTIN PHOSPHATE 100 MG PO TABS: 100.0000 mg | ORAL_TABLET | Freq: Every day | ORAL | 1 refills | Status: DC

## 2021-07-03 NOTE — Telephone Encounter (Signed)
Rx refill sent to [facility] at patient's request as Januvia 90 day supply

## 2021-07-04 ENCOUNTER — Encounter: Payer: Self-pay | Admitting: Family

## 2021-07-04 MED ORDER — LANTUS SOLOSTAR 100 UNIT/ML ~~LOC~~ SOPN: 10.0000 [IU] | PEN_INJECTOR | Freq: Every day | SUBCUTANEOUS | 99 refills | Status: DC

## 2021-07-17 ENCOUNTER — Other Ambulatory Visit: Payer: Self-pay

## 2021-07-17 MED ORDER — ATORVASTATIN CALCIUM 20 MG PO TABS: 20.0000 mg | ORAL_TABLET | Freq: Every day | ORAL | 1 refills | Status: DC

## 2021-07-28 ENCOUNTER — Other Ambulatory Visit (INDEPENDENT_AMBULATORY_CARE_PROVIDER_SITE_OTHER): Payer: BC Managed Care – PPO

## 2021-07-28 DIAGNOSIS — E119 Type 2 diabetes mellitus without complications: Secondary | ICD-10-CM

## 2021-07-28 DIAGNOSIS — E039 Hypothyroidism, unspecified: Secondary | ICD-10-CM | POA: Diagnosis not present

## 2021-07-28 LAB — TSH: TSH: 1.46 u[IU]/mL (ref 0.35–5.50)

## 2021-07-28 LAB — MICROALBUMIN / CREATININE URINE RATIO
Creatinine,U: 124.6 mg/dL
Microalb Creat Ratio: 2.4 mg/g (ref 0.0–30.0)
Microalb, Ur: 3 mg/dL — ABNORMAL HIGH (ref 0.0–1.9)

## 2021-07-31 ENCOUNTER — Other Ambulatory Visit: Payer: Self-pay | Admitting: Family

## 2021-07-31 DIAGNOSIS — R809 Proteinuria, unspecified: Secondary | ICD-10-CM

## 2021-07-31 NOTE — Telephone Encounter (Signed)
Urine is showing a small amount of protein from her diabetes. I would recommend that she add lisinopril 2.'5mg'$  once daily for kidney protection and repeat bmet in 1 week, dx microalbuminuria. I have pended the rx.

## 2021-08-01 MED ORDER — LISINOPRIL 2.5 MG PO TABS: 2.5000 mg | ORAL_TABLET | Freq: Every day | ORAL | 1 refills | Status: DC

## 2021-08-01 NOTE — Telephone Encounter (Signed)
Patient advised of results, new prescription and scheduled to return in one week for labs.

## 2021-08-09 ENCOUNTER — Other Ambulatory Visit (INDEPENDENT_AMBULATORY_CARE_PROVIDER_SITE_OTHER): Payer: BC Managed Care – PPO

## 2021-08-09 DIAGNOSIS — R809 Proteinuria, unspecified: Secondary | ICD-10-CM | POA: Diagnosis not present

## 2021-08-09 LAB — BASIC METABOLIC PANEL
BUN: 20 mg/dL (ref 6–23)
CO2: 28 mEq/L (ref 19–32)
Calcium: 8.9 mg/dL (ref 8.4–10.5)
Chloride: 102 mEq/L (ref 96–112)
Creatinine, Ser: 0.88 mg/dL (ref 0.40–1.20)
GFR: 71.68 mL/min (ref >60.00)
Glucose, Bld: 203 mg/dL — ABNORMAL HIGH (ref 70–99)
Potassium: 4.6 mEq/L (ref 3.5–5.1)
Sodium: 137 mEq/L (ref 135–145)

## 2021-08-10 ENCOUNTER — Encounter: Payer: Self-pay | Admitting: Family

## 2021-08-25 LAB — HM DIABETES EYE EXAM

## 2021-09-04 ENCOUNTER — Other Ambulatory Visit: Payer: Self-pay | Admitting: Family

## 2021-09-04 DIAGNOSIS — E039 Hypothyroidism, unspecified: Secondary | ICD-10-CM

## 2021-09-10 ENCOUNTER — Other Ambulatory Visit: Payer: Self-pay | Admitting: Family

## 2021-09-15 ENCOUNTER — Telehealth: Payer: Self-pay | Admitting: Family

## 2021-09-15 ENCOUNTER — Encounter: Payer: Self-pay | Admitting: Family

## 2021-09-15 ENCOUNTER — Ambulatory Visit: Payer: BC Managed Care – PPO | Admitting: Family

## 2021-09-15 ENCOUNTER — Other Ambulatory Visit: Payer: Self-pay | Admitting: Family

## 2021-09-15 VITALS — BP 105/67 | HR 71 | Temp 97.9°F | Resp 16 | Wt 142.0 lb

## 2021-09-15 DIAGNOSIS — Z0000 Encounter for general adult medical examination without abnormal findings: Secondary | ICD-10-CM

## 2021-09-15 DIAGNOSIS — E785 Hyperlipidemia, unspecified: Secondary | ICD-10-CM

## 2021-09-15 DIAGNOSIS — E039 Hypothyroidism, unspecified: Secondary | ICD-10-CM | POA: Diagnosis not present

## 2021-09-15 DIAGNOSIS — E119 Type 2 diabetes mellitus without complications: Secondary | ICD-10-CM

## 2021-09-15 DIAGNOSIS — E1165 Type 2 diabetes mellitus with hyperglycemia: Secondary | ICD-10-CM

## 2021-09-15 LAB — BASIC METABOLIC PANEL
BUN: 18 mg/dL (ref 6–23)
CO2: 30 mEq/L (ref 19–32)
Calcium: 9 mg/dL (ref 8.4–10.5)
Chloride: 104 mEq/L (ref 96–112)
Creatinine, Ser: 0.86 mg/dL (ref 0.40–1.20)
GFR: 73.63 mL/min (ref >60.00)
Glucose, Bld: 131 mg/dL — ABNORMAL HIGH (ref 70–99)
Potassium: 5.6 mEq/L — ABNORMAL HIGH (ref 3.5–5.1)
Sodium: 139 mEq/L (ref 135–145)

## 2021-09-15 LAB — LIPID PANEL
Cholesterol: 168 mg/dL (ref 0–200)
HDL: 45.1 mg/dL (ref >39.00)
LDL Cholesterol: 102 mg/dL — ABNORMAL HIGH (ref 0–99)
NonHDL: 123.36
Total CHOL/HDL Ratio: 4
Triglycerides: 105 mg/dL (ref 0.0–149.0)
VLDL: 21 mg/dL (ref 0.0–40.0)

## 2021-09-15 LAB — HEMOGLOBIN A1C: Hgb A1c MFr Bld: 7.4 % — ABNORMAL HIGH (ref 4.6–6.5)

## 2021-09-15 MED ORDER — ESCITALOPRAM OXALATE 10 MG PO TABS: 15.0000 mg | ORAL_TABLET | Freq: Every day | ORAL | 1 refills | Status: DC

## 2021-09-15 MED ORDER — LANTUS SOLOSTAR 100 UNIT/ML ~~LOC~~ SOPN: 13.0000 [IU] | PEN_INJECTOR | Freq: Every day | SUBCUTANEOUS | 99 refills | Status: DC

## 2021-09-15 NOTE — Assessment & Plan Note (Signed)
Tolerating statin, obtain follow-up lipid panel. 

## 2021-09-15 NOTE — Assessment & Plan Note (Signed)
[place] home readings. Check A1C. Continue jardiance lantus, actos, [place].

## 2021-09-15 NOTE — Assessment & Plan Note (Signed)
TSH WNL. Continue current dose of synthroid.

## 2021-09-15 NOTE — Telephone Encounter (Signed)
See mychart.  

## 2021-09-15 NOTE — Progress Notes (Signed)
Subjective:     Patient ID: Donna Anderson, female    DOB: 07/19/1960, 61 y.o.   MRN: [id number]  Chief Complaint  Patient presents with   Diabetes    Here for follow up    HPI Patient is in today for follow up.   DM2- on jardiance lantus, actos, januvia.  Home sugars 117-130.  Denies hypoglycemia.   Lab Results  Component Value Date   HGBA1C 8.0 (H) 06/02/2021   HGBA1C 7.3 (H) 03/03/2021   HGBA1C 8.0 (H) 11/25/2020   Lab Results  Component Value Date   MICROALBUR 3.0 (H) 07/28/2021   LDLCALC 105 (H) 03/03/2021   CREATININE 0.88 08/09/2021   Hypothyroid- continues synthroid.  Lab Results  Component Value Date   TSH 1.46 07/28/2021     Hyperlipidemia- on atorvastatin '10mg'$ .  Lab Results  Component Value Date   CHOL 176 03/03/2021   HDL 51.70 03/03/2021   LDLCALC 105 (H) 03/03/2021   LDLDIRECT 106.0 04/15/2020   TRIG 93.0 03/03/2021   CHOLHDL 3 03/03/2021   Depression- mood only ok. She is not currently working- previous job hurt her hands too much so she had to quit. Currently on Lexapro '10mg'$ .   Taking amitrypline '25mg'$  once daily at bedtime for sleep. Helps some of the time.    Health Maintenance Due  Topic Date Due   PAP SMEAR-Modifier  06/16/2019   MAMMOGRAM  06/18/2021   INFLUENZA VACCINE  08/15/2021    Past Medical History:  Diagnosis Date   Allergy    Anemia    Anxiety    Aortic atherosclerosis ([place])    Arthritis    Depression    Diabetes type 2, controlled ([place]) 06/21/2016   Diverticulosis 2015   CT SCAN    GERD (gastroesophageal reflux disease)    Graves disease 1997   Hearing loss    Hepatic steatosis    History of chicken pox    History of migraine    Hyperlipidemia    Hypertriglyceridemia 06/21/2016   Internal hemorrhoids    Kidney stones    Osteopenia 07/08/2016   Osteoporosis    osteopenia   Tubular adenoma of colon     Past Surgical History:  Procedure Laterality Date   APPENDECTOMY  2001   COLONOSCOPY     ELBOW SURGERY  Right 2016   ENDOMETRIAL ABLATION  2003   HIP FRACTURE SURGERY Right 2009   fell playing with dog   labrum repair Right 2011   [place] Right 2001   due to large ovarian cyst   ovarian cysts     removed x 4 before the oophorectomy   POLYPECTOMY     THYROIDECTOMY  1997    Family History  Problem Relation Age of Onset   Hyperlipidemia Father    Hypertension Father    Heart failure Father 61   Diabetes Sister    Arthritis Paternal Grandmother    Kidney disease Paternal Grandmother    Diabetes Paternal Grandmother    Arthritis Paternal Grandfather    Hepatitis C Son    Arthritis Son    Drug abuse Son    Colon cancer Neg Hx    Colon polyps Neg Hx    Esophageal cancer Neg Hx    Rectal cancer Neg Hx    Stomach cancer Neg Hx    Liver disease Neg Hx    Inflammatory bowel disease Neg Hx     Social History  Socioeconomic History   Marital status: Married    Spouse name: Not on file   Number of children: Not on file   Years of education: Not on file   Highest education level: Not on file  Occupational History   Not on file  Tobacco Use   Smoking status: Former    Packs/day: 0.25    Years: 15.00    Total pack years: 3.75    Types: Cigarettes    Quit date: 01/16/1999    Years since quitting: 22.6   Smokeless tobacco: Never  Vaping Use   Vaping Use: Never used  Substance and Sexual Activity   Alcohol use: Not Currently    Alcohol/week: 0.0 standard drinks of alcohol    Comment: none in 2 years as of 05/07/18   Drug use: No   Sexual activity: Yes    Birth control/protection: None  Other Topics Concern   Not on file  Social History Narrative   Married (second marriage) has one son in [place]   Worked in Psychologist, 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    Completed 12th grade   1 dog, 1 cat   Was raised by her grandmother.    Social Determinants of Health   Financial  Resource Strain: Not on file  Food Insecurity: Not on file  Transportation Needs: Not on file  Physical Activity: Not on file  Stress: Not on file  Social Connections: Not on file  Intimate Partner Violence: Not on file    Outpatient Medications Prior to Visit  Medication Sig Dispense Refill   amitriptyline (ELAVIL) 25 MG tablet TAKE 1 TABLET AT BEDTIME 90 tablet 1   atorvastatin (LIPITOR) 20 MG tablet Take 1 tablet (20 mg total) by mouth daily. 90 tablet 1   empagliflozin (JARDIANCE) 10 MG TABS tablet Take 1 tablet (10 mg total) by mouth daily. 90 tablet 1   insulin glargine (LANTUS SOLOSTAR) 100 UNIT/ML Solostar Pen Inject 10 Units into the skin at bedtime. 9 mL PRN   Insulin Pen Needle 31G X 5 MM MISC Use as directed 100 each 3   levothyroxine (SYNTHROID) 100 MCG tablet Take 1 tablet by mouth once daily 90 tablet 0   lisinopril (ZESTRIL) 2.5 MG tablet Take 1 tablet (2.5 mg total) by mouth daily. 90 tablet 1   meclizine (ANTIVERT) 25 MG tablet Take 1 tablet (25 mg total) by mouth 3 (three) times daily as needed for dizziness. 30 tablet 0   pioglitazone (ACTOS) 30 MG tablet Take 1 tablet (30 mg total) by mouth daily. 90 tablet 1   sitaGLIPtin (JANUVIA) 100 MG tablet Take 1 tablet (100 mg total) by mouth daily. 90 tablet 1   vitamin B-12 (CYANOCOBALAMIN) 1000 MCG tablet Take 1 tablet (1,000 mcg total) by mouth daily. 90 tablet    escitalopram (LEXAPRO) 10 MG tablet Take 1 tablet (10 mg total) by mouth daily. 90 tablet 1   meloxicam (MOBIC) 7.5 MG tablet Take 1 tablet (7.5 mg total) by mouth daily. 30 tablet 2   Continuous Blood Gluc Sensor (FREESTYLE LIBRE 3 SENSOR) MISC Place 1 sensor on the skin every 14 days. Use to check glucose continuously 2 each 5   No facility-administered medications prior to visit.    Allergies  Allergen Reactions   Tramadol Itching    ROS    See HPI Objective:    Physical Exam Constitutional:      General: She is  not in acute distress.     Appearance: Normal appearance. She is well-developed.  HENT:     Head: Normocephalic and atraumatic.     Right Ear: External ear normal.     Left Ear: External ear normal.  Eyes:     General: No scleral icterus. Neck:     Thyroid: No thyromegaly.  Cardiovascular:     Rate and Rhythm: Normal rate and regular rhythm.     Heart sounds: Normal heart sounds. No murmur heard. Pulmonary:     Effort: Pulmonary effort is normal. No respiratory distress.     Breath sounds: Normal breath sounds. No wheezing.  Musculoskeletal:     Cervical back: Neck supple.  Skin:    General: Skin is warm and dry.  Neurological:     Mental Status: She is alert and oriented to person, place, and time.  Psychiatric:        Mood and Affect: Mood normal.        Behavior: Behavior normal.        Thought Content: Thought content normal.        Judgment: Judgment normal.     BP 105/67 (BP Location: Right Arm, Patient Position: Sitting, Cuff Size: Small)   Pulse 71   Temp 97.9 F (36.6 C) (Oral)   Resp 16   Wt 142 lb (64.4 kg)   LMP 01/26/2001   SpO2 100%   BMI 25.15 kg/m  Wt Readings from Last 3 Encounters:  09/15/21 142 lb (64.4 kg)  06/02/21 138 lb (62.6 kg)  03/31/21 139 lb (63 kg)       Assessment & Plan:   Problem List Items Addressed This Visit       Unprioritized   Uncontrolled type 2 diabetes mellitus with hyperglycemia ([place]) - Primary    Fair home readings. Check A1C. Continue jardiance lantus, actos, [place].       Relevant Orders   Basic metabolic panel   Hypothyroidism    TSH WNL. Continue current dose of synthroid.       Hyperlipidemia    Tolerating statin, obtain follow up lipid panel.      Relevant Orders   Lipid panel   Other Visit Diagnoses     Preventative health care       Relevant Orders   MM 3D SCREEN BREAST BILATERAL       I have discontinued Jonie K. Westman's meloxicam and FreeStyle Libre 3 Sensor. I have also changed her escitalopram. Additionally,  I am having her maintain her pioglitazone, empagliflozin, meclizine, cyanocobalamin, Insulin Pen Needle, sitaGLIPtin, Lantus SoloStar, atorvastatin, lisinopril, levothyroxine, and amitriptyline.  Meds ordered this encounter  Medications   escitalopram (LEXAPRO) 10 MG tablet    Sig: Take 1.5 tablets (15 mg total) by mouth daily.    Dispense:  135 tablet    Refill:  1    Order Specific Question:   Supervising Provider    Answer:   Penni Homans A [[id number]]

## 2021-09-15 NOTE — Telephone Encounter (Signed)
Left detailed message on voicemail re: lisinopril and lantus.

## 2021-09-19 ENCOUNTER — Other Ambulatory Visit (INDEPENDENT_AMBULATORY_CARE_PROVIDER_SITE_OTHER): Payer: BC Managed Care – PPO

## 2021-09-19 ENCOUNTER — Ambulatory Visit (HOSPITAL_BASED_OUTPATIENT_CLINIC_OR_DEPARTMENT_OTHER)
Admission: RE | Admit: 2021-09-19 | Discharge: 2021-09-19 | Disposition: A | Discharge status: Home / Self Care | Payer: BC Managed Care – PPO | Source: Ambulatory Visit | Attending: Family | Admitting: Family

## 2021-09-19 ENCOUNTER — Encounter (HOSPITAL_BASED_OUTPATIENT_CLINIC_OR_DEPARTMENT_OTHER): Payer: Self-pay

## 2021-09-19 DIAGNOSIS — E875 Hyperkalemia: Secondary | ICD-10-CM | POA: Diagnosis not present

## 2021-09-19 DIAGNOSIS — Z1231 Encounter for screening mammogram for malignant neoplasm of breast: Secondary | ICD-10-CM | POA: Insufficient documentation

## 2021-09-19 DIAGNOSIS — Z0000 Encounter for general adult medical examination without abnormal findings: Secondary | ICD-10-CM

## 2021-09-19 LAB — BASIC METABOLIC PANEL
BUN: 16 mg/dL (ref 6–23)
CO2: 28 mEq/L (ref 19–32)
Calcium: 8.9 mg/dL (ref 8.4–10.5)
Chloride: 102 mEq/L (ref 96–112)
Creatinine, Ser: 0.83 mg/dL (ref 0.40–1.20)
GFR: 76.83 mL/min (ref >60.00)
Glucose, Bld: 115 mg/dL — ABNORMAL HIGH (ref 70–99)
Potassium: 4.9 mEq/L (ref 3.5–5.1)
Sodium: 140 mEq/L (ref 135–145)

## 2021-09-19 NOTE — Telephone Encounter (Signed)
Patient made appointment for 8:30 am this morning. Future labs needed.

## 2021-10-04 ENCOUNTER — Encounter: Payer: Self-pay | Admitting: Family

## 2021-10-05 NOTE — Telephone Encounter (Signed)
Patient scheduled to come in tomorrow at 2:40

## 2021-10-05 NOTE — Telephone Encounter (Signed)
Please contact pt and let her know that I think she needs an office visit. Can you see if you can get her scheduled for a visit tomorrow?  If I am full you can offer a different provider.

## 2021-10-06 ENCOUNTER — Ambulatory Visit (HOSPITAL_BASED_OUTPATIENT_CLINIC_OR_DEPARTMENT_OTHER)
Admission: RE | Admit: 2021-10-06 | Discharge: 2021-10-06 | Disposition: A | Discharge status: Home / Self Care | Payer: BC Managed Care – PPO | Source: Ambulatory Visit | Attending: Family | Admitting: Family

## 2021-10-06 ENCOUNTER — Ambulatory Visit: Payer: BC Managed Care – PPO | Admitting: Family

## 2021-10-06 VITALS — BP 125/76 | HR 115 | Temp 98.2°F | Resp 16 | Wt 140.0 lb

## 2021-10-06 DIAGNOSIS — R059 Cough, unspecified: Secondary | ICD-10-CM | POA: Diagnosis not present

## 2021-10-06 DIAGNOSIS — R051 Acute cough: Secondary | ICD-10-CM | POA: Diagnosis not present

## 2021-10-06 MED ORDER — BENZONATATE 100 MG PO CAPS: 100.0000 mg | ORAL_CAPSULE | Freq: Three times a day (TID) | ORAL | 0 refills | Status: DC | PRN

## 2021-10-06 MED ORDER — AZITHROMYCIN 250 MG PO TABS: ORAL_TABLET | ORAL | 0 refills | Status: AC

## 2021-10-06 MED ORDER — ALBUTEROL SULFATE HFA 108 (90 BASE) MCG/ACT IN AERS: 2.0000 | INHALATION_SPRAY | Freq: Four times a day (QID) | RESPIRATORY_TRACT | 0 refills | Status: DC | PRN

## 2021-10-06 NOTE — Patient Instructions (Signed)
Please complete chest x ray on the first floor.  

## 2021-10-06 NOTE — Assessment & Plan Note (Signed)
New.  Has been present for 1 week without improvement. Will obtain CXR to rule out PNA.  rx with zpak, prn albuterol and prn tessalon. She is advised to call if symptoms worsen or if not improved in 2-3 days.

## 2021-10-06 NOTE — Progress Notes (Signed)
Subjective:   By signing my name below, I, [name], attest that this documentation has been prepared under the direction and in the presence of [name], NP 10/06/2021   Patient ID: Donna Anderson, female    DOB: 23-Nov-1960, 61 y.o.   MRN: [id number]  Chief Complaint  Patient presents with   Cough    Complains of persistent cough   Nasal Congestion    Complains of nasal and chest congestion    HPI Patient is in today for an office visit  Congestion and Cough: She complains of congestion and a cough that appeared on 09/28/2021. She also experiences shortness of breath when walking for an extended period of time. Symptoms begun with a sore throat and headache but as of today's visit, symptoms are improving. Her husband is also experiencing similar congestion and cough symptoms. She took two Covid tests and results were negative. She regularly drinks water. Sitting up in bed helps alleviate some of her symptoms but her coughing throughout the night is persistent.   Health Maintenance Due  Topic Date Due   PAP SMEAR-Modifier  06/16/2019   INFLUENZA VACCINE  08/15/2021    Past Medical History:  Diagnosis Date   Allergy    Anemia    Anxiety    Aortic atherosclerosis ([place])    Arthritis    Depression    Diabetes type 2, controlled ([place]) 06/21/2016   Diverticulosis 2015   CT SCAN    GERD (gastroesophageal reflux disease)    Graves disease 1997   Hearing loss    Hepatic steatosis    History of chicken pox    History of migraine    Hyperlipidemia    Hypertriglyceridemia 06/21/2016   Internal hemorrhoids    Kidney stones    Osteopenia 07/08/2016   Osteoporosis    osteopenia   Tubular adenoma of colon     Past Surgical History:  Procedure Laterality Date   APPENDECTOMY  2001   COLONOSCOPY     ELBOW SURGERY Right 2016   ENDOMETRIAL ABLATION  2003   HIP FRACTURE SURGERY Right 2009   fell playing with dog   labrum repair Right 2011   [place]   OOPHORECTOMY Right 2001   due to large ovarian cyst   ovarian cysts     removed x 4 before the oophorectomy   POLYPECTOMY     THYROIDECTOMY  1997    Family History  Problem Relation Age of Onset   Hyperlipidemia Father    Hypertension Father    Heart failure Father 73   Diabetes Sister    Arthritis Paternal Grandmother    Kidney disease Paternal Grandmother    Diabetes Paternal Grandmother    Arthritis Paternal Grandfather    Hepatitis C Son    Arthritis Son    Drug abuse Son    Colon cancer Neg Hx    Colon polyps Neg Hx    Esophageal cancer Neg Hx    Rectal cancer Neg Hx    Stomach cancer Neg Hx    Liver disease Neg Hx    Inflammatory bowel disease Neg Hx     Social History   Socioeconomic History   Marital status: Married    Spouse name: Not on file   Number of children: Not on file   Years of education: Not on file   Highest education level: Not on file  Occupational History   Not on file  Tobacco Use   Smoking status: Former    Packs/day: 0.25    Years: 15.00    Total pack years: 3.75    Types: Cigarettes    Quit date: 01/16/1999    Years since quitting: 22.7   Smokeless tobacco: Never  Vaping Use   Vaping Use: Never used  Substance and Sexual Activity   Alcohol use: Not Currently    Alcohol/week: 0.0 standard drinks of alcohol    Comment: none in 2 years as of 05/07/18   Drug use: No   Sexual activity: Yes    Birth control/protection: None  Other Topics Concern   Not on file  Social History Narrative   Married (second marriage) has one son in [place]   Worked in Psychologist, 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    Completed 12th grade   1 dog, 1 cat   Was raised by her grandmother.    Social Determinants of Health   Financial Resource Strain: Not on file  Food Insecurity: Not on file  Transportation Needs: Not on file  Physical Activity: Not on file  Stress: Not on file   Social Connections: Not on file  Intimate Partner Violence: Not on file    Outpatient Medications Prior to Visit  Medication Sig Dispense Refill   amitriptyline (ELAVIL) 25 MG tablet TAKE 1 TABLET AT BEDTIME 90 tablet 1   atorvastatin (LIPITOR) 20 MG tablet Take 1 tablet (20 mg total) by mouth daily. 90 tablet 1   empagliflozin (JARDIANCE) 10 MG TABS tablet Take 1 tablet (10 mg total) by mouth daily. 90 tablet 1   escitalopram (LEXAPRO) 10 MG tablet Take 1.5 tablets (15 mg total) by mouth daily. 135 tablet 1   insulin glargine (LANTUS SOLOSTAR) 100 UNIT/ML Solostar Pen Inject 13 Units into the skin at bedtime. 9 mL PRN   Insulin Pen Needle 31G X 5 MM MISC Use as directed 100 each 3   levothyroxine (SYNTHROID) 100 MCG tablet Take 1 tablet by mouth once daily 90 tablet 0   meclizine (ANTIVERT) 25 MG tablet Take 1 tablet (25 mg total) by mouth 3 (three) times daily as needed for dizziness. 30 tablet 0   pioglitazone (ACTOS) 30 MG tablet Take 1 tablet (30 mg total) by mouth daily. 90 tablet 1   sitaGLIPtin (JANUVIA) 100 MG tablet Take 1 tablet (100 mg total) by mouth daily. 90 tablet 1   vitamin B-12 (CYANOCOBALAMIN) 1000 MCG tablet Take 1 tablet (1,000 mcg total) by mouth daily. 90 tablet    No facility-administered medications prior to visit.    Allergies  Allergen Reactions   Tramadol Itching    Review of Systems  HENT:  Positive for congestion.   Respiratory:  Positive for cough.        Objective:    Physical Exam Constitutional:      General: She is not in acute distress.    Appearance: Normal appearance. She is not ill-appearing.  HENT:     Head: Normocephalic and atraumatic.     Comments: Voice hoarseness noted    Right Ear: Tympanic membrane, ear canal and external ear normal.     Left Ear: Tympanic membrane, ear canal and external ear normal.  Eyes:     Extraocular Movements: Extraocular movements intact.     Pupils: Pupils are equal, round, and reactive to light.   Neck:     Thyroid: No thyromegaly.  Cardiovascular:  Rate and Rhythm: Normal rate and regular rhythm.     Heart sounds: Normal heart sounds. No murmur heard.    No gallop.  Pulmonary:     Effort: Pulmonary effort is normal. No respiratory distress.     Breath sounds: Rhonchi (Expiratory, Left) present. No wheezing or rales.  Lymphadenopathy:     Cervical: No cervical adenopathy.  Skin:    General: Skin is warm and dry.  Neurological:     Mental Status: She is alert and oriented to person, place, and time.  Psychiatric:        Mood and Affect: Mood normal.        Behavior: Behavior normal.        Judgment: Judgment normal.     BP 125/76 (BP Location: Right Arm, Patient Position: Sitting, Cuff Size: Small)   Pulse (!) 115   Temp 98.2 F (36.8 C) (Oral)   Resp 16   Wt 140 lb (63.5 kg)   LMP 01/26/2001   SpO2 98%   BMI 24.80 kg/m  Wt Readings from Last 3 Encounters:  10/06/21 140 lb (63.5 kg)  09/15/21 142 lb (64.4 kg)  06/02/21 138 lb (62.6 kg)       Assessment & Plan:   Problem List Items Addressed This Visit       Unprioritized   Acute cough - Primary    New.  Has been present for 1 week without improvement. Will obtain CXR to rule out PNA.  rx with zpak, prn albuterol and prn tessalon. She is advised to call if symptoms worsen or if not improved in 2-3 days.       Relevant Orders   DG Chest 2 View   Meds ordered this encounter  Medications   azithromycin (ZITHROMAX) 250 MG tablet    Sig: Take 2 tablets on day 1, then 1 tablet daily on days 2 through 5    Dispense:  6 tablet    Refill:  0    Order Specific Question:   Supervising Provider    Answer:   Penni Homans A [[id number]]   albuterol (VENTOLIN HFA) 108 (90 Base) MCG/ACT inhaler    Sig: Inhale 2 puffs into the lungs every 6 (six) hours as needed for wheezing or shortness of breath.    Dispense:  8 g    Refill:  0    Order Specific Question:   Supervising Provider    Answer:   Penni Homans A  [[id number]]   benzonatate (TESSALON) 100 MG capsule    Sig: Take 1 capsule (100 mg total) by mouth 3 (three) times daily as needed.    Dispense:  20 capsule    Refill:  0    Order Specific Question:   Supervising Provider    Answer:   Penni Homans A [[id number]]    I, Nance Pear, NP, personally preformed the services described in this documentation.  All medical record entries made by the scribe were at my direction and in my presence.  I have reviewed the chart and discharge instructions (if applicable) and agree that the record reflects my personal performance and is accurate and complete. 10/06/2021   I,Amber Collins,acting as a scribe for Nance Pear, NP.,have documented all relevant documentation on the behalf of Nance Pear, NP,as directed by  Nance Pear, NP while in the presence of Nance Pear, NP.    Nance Pear, NP

## 2021-11-08 ENCOUNTER — Encounter: Payer: Self-pay | Admitting: Internal Medicine

## 2021-11-16 ENCOUNTER — Encounter: Payer: Self-pay | Admitting: Internal Medicine

## 2021-12-01 ENCOUNTER — Other Ambulatory Visit: Payer: Self-pay | Admitting: Family

## 2021-12-01 DIAGNOSIS — E039 Hypothyroidism, unspecified: Secondary | ICD-10-CM

## 2021-12-20 ENCOUNTER — Other Ambulatory Visit: Payer: Self-pay | Admitting: Family

## 2021-12-24 ENCOUNTER — Other Ambulatory Visit: Payer: Self-pay | Admitting: Family

## 2022-01-03 ENCOUNTER — Ambulatory Visit (AMBULATORY_SURGERY_CENTER): Payer: BC Managed Care – PPO

## 2022-01-03 VITALS — Ht 63.0 in | Wt 141.0 lb

## 2022-01-03 DIAGNOSIS — Z86010 Personal history of colonic polyps: Secondary | ICD-10-CM

## 2022-01-03 MED ORDER — NA SULFATE-K SULFATE-MG SULF 17.5-3.13-1.6 GM/177ML PO SOLN: 1.0000 | Freq: Once | ORAL | 0 refills | Status: AC

## 2022-01-03 NOTE — Progress Notes (Signed)
No egg or soy allergy known to patient  No issues known to pt with past sedation with any surgeries or procedures Patient denies ever being told they had issues or difficulty with intubation  No FH of Malignant Hyperthermia Pt is not on diet pills Pt is not on  home 02  Pt is not on blood thinners  Pt denies issues with constipation  No A fib or A flutter Have any cardiac testing pending--no Pt instructed to use Single[employer].com or GoodRx for a price reduction on prep  .reddf

## 2022-01-18 ENCOUNTER — Telehealth: Payer: Self-pay | Admitting: Internal Medicine

## 2022-01-18 DIAGNOSIS — Z86010 Personal history of colonic polyps: Secondary | ICD-10-CM

## 2022-01-18 NOTE — Telephone Encounter (Signed)
Called patient, she is very congested, sneezing and runny nose. She has not has a fever but wanted to know if she should still come in tomorrow for her colonoscopy.

## 2022-01-18 NOTE — Telephone Encounter (Signed)
Patient called, stated she woke up yesterday morning with a runny and stuffed nose. Is unsure if she can proceed with her procedure scheduled for tomorrow morning.

## 2022-01-18 NOTE — Telephone Encounter (Signed)
Would resch.  No charge [employer]

## 2022-01-19 ENCOUNTER — Encounter: Payer: BC Managed Care – PPO | Admitting: Internal Medicine

## 2022-02-12 ENCOUNTER — Encounter: Payer: Self-pay | Admitting: Family

## 2022-02-12 MED ORDER — FLUCONAZOLE 150 MG PO TABS: ORAL_TABLET | ORAL | 0 refills | Status: DC

## 2022-02-13 ENCOUNTER — Other Ambulatory Visit: Payer: Self-pay | Admitting: Family

## 2022-02-14 ENCOUNTER — Encounter: Payer: Self-pay | Admitting: Family

## 2022-02-14 ENCOUNTER — Other Ambulatory Visit: Payer: Self-pay

## 2022-02-14 ENCOUNTER — Other Ambulatory Visit (HOSPITAL_COMMUNITY): Payer: Self-pay

## 2022-02-14 MED ORDER — ATORVASTATIN CALCIUM 20 MG PO TABS: 20.0000 mg | ORAL_TABLET | Freq: Every day | ORAL | 0 refills | Status: DC

## 2022-02-14 MED ORDER — ATORVASTATIN CALCIUM 20 MG PO TABS
20.0000 mg | ORAL_TABLET | Freq: Every day | ORAL | 0 refills | Status: DC
  Filled 2022-02-14: qty 90, 90d supply, fill #0

## 2022-02-20 ENCOUNTER — Encounter: Payer: Self-pay | Admitting: Internal Medicine

## 2022-02-26 ENCOUNTER — Encounter: Payer: Self-pay | Admitting: Internal Medicine

## 2022-03-05 ENCOUNTER — Encounter: Payer: Self-pay | Admitting: Internal Medicine

## 2022-03-05 ENCOUNTER — Ambulatory Visit (AMBULATORY_SURGERY_CENTER): Payer: BC Managed Care – PPO | Admitting: Internal Medicine

## 2022-03-05 VITALS — BP 115/73 | HR 78 | Temp 97.8°F | Resp 14 | Ht 63.0 in | Wt 141.0 lb

## 2022-03-05 DIAGNOSIS — Z1211 Encounter for screening for malignant neoplasm of colon: Secondary | ICD-10-CM | POA: Diagnosis not present

## 2022-03-05 DIAGNOSIS — D122 Benign neoplasm of ascending colon: Secondary | ICD-10-CM | POA: Diagnosis not present

## 2022-03-05 DIAGNOSIS — Z09 Encounter for follow-up examination after completed treatment for conditions other than malignant neoplasm: Secondary | ICD-10-CM | POA: Diagnosis not present

## 2022-03-05 DIAGNOSIS — Z86010 Personal history of colonic polyps: Secondary | ICD-10-CM | POA: Diagnosis not present

## 2022-03-05 DIAGNOSIS — D123 Benign neoplasm of transverse colon: Secondary | ICD-10-CM | POA: Diagnosis not present

## 2022-03-05 MED ORDER — SODIUM CHLORIDE 0.9 % IV SOLN: 500.0000 mL | Freq: Once | INTRAVENOUS | Status: DC

## 2022-03-05 NOTE — Progress Notes (Signed)
Pt's states no medical or surgical changes since previsit or office visit. 

## 2022-03-05 NOTE — Patient Instructions (Signed)
Thank you for coming to see [place] today! Resume your diet and regular medications/supplements today. Return to regular daily activities tomorrow. Will recommend next colonoscopy once polyp biopsy results are final.     YOU HAD AN ENDOSCOPIC PROCEDURE TODAY AT [facility]:   Refer to the procedure report that was given to you for any specific questions about what was found during the examination.  If the procedure report does not answer your questions, please call your gastroenterologist to clarify.  If you requested that your care partner not be given the details of your procedure findings, then the procedure report has been included in a sealed envelope for you to review at your convenience later.  YOU SHOULD EXPECT: Some feelings of bloating in the abdomen. Passage of more gas than usual.  Walking can help get rid of the air that was put into your GI tract during the procedure and reduce the bloating. If you had a lower endoscopy (such as a colonoscopy or flexible sigmoidoscopy) you may notice spotting of blood in your stool or on the toilet paper. If you underwent a bowel prep for your procedure, you may not have a normal bowel movement for a few days.  Please Note:  You might notice some irritation and congestion in your nose or some drainage.  This is from the oxygen used during your procedure.  There is no need for concern and it should clear up in a day or so.  SYMPTOMS TO REPORT IMMEDIATELY:  Following lower endoscopy (colonoscopy or flexible sigmoidoscopy):  Excessive amounts of blood in the stool  Significant tenderness or worsening of abdominal pains  Swelling of the abdomen that is new, acute  Fever of 100F or higher   For urgent or emergent issues, a gastroenterologist can be reached at any hour by calling [phone]. Do not use MyChart messaging for urgent concerns.    DIET:  We do recommend a small meal at first, but then you may proceed to your regular  diet.  Drink plenty of fluids but you should avoid alcoholic beverages for 24 hours.  ACTIVITY:  You should plan to take it easy for the rest of today and you should NOT DRIVE or use heavy machinery until tomorrow (because of the sedation medicines used during the test).    FOLLOW UP: Our staff will call the number listed on your records the next business day following your procedure.  We will call around 7:15- 8:00 am to check on you and address any questions or concerns that you may have regarding the information given to you following your procedure. If we do not reach you, we will leave a message.     If any biopsies were taken you will be contacted by phone or by letter within the next 1-3 weeks.  Please call us at [phone] if you have not heard about the biopsies in 3 weeks.    SIGNATURES/CONFIDENTIALITY: You and/or your care partner have signed paperwork which will be entered into your electronic medical record.  These signatures attest to the fact that that the information above on your After Visit Summary has been reviewed and is understood.  Full responsibility of the confidentiality of this discharge information lies with you and/or your care-partner.

## 2022-03-05 NOTE — Progress Notes (Signed)
Called to room to assist during endoscopic procedure.  Patient ID and intended procedure confirmed with present staff. Received instructions for my participation in the procedure from the performing physician.  

## 2022-03-05 NOTE — Progress Notes (Signed)
Report to pacu rn. Vss. Care resumed by rn. 

## 2022-03-05 NOTE — Op Note (Signed)
[place] Patient Name: Donna Anderson Procedure Date: 03/05/2022 8:49 AM MRN: [id number] Endoscopist: Jerene Bears , MD, [id number] Age: 62 Referring MD:  Date of Birth: [birth year] Gender: Female Account #: [id number] Procedure:                Colonoscopy Indications:              High risk colon cancer surveillance: Personal                            history of non-advanced adenomas, Last colonoscopy:                            October 2018 Medicines:                Monitored Anesthesia Care Procedure:                Pre-Anesthesia Assessment:                           - Prior to the procedure, a History and Physical                            was performed, and patient medications and                            allergies were reviewed. The patient's tolerance of                            previous anesthesia was also reviewed. The risks                            and benefits of the procedure and the sedation                            options and risks were discussed with the patient.                            All questions were answered, and informed consent                            was obtained. Prior Anticoagulants: The patient has                            taken no anticoagulant or antiplatelet agents. ASA                            Grade Assessment: III - A patient with severe                            systemic disease. After reviewing the risks and                            benefits, the patient was deemed in satisfactory  condition to undergo the procedure.                           After obtaining informed consent, the colonoscope                            was passed under direct vision. Throughout the                            procedure, the patient's blood pressure, pulse, and                            oxygen saturations were monitored continuously. The                            Olympus PCF-H190DL [id number]) Colonoscope was                             introduced through the anus and advanced to the                            cecum, identified by appendiceal orifice and                            ileocecal valve. The colonoscopy was performed                            without difficulty. The patient tolerated the                            procedure well. The quality of the bowel                            preparation was good. The ileocecal valve,                            appendiceal orifice, and rectum were photographed. Scope In: 8:52:21 AM Scope Out: 9:08:53 AM Scope Withdrawal Time: 0 hours 12 minutes 25 seconds  Total Procedure Duration: 0 hours 16 minutes 32 seconds  Findings:                 The digital rectal exam was normal.                           A 5 mm polyp was found in the ascending colon. The                            polyp was sessile. The polyp was removed with a                            cold snare. Resection and retrieval were complete.                           A 6 mm polyp was found in the transverse colon. The  polyp was sessile. The polyp was removed with a                            cold snare. Resection and retrieval were complete.                           Multiple medium-mouthed and small-mouthed                            diverticula were found in the sigmoid colon and                            descending colon.                           Internal hemorrhoids were found during                            retroflexion. The hemorrhoids were small. Complications:            No immediate complications. Estimated Blood Loss:     Estimated blood loss: none. Impression:               - One 5 mm polyp in the ascending colon, removed                            with a cold snare. Resected and retrieved.                           - One 6 mm polyp in the transverse colon, removed                            with a cold snare. Resected and retrieved.                            - Moderate diverticulosis in the sigmoid colon and                            in the descending colon.                           - Internal hemorrhoids. Recommendation:           - Patient has a contact number available for                            emergencies. The signs and symptoms of potential                            delayed complications were discussed with the                            patient. Return to normal activities tomorrow.                            Written discharge instructions were  provided to the                            patient.                           - Resume previous diet.                           - Continue present medications.                           - Await pathology results.                           - Repeat colonoscopy is recommended for                            surveillance. The colonoscopy date will be                            determined after pathology results from today's                            exam become available for review. Jerene Bears, MD 03/05/2022 9:13:16 AM This report has been signed electronically.

## 2022-03-05 NOTE — Progress Notes (Signed)
GASTROENTEROLOGY PROCEDURE H&P NOTE   Primary Care Physician: Debbrah Alar, NP    Reason for Procedure:  History of colon polyps  Plan:    Surveillance colonoscopy  Patient is appropriate for endoscopic procedure(s) in the ambulatory ([facility]) setting.  The nature of the procedure, as well as the risks, benefits, and alternatives were carefully and thoroughly reviewed with the patient. Ample time for discussion and questions allowed. The patient understood, was satisfied, and agreed to proceed.     HPI: Donna Anderson is a 62 y.o. female who presents for surveillance colonoscopy.  Medical history as below.  Tolerated the prep.  No recent chest pain or shortness of breath.  No abdominal pain today.  Past Medical History:  Diagnosis Date   Allergy    Anemia    Anxiety    Aortic atherosclerosis ([facility])    Arthritis    Depression    Diabetes type 2, controlled ([facility]) 06/21/2016   Diverticulosis 2015   CT SCAN    GERD (gastroesophageal reflux disease)    Graves disease 1997   Hearing loss    Hepatic steatosis    History of chicken pox    History of migraine    Hyperlipidemia    Hypertriglyceridemia 06/21/2016   Internal hemorrhoids    Kidney stones    Osteopenia 07/08/2016   Osteoporosis    osteopenia   Tubular adenoma of colon     Past Surgical History:  Procedure Laterality Date   APPENDECTOMY  2001   COLONOSCOPY     ELBOW SURGERY Right 2016   ENDOMETRIAL ABLATION  2003   HIP FRACTURE SURGERY Right 2009   fell playing with dog   labrum repair Right 2011   [place] Right 2001   due to large ovarian cyst   ovarian cysts     removed x 4 before the oophorectomy   POLYPECTOMY     THYROIDECTOMY  1997    Prior to Admission medications   Medication Sig Start Date End Date Taking? Authorizing Provider  amitriptyline (ELAVIL) 25 MG tablet TAKE 1 TABLET AT BEDTIME 09/10/21  Yes Debbrah Alar, NP  atorvastatin (LIPITOR) 20  MG tablet Take 1 tablet (20 mg total) by mouth daily. 02/14/22  Yes Debbrah Alar, NP  empagliflozin (JARDIANCE) 10 MG TABS tablet Take 1 tablet (10 mg total) by mouth daily. 12/01/20  Yes Debbrah Alar, NP  escitalopram (LEXAPRO) 10 MG tablet Take 1.5 tablets (15 mg total) by mouth daily. 09/15/21  Yes Debbrah Alar, NP  insulin glargine (LANTUS SOLOSTAR) 100 UNIT/ML Solostar Pen Inject 13 Units into the skin at bedtime. 09/15/21  Yes Debbrah Alar, NP  JANUVIA 100 MG tablet Take 1 tablet by mouth once daily 12/24/21  Yes Debbrah Alar, NP  levothyroxine (SYNTHROID) 100 MCG tablet Take 1 tablet by mouth once daily 12/01/21  Yes Debbrah Alar, NP  pioglitazone (ACTOS) 30 MG tablet Take 1 tablet (30 mg total) by mouth daily. 12/01/20  Yes Debbrah Alar, NP  meclizine (ANTIVERT) 25 MG tablet Take 1 tablet (25 mg total) by mouth 3 (three) times daily as needed for dizziness. 04/10/21   Debbrah Alar, NP    Current Outpatient Medications  Medication Sig Dispense Refill   amitriptyline (ELAVIL) 25 MG tablet TAKE 1 TABLET AT BEDTIME 90 tablet 1   atorvastatin (LIPITOR) 20 MG tablet Take 1 tablet (20 mg total) by mouth daily. 90 tablet 0   empagliflozin (JARDIANCE) 10 MG  TABS tablet Take 1 tablet (10 mg total) by mouth daily. 90 tablet 1   escitalopram (LEXAPRO) 10 MG tablet Take 1.5 tablets (15 mg total) by mouth daily. 135 tablet 1   insulin glargine (LANTUS SOLOSTAR) 100 UNIT/ML Solostar Pen Inject 13 Units into the skin at bedtime. 9 mL PRN   JANUVIA 100 MG tablet Take 1 tablet by mouth once daily 90 tablet 0   levothyroxine (SYNTHROID) 100 MCG tablet Take 1 tablet by mouth once daily 90 tablet 0   pioglitazone (ACTOS) 30 MG tablet Take 1 tablet (30 mg total) by mouth daily. 90 tablet 1   meclizine (ANTIVERT) 25 MG tablet Take 1 tablet (25 mg total) by mouth 3 (three) times daily as needed for dizziness. 30 tablet 0   Current Facility-Administered  Medications  Medication Dose Route Frequency Provider Last Rate Last Admin   0.9 %  sodium chloride infusion  500 mL Intravenous Once Ryann Pauli, Lajuan Lines, MD        Allergies as of 03/05/2022 - Review Complete 03/05/2022  Allergen Reaction Noted   Tramadol Itching 10/22/2014    Family History  Problem Relation Age of Onset   Hyperlipidemia Father    Hypertension Father    Heart failure Father 67   Diabetes Sister    Arthritis Paternal Grandmother    Kidney disease Paternal Grandmother    Diabetes Paternal Grandmother    Arthritis Paternal Grandfather    Hepatitis C Son    Arthritis Son    Drug abuse Son    Colon cancer Neg Hx    Colon polyps Neg Hx    Esophageal cancer Neg Hx    Rectal cancer Neg Hx    Stomach cancer Neg Hx    Liver disease Neg Hx    Inflammatory bowel disease Neg Hx     Social History   Socioeconomic History   Marital status: Married    Spouse name: Not on file   Number of children: Not on file   Years of education: Not on file   Highest education level: Not on file  Occupational History   Not on file  Tobacco Use   Smoking status: Former    Packs/day: 0.25    Years: 15.00    Total pack years: 3.75    Types: Cigarettes    Quit date: 01/16/1999    Years since quitting: 23.1   Smokeless tobacco: Never  Vaping Use   Vaping Use: Never used  Substance and Sexual Activity   Alcohol use: Not Currently    Alcohol/week: 0.0 standard drinks of alcohol    Comment: none in 2 years as of 05/07/18   Drug use: No   Sexual activity: Yes    Birth control/protection: None  Other Topics Concern   Not on file  Social History Narrative   Married (second marriage) has one son in [place]   Worked in Psychologist, 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    Completed 12th grade   1 dog, 1 cat   Was raised by her grandmother.    Social Determinants of Health   Financial Resource Strain: Not on file  Food  Insecurity: Not on file  Transportation Needs: Not on file  Physical Activity: Not on file  Stress: Not on file  Social Connections: Not on file  Intimate Partner Violence: Not on file    Physical Exam: Vital signs in last 24 hours: @BP$  127/81  Pulse 88   Temp 97.8 F (36.6 C)   Resp (!) 0   Ht 5' 3"$  (1.6 m)   Wt 141 lb (64 kg)   LMP 01/26/2001   SpO2 99%   BMI 24.98 kg/m  GEN: NAD EYE: Sclerae anicteric ENT: MMM CV: Non-tachycardic Pulm: CTA b/l GI: Soft, NT/ND NEURO:  Alert & Oriented x 3   Zenovia Jarred, MD [facility]  03/05/2022 8:48 AM

## 2022-03-06 ENCOUNTER — Telehealth: Payer: Self-pay | Admitting: *Deleted

## 2022-03-06 NOTE — Telephone Encounter (Signed)
  Follow up Call-     03/05/2022    8:31 AM  Call back number  Post procedure Call Back phone  # [phone]  Permission to leave phone message Yes     Patient questions:  Do you have a fever, pain , or abdominal swelling? No. Pain Score  0 *  Have you tolerated food without any problems? Yes.    Have you been able to return to your normal activities? Yes.    Do you have any questions about your discharge instructions: Diet   No. Medications  No. Follow up visit  No.  Do you have questions or concerns about your Care? No.  Actions: * If pain score is 4 or above: No action needed, pain <4.

## 2022-03-07 ENCOUNTER — Other Ambulatory Visit: Payer: Self-pay | Admitting: Family

## 2022-03-07 DIAGNOSIS — E039 Hypothyroidism, unspecified: Secondary | ICD-10-CM

## 2022-03-08 ENCOUNTER — Encounter: Payer: Self-pay | Admitting: Internal Medicine

## 2022-03-26 ENCOUNTER — Encounter: Payer: Self-pay | Admitting: Family

## 2022-03-26 ENCOUNTER — Ambulatory Visit: Payer: BC Managed Care – PPO | Admitting: Family

## 2022-03-26 VITALS — BP 124/78 | HR 89 | Temp 97.6°F | Resp 16 | Wt 140.0 lb

## 2022-03-26 DIAGNOSIS — H6121 Impacted cerumen, right ear: Secondary | ICD-10-CM | POA: Diagnosis not present

## 2022-03-26 NOTE — Assessment & Plan Note (Addendum)
Ceruminosis is noted.  After verbal consent was obtained wax was carefully removed by CMA using syringing. Instructions for home care to prevent wax buildup are given. Discussed she could try adding some claritin once daily to see if it helps with her pressure in her right ear.

## 2022-03-26 NOTE — Progress Notes (Signed)
Subjective:     Patient ID: Donna Anderson, female    DOB: 1960-10-30, 62 y.o.   MRN: [id number]  Chief Complaint  Patient presents with   Ear Fullness    Complains of waking up with right ear fullness and some pain    HPI Patient is in today for right ear fullness. Had vertigo on Friday, Saturday and 'Sunday. Woke up today with mild fullness, mild pain right ear and notes difficulty hearing out of this ear. Denies associated nasal congestion.   Health Maintenance Due  Topic Date Due   PAP SMEAR-Modifier  06/16/2019   INFLUENZA VACCINE  08/15/2021   FOOT EXAM  11/25/2021   HEMOGLOBIN A1C  03/16/2022    Past Medical History:  Diagnosis Date   Allergy    Anemia    Anxiety    Aortic atherosclerosis ([facility])    Arthritis    Depression    Diabetes type 2, controlled ([facility]) 06/21/2016   Diverticulosis 2015   CT SCAN    GERD (gastroesophageal reflux disease)    Graves disease 1997   Hearing loss    Hepatic steatosis    History of chicken pox    History of migraine    Hyperlipidemia    Hypertriglyceridemia 06/21/2016   Internal hemorrhoids    Kidney stones    Osteopenia 07/08/2016   Osteoporosis    osteopenia   Tubular adenoma of colon     Past Surgical History:  Procedure Laterality Date   APPENDECTOMY  2001   COLONOSCOPY     ELBOW SURGERY Right 2016   ENDOMETRIAL ABLATION  2003   HIP FRACTURE SURGERY Right 2009   fell playing with dog   labrum repair Right 2011   NASAL SINUS SURGERY     19'$ 15   OOPHORECTOMY Right 2001   due to large ovarian cyst   ovarian cysts     removed x 4 before the oophorectomy   POLYPECTOMY     THYROIDECTOMY  1997    Family History  Problem Relation Age of Onset   Hyperlipidemia Father    Hypertension Father    Heart failure Father 81   Diabetes Sister    Arthritis Paternal Grandmother    Kidney disease Paternal Grandmother    Diabetes Paternal Grandmother    Arthritis Paternal Grandfather    Hepatitis C Son    Arthritis Son     Drug abuse Son    Colon cancer Neg Hx    Colon polyps Neg Hx    Esophageal cancer Neg Hx    Rectal cancer Neg Hx    Stomach cancer Neg Hx    Liver disease Neg Hx    Inflammatory bowel disease Neg Hx     Social History   Socioeconomic History   Marital status: Married    Spouse name: Not on file   Number of children: Not on file   Years of education: Not on file   Highest education level: Not on file  Occupational History   Not on file  Tobacco Use   Smoking status: Former    Packs/day: 0.25    Years: 15.00    Total pack years: 3.75    Types: Cigarettes    Quit date: 01/16/1999    Years since quitting: 23.2   Smokeless tobacco: Never  Vaping Use   Vaping Use: Never used  Substance and Sexual Activity   Alcohol use: Not Currently    Alcohol/week: 0.0 standard drinks of alcohol  Comment: none in 2 years as of 05/07/18   Drug use: No   Sexual activity: Yes    Birth control/protection: None  Other Topics Concern   Not on file  Social History Narrative   Married (second marriage) has one son in [place]   Worked in Psychologist, 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    Completed 12th grade   1 dog, 1 cat   Was raised by her grandmother.    Social Determinants of Health   Financial Resource Strain: Not on file  Food Insecurity: Not on file  Transportation Needs: Not on file  Physical Activity: Not on file  Stress: Not on file  Social Connections: Not on file  Intimate Partner Violence: Not on file    Outpatient Medications Prior to Visit  Medication Sig Dispense Refill   amitriptyline (ELAVIL) 25 MG tablet TAKE 1 TABLET AT BEDTIME 90 tablet 1   atorvastatin (LIPITOR) 20 MG tablet Take 1 tablet (20 mg total) by mouth daily. 90 tablet 0   empagliflozin (JARDIANCE) 10 MG TABS tablet Take 1 tablet (10 mg total) by mouth daily. 90 tablet 1   escitalopram (LEXAPRO) 10 MG tablet Take 1.5 tablets (15 mg total) by  mouth daily. 135 tablet 1   insulin glargine (LANTUS SOLOSTAR) 100 UNIT/ML Solostar Pen Inject 13 Units into the skin at bedtime. 9 mL PRN   JANUVIA 100 MG tablet Take 1 tablet by mouth once daily 90 tablet 0   levothyroxine (SYNTHROID) 100 MCG tablet Take 1 tablet by mouth once daily 90 tablet 0   meclizine (ANTIVERT) 25 MG tablet Take 1 tablet (25 mg total) by mouth 3 (three) times daily as needed for dizziness. 30 tablet 0   pioglitazone (ACTOS) 30 MG tablet Take 1 tablet (30 mg total) by mouth daily. 90 tablet 1   No facility-administered medications prior to visit.    Allergies  Allergen Reactions   Tramadol Itching    ROS See HPI    Objective:    Physical Exam Constitutional:      General: She is not in acute distress.    Appearance: Normal appearance. She is well-developed.  HENT:     Head: Normocephalic and atraumatic.     Right Ear: Ear canal and external ear normal. There is impacted cerumen. Tympanic membrane is not injected or scarred.     Left Ear: Tympanic membrane, ear canal and external ear normal.  Eyes:     General: No scleral icterus. Neck:     Thyroid: No thyromegaly.  Cardiovascular:     Rate and Rhythm: Normal rate and regular rhythm.     Heart sounds: Normal heart sounds. No murmur heard. Pulmonary:     Effort: Pulmonary effort is normal. No respiratory distress.     Breath sounds: Normal breath sounds. No wheezing.  Musculoskeletal:     Cervical back: Neck supple.  Skin:    General: Skin is warm and dry.  Neurological:     Mental Status: She is alert and oriented to person, place, and time.  Psychiatric:        Mood and Affect: Mood normal.        Behavior: Behavior normal.        Thought Content: Thought content normal.        Judgment: Judgment normal.     BP 124/78 (BP Location: Right Arm, Patient Position: Sitting, Cuff Size: Small)   Pulse  89   Temp 97.6 F (36.4 C) (Oral)   Resp 16   Wt 140 lb (63.5 kg)   LMP 01/26/2001   SpO2  98%   BMI 24.80 kg/m  Wt Readings from Last 3 Encounters:  03/26/22 140 lb (63.5 kg)  03/05/22 141 lb (64 kg)  01/03/22 141 lb (64 kg)       Assessment & Plan:   Problem List Items Addressed This Visit       Unprioritized   Ceruminosis, right - Primary    Ceruminosis is noted.  After verbal consent was obtained wax was carefully removed by CMA using syringing. Instructions for home care to prevent wax buildup are given. Discussed she could try adding some claritin once daily to see if it helps with her pressure in her right ear.         I am having Lillyn K. Serpas maintain her pioglitazone, empagliflozin, meclizine, amitriptyline, escitalopram, Lantus SoloStar, Januvia, atorvastatin, and levothyroxine.  No orders of the defined types were placed in this encounter.

## 2022-04-17 ENCOUNTER — Other Ambulatory Visit: Payer: Self-pay | Admitting: Family

## 2022-04-18 ENCOUNTER — Other Ambulatory Visit: Payer: Self-pay | Admitting: Family

## 2022-05-07 ENCOUNTER — Other Ambulatory Visit: Payer: Self-pay | Admitting: Family

## 2022-05-08 ENCOUNTER — Encounter: Payer: Self-pay | Admitting: *Deleted

## 2022-05-08 ENCOUNTER — Encounter: Payer: Self-pay | Admitting: Family

## 2022-05-08 ENCOUNTER — Other Ambulatory Visit: Payer: Self-pay | Admitting: Family

## 2022-05-08 MED ORDER — AMITRIPTYLINE HCL 25 MG PO TABS: 25.0000 mg | ORAL_TABLET | Freq: Every day | ORAL | 0 refills | Status: DC

## 2022-05-14 ENCOUNTER — Other Ambulatory Visit: Payer: Self-pay | Admitting: Family

## 2022-05-14 MED ORDER — SITAGLIPTIN PHOSPHATE 100 MG PO TABS: 100.0000 mg | ORAL_TABLET | Freq: Every day | ORAL | 0 refills | Status: DC

## 2022-05-22 ENCOUNTER — Telehealth: Payer: Self-pay

## 2022-05-22 ENCOUNTER — Ambulatory Visit: Payer: BC Managed Care – PPO | Admitting: Family

## 2022-05-22 VITALS — BP 129/78 | HR 83 | Temp 98.0°F | Resp 16 | Wt 147.0 lb

## 2022-05-22 DIAGNOSIS — E119 Type 2 diabetes mellitus without complications: Secondary | ICD-10-CM

## 2022-05-22 DIAGNOSIS — E785 Hyperlipidemia, unspecified: Secondary | ICD-10-CM

## 2022-05-22 DIAGNOSIS — E039 Hypothyroidism, unspecified: Secondary | ICD-10-CM

## 2022-05-22 DIAGNOSIS — F419 Anxiety disorder, unspecified: Secondary | ICD-10-CM | POA: Diagnosis not present

## 2022-05-22 DIAGNOSIS — E1165 Type 2 diabetes mellitus with hyperglycemia: Secondary | ICD-10-CM | POA: Diagnosis not present

## 2022-05-22 MED ORDER — SITAGLIPTIN PHOSPHATE 100 MG PO TABS: 100.0000 mg | ORAL_TABLET | Freq: Every day | ORAL | 0 refills | Status: DC

## 2022-05-22 MED ORDER — EMPAGLIFLOZIN 10 MG PO TABS: 10.0000 mg | ORAL_TABLET | Freq: Every day | ORAL | 1 refills | Status: DC

## 2022-05-22 MED ORDER — PIOGLITAZONE HCL 30 MG PO TABS
30.0000 mg | ORAL_TABLET | Freq: Every day | ORAL | 1 refills | Status: DC
Start: 2022-05-22 — End: 2023-01-14

## 2022-05-22 MED ORDER — ESCITALOPRAM OXALATE 10 MG PO TABS: 15.0000 mg | ORAL_TABLET | Freq: Every day | ORAL | 1 refills | Status: DC

## 2022-05-22 NOTE — Telephone Encounter (Signed)
PA initiated via Covermymeds; KEY: [id number]. PA approved.   PA Case: [id number], Status: Approved, Coverage Starts on: 05/22/2022 12:00:00 AM, Coverage Ends on: 05/22/2023 12:00:00 AM. Authorization Expiration Date: 05/21/2023

## 2022-05-22 NOTE — Progress Notes (Signed)
Subjective:   By signing my name below, I, Isabelle Course, attest that this documentation has been prepared under the direction and in the presence of Lemont Fillers, NP 05/22/22   Patient ID: Donna Anderson, female    DOB: [birth year], 61 y.o.   MRN: [id number]  Chief Complaint  Patient presents with   Diabetes    Here for follow up   Hypothyroidism    Here for follow up    HPI Patient is in today for an office visit.   Diabetes: Her last A1C from 09/2021 was 7.4 compared to 8.0 from 05/2021. She monitors her blood sugar levels after work and before bed. She states her blood sugar averages in the 120s but sometimes will spike as high as in 300s. She notes it is higher at night time. Dinner tends to be her largest meal. She has been trying to eat healthier. She is compliant with 10 mg Jardiance daily and 100 mg Januvia daily.  Lab Results  Component Value Date   HGBA1C 7.4 (H) 09/15/2021   Hypothyroidism: She is compliant with 100 mcg Synthroid daily.   Gyn: She is currenlty not seeing gyn for pap smears.   Toe pain: She reports distal joint pain on her right great toe. She attributes this to being on her feet 8 hours a day while at work. She has gotten new shoes to help with her foot pain.    Past Medical History:  Diagnosis Date   Allergy    Anemia    Anxiety    Aortic atherosclerosis (HCC)    Arthritis    Depression    Diabetes type 2, controlled (HCC) 06/21/2016   Diverticulosis 2015   CT SCAN    GERD (gastroesophageal reflux disease)    Graves disease 1997   Hearing loss    Hepatic steatosis    History of chicken pox    History of migraine    Hyperlipidemia    Hypertriglyceridemia 06/21/2016   Internal hemorrhoids    Kidney stones    Osteopenia 07/08/2016   Osteoporosis    osteopenia   Tubular adenoma of colon     Past Surgical History:  Procedure Laterality Date   APPENDECTOMY  2001   COLONOSCOPY     ELBOW SURGERY Right 2016   ENDOMETRIAL ABLATION   2003   HIP FRACTURE SURGERY Right 2009   fell playing with dog   labrum repair Right 2011   NASAL SINUS SURGERY     1998   OOPHORECTOMY Right 2001   due to large ovarian cyst   ovarian cysts     removed x 4 before the oophorectomy   POLYPECTOMY     THYROIDECTOMY  1997    Family History  Problem Relation Age of Onset   Hyperlipidemia Father    Hypertension Father    Heart failure Father 54   Diabetes Sister    Arthritis Paternal Grandmother    Kidney disease Paternal Grandmother    Diabetes Paternal Grandmother    Arthritis Paternal Grandfather    Hepatitis C Son    Arthritis Son    Drug abuse Son    Colon cancer Neg Hx    Colon polyps Neg Hx    Esophageal cancer Neg Hx    Rectal cancer Neg Hx    Stomach cancer Neg Hx    Liver disease Neg Hx    Inflammatory bowel disease Neg Hx     Social History   Socioeconomic History  Marital status: Married    Spouse name: Not on file   Number of children: Not on file   Years of education: Not on file   Highest education level: Not on file  Occupational History   Not on file  Tobacco Use   Smoking status: Former    Packs/day: 0.25    Years: 15.00    Additional pack years: 0.00    Total pack years: 3.75    Types: Cigarettes    Quit date: 01/16/1999    Years since quitting: 23.3   Smokeless tobacco: Never  Vaping Use   Vaping Use: Never used  Substance and Sexual Activity   Alcohol use: Not Currently    Alcohol/week: 0.0 standard drinks of alcohol    Comment: none in 2 years as of 05/07/18   Drug use: No   Sexual activity: Yes    Birth control/protection: None  Other Topics Concern   Not on file  Social History Narrative   Married (second marriage) has one son in [place]   Worked in Set designer   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    Completed 12th grade   1 dog, 1 cat   Was raised by her grandmother.    Social Determinants of Health   Financial  Resource Strain: Low Risk  (05/19/2022)   Overall Financial Resource Strain (CARDIA)    Difficulty of Paying Living Expenses: Not hard at all  Food Insecurity: No Food Insecurity (05/19/2022)   Hunger Vital Sign    Worried About Running Out of Food in the Last Year: Never true    Ran Out of Food in the Last Year: Never true  Transportation Needs: No Transportation Needs (05/19/2022)   PRAPARE - Administrator, Civil Service (Medical): No    Lack of Transportation (Non-Medical): No  Physical Activity: Unknown (05/19/2022)   Exercise Vital Sign    Days of Exercise per Week: 5 days    Minutes of Exercise per Session: Patient declined  Stress: No Stress Concern Present (05/19/2022)   [employer] of Occupational Health - Occupational Stress Questionnaire    Feeling of Stress : Not at all  Social Connections: Unknown (05/19/2022)   Social Connection and Isolation Panel [NHANES]    Frequency of Communication with Friends and Family: Patient declined    Frequency of Social Gatherings with Friends and Family: Patient declined    Attends Religious Services: More than 4 times per year    Active Member of [employer] or Organizations: No    Attends Engineer, structural: Not on file    Marital Status: Married  Catering manager Violence: Not on file    Outpatient Medications Prior to Visit  Medication Sig Dispense Refill   amitriptyline (ELAVIL) 25 MG tablet Take 1 tablet (25 mg total) by mouth at bedtime. 90 tablet 0   atorvastatin (LIPITOR) 20 MG tablet Take 1 tablet (20 mg total) by mouth daily. 90 tablet 0   insulin glargine (LANTUS SOLOSTAR) 100 UNIT/ML Solostar Pen Inject 13 Units into the skin at bedtime. 9 mL PRN   levothyroxine (SYNTHROID) 100 MCG tablet Take 1 tablet by mouth once daily 90 tablet 0   meclizine (ANTIVERT) 25 MG tablet Take 1 tablet (25 mg total) by mouth 3 (three) times daily as needed for dizziness. 30 tablet 0   empagliflozin (JARDIANCE) 10 MG TABS tablet  Take 1 tablet (10 mg total) by mouth daily. 90 tablet  1   escitalopram (LEXAPRO) 10 MG tablet Take 1.5 tablets (15 mg total) by mouth daily. 135 tablet 1   pioglitazone (ACTOS) 30 MG tablet Take 1 tablet (30 mg total) by mouth daily. 90 tablet 1   sitaGLIPtin (JANUVIA) 100 MG tablet Take 1 tablet (100 mg total) by mouth daily. 90 tablet 0   No facility-administered medications prior to visit.    Allergies  Allergen Reactions   Tramadol Itching    Review of Systems  Musculoskeletal:  Positive for joint pain (right great toe -- see HPI).       Objective:    Physical Exam Constitutional:      General: She is not in acute distress.    Appearance: Normal appearance. She is not ill-appearing.  HENT:     Head: Normocephalic and atraumatic.     Right Ear: External ear normal.     Left Ear: External ear normal.  Eyes:     Extraocular Movements: Extraocular movements intact.     Pupils: Pupils are equal, round, and reactive to light.  Cardiovascular:     Rate and Rhythm: Normal rate and regular rhythm.     Heart sounds: Normal heart sounds. No murmur heard.    No gallop.  Pulmonary:     Effort: Pulmonary effort is normal. No respiratory distress.     Breath sounds: Normal breath sounds. No wheezing or rales.  Skin:    General: Skin is warm and dry.  Neurological:     Mental Status: She is alert and oriented to person, place, and time.  Psychiatric:        Judgment: Judgment normal.    Diabetic Foot Exam - Simple   Simple Foot Form Diabetic Foot exam was performed with the following findings: Yes 05/22/2022  4:34 PM  Visual Inspection No deformities, no ulcerations, no other skin breakdown bilaterally: Yes Sensation Testing Intact to touch and monofilament testing bilaterally: Yes Pulse Check Posterior Tibialis and Dorsalis pulse intact bilaterally: Yes Comments      BP 129/78 (BP Location: Right Arm, Patient Position: Sitting, Cuff Size: Small)   Pulse 83   Temp 98  F (36.7 C) (Oral)   Resp 16   Wt 147 lb (66.7 kg)   LMP 01/26/2001   SpO2 98%   BMI 26.04 kg/m  Wt Readings from Last 3 Encounters:  05/22/22 147 lb (66.7 kg)  03/26/22 140 lb (63.5 kg)  03/05/22 141 lb (64 kg)       Assessment & Plan:  Diabetes mellitus type 2 in nonobese (HCC) -     Hemoglobin A1c -     Pioglitazone HCl; Take 1 tablet (30 mg total) by mouth daily.  Dispense: 90 tablet; Refill: 1 -     Empagliflozin; Take 1 tablet (10 mg total) by mouth daily.  Dispense: 90 tablet; Refill: 1 -     SITagliptin Phosphate; Take 1 tablet (100 mg total) by mouth daily.  Dispense: 90 tablet; Refill: 0 -     Comprehensive metabolic panel -     Ambulatory referral to Obstetrics / Gynecology  Uncontrolled type 2 diabetes mellitus with hyperglycemia [facility]) Assessment & Plan: Lab Results  Component Value Date   HGBA1C 7.4 (H) 09/15/2021   HGBA1C 8.0 (H) 06/02/2021   HGBA1C 7.3 (H) 03/03/2021   Lab Results  Component Value Date   MICROALBUR 3.0 (H) 07/28/2021   LDLCALC 102 (H) 09/15/2021   CREATININE 0.83 09/19/2021   Variable sugars at home.  On actos,  jardiance, januvia, lantus.  Will check A1C. If elevated consider adding meal coverage with dinner.    Hypothyroidism, unspecified type Assessment & Plan: Clinically stable on synthroid.  Lab Results  Component Value Date   TSH 1.46 07/28/2021   Clinically stable on synthroid. Will obtain follow up TSH.   Orders: -     TSH  Hyperlipidemia, unspecified hyperlipidemia type Assessment & Plan: Lab Results  Component Value Date   CHOL 168 09/15/2021   HDL 45.10 09/15/2021   LDLCALC 102 (H) 09/15/2021   LDLDIRECT 106.0 04/15/2020   TRIG 105.0 09/15/2021   CHOLHDL 4 09/15/2021   LDL nearly at goal, continue statin.    Anxiety Assessment & Plan: Mood stable on lexapro, continue same.    Other orders -     Escitalopram Oxalate; Take 1.5 tablets (15 mg total) by mouth daily.  Dispense: 135 tablet; Refill:  1     I,Rachel Rivera,acting as a scribe for Lemont Fillers, NP.,have documented all relevant documentation on the behalf of Lemont Fillers, NP,as directed by  Lemont Fillers, NP while in the presence of Lemont Fillers, NP.   I, Lemont Fillers, NP, personally preformed the services described in this documentation.  All medical record entries made by the scribe were at my direction and in my presence.  I have reviewed the chart and discharge instructions (if applicable) and agree that the record reflects my personal performance and is accurate and complete. 05/22/22   Lemont Fillers, NP

## 2022-05-22 NOTE — Telephone Encounter (Signed)
PA initiated via Covermymeds; KEY: [id number]. Awaiting determination.

## 2022-05-22 NOTE — Assessment & Plan Note (Addendum)
Lab Results  Component Value Date   HGBA1C 7.4 (H) 09/15/2021   HGBA1C 8.0 (H) 06/02/2021   HGBA1C 7.3 (H) 03/03/2021   Lab Results  Component Value Date   MICROALBUR 3.0 (H) 07/28/2021   LDLCALC 102 (H) 09/15/2021   CREATININE 0.83 09/19/2021   Variable sugars at home.  On actos, jardiance, januvia, lantus.  Will check A1C. If elevated consider adding meal coverage with dinner.

## 2022-05-22 NOTE — Telephone Encounter (Signed)
PA approved.   PA Case: [id number], Status: Approved, Coverage Starts on: 05/22/2022 12:00:00 AM, Coverage Ends on: 05/22/2023 12:00:00 AM. Authorization Expiration Date: 05/21/2023

## 2022-05-22 NOTE — Assessment & Plan Note (Signed)
Clinically stable on synthroid.  Lab Results  Component Value Date   TSH 1.46 07/28/2021   Clinically stable on synthroid. Will obtain follow up TSH.

## 2022-05-22 NOTE — Assessment & Plan Note (Signed)
Mood stable on lexapro, continue same.

## 2022-05-22 NOTE — Assessment & Plan Note (Signed)
Lab Results  Component Value Date   CHOL 168 09/15/2021   HDL 45.10 09/15/2021   LDLCALC 102 (H) 09/15/2021   LDLDIRECT 106.0 04/15/2020   TRIG 105.0 09/15/2021   CHOLHDL 4 09/15/2021   LDL nearly at goal, continue statin.

## 2022-05-23 LAB — COMPREHENSIVE METABOLIC PANEL
ALT: 17 U/L (ref 0–35)
AST: 18 U/L (ref 0–37)
Albumin: 4.2 g/dL (ref 3.5–5.2)
Alkaline Phosphatase: 97 U/L (ref 39–117)
BUN: 25 mg/dL — ABNORMAL HIGH (ref 6–23)
CO2: 28 mEq/L (ref 19–32)
Calcium: 8.9 mg/dL (ref 8.4–10.5)
Chloride: 99 mEq/L (ref 96–112)
Creatinine, Ser: 0.76 mg/dL (ref 0.40–1.20)
GFR: 84.99 mL/min (ref >60.00)
Glucose, Bld: 116 mg/dL — ABNORMAL HIGH (ref 70–99)
Potassium: 4.3 mEq/L (ref 3.5–5.1)
Sodium: 137 mEq/L (ref 135–145)
Total Bilirubin: 0.4 mg/dL (ref 0.2–1.2)
Total Protein: 7 g/dL (ref 6.0–8.3)

## 2022-05-23 LAB — TSH: TSH: 1.75 u[IU]/mL (ref 0.35–5.50)

## 2022-05-23 LAB — HEMOGLOBIN A1C: Hgb A1c MFr Bld: 8.2 % — ABNORMAL HIGH (ref 4.6–6.5)

## 2022-05-25 ENCOUNTER — Encounter: Payer: Self-pay | Admitting: Family

## 2022-05-25 ENCOUNTER — Telehealth: Payer: Self-pay

## 2022-05-25 ENCOUNTER — Other Ambulatory Visit: Payer: Self-pay | Admitting: Family

## 2022-05-25 MED ORDER — INSULIN LISPRO 100 UNIT/ML IJ SOLN: INTRAMUSCULAR | 5 refills | Status: DC

## 2022-05-25 MED ORDER — NOVOLOG FLEXPEN 100 UNIT/ML ~~LOC~~ SOPN: PEN_INJECTOR | SUBCUTANEOUS | 5 refills | Status: DC

## 2022-05-25 NOTE — Telephone Encounter (Signed)
Novolog not covered by plan. Switched to Humalog.

## 2022-06-04 ENCOUNTER — Other Ambulatory Visit: Payer: Self-pay | Admitting: Family

## 2022-06-04 DIAGNOSIS — E039 Hypothyroidism, unspecified: Secondary | ICD-10-CM

## 2022-06-14 ENCOUNTER — Encounter: Payer: Self-pay | Admitting: Family

## 2022-06-15 ENCOUNTER — Ambulatory Visit (HOSPITAL_BASED_OUTPATIENT_CLINIC_OR_DEPARTMENT_OTHER)
Admission: RE | Admit: 2022-06-15 | Discharge: 2022-06-15 | Disposition: A | Discharge status: Home / Self Care | Payer: BC Managed Care – PPO | Source: Ambulatory Visit | Attending: Family | Admitting: Family

## 2022-06-15 ENCOUNTER — Other Ambulatory Visit (HOSPITAL_BASED_OUTPATIENT_CLINIC_OR_DEPARTMENT_OTHER): Payer: Self-pay

## 2022-06-15 ENCOUNTER — Ambulatory Visit: Payer: BC Managed Care – PPO | Admitting: Family

## 2022-06-15 VITALS — BP 124/77 | HR 79 | Temp 97.6°F | Resp 16 | Wt 139.0 lb

## 2022-06-15 DIAGNOSIS — R918 Other nonspecific abnormal finding of lung field: Secondary | ICD-10-CM | POA: Diagnosis not present

## 2022-06-15 DIAGNOSIS — J209 Acute bronchitis, unspecified: Secondary | ICD-10-CM | POA: Insufficient documentation

## 2022-06-15 DIAGNOSIS — R059 Cough, unspecified: Secondary | ICD-10-CM | POA: Diagnosis not present

## 2022-06-15 DIAGNOSIS — J40 Bronchitis, not specified as acute or chronic: Secondary | ICD-10-CM | POA: Diagnosis not present

## 2022-06-15 MED ORDER — AZITHROMYCIN 250 MG PO TABS
ORAL_TABLET | ORAL | 0 refills | Status: AC
  Filled 2022-06-15: qty 6, 5d supply, fill #0

## 2022-06-15 MED ORDER — BENZONATATE 100 MG PO CAPS
100.0000 mg | ORAL_CAPSULE | Freq: Three times a day (TID) | ORAL | 0 refills | Status: DC | PRN
  Filled 2022-06-15: qty 20, 7d supply, fill #0

## 2022-06-15 NOTE — Telephone Encounter (Signed)
Patient scheduled to be here at 1 pm

## 2022-06-15 NOTE — Assessment & Plan Note (Signed)
1 week hx of bronchitis symptoms.  CXR negative for pneumonia.  Recommended rx with zpak, tessalon, mucinex prn and tylenol prn.  Pt is advised to call if symptoms worsen or if symptoms fail to improve.

## 2022-06-15 NOTE — Progress Notes (Signed)
Subjective:   By signing my name below, I, Donna Anderson, attest that this documentation has been prepared under the direction and in the presence of Lemont Fillers, NP 06/15/22   Patient ID: Donna Anderson, female    DOB: 06-12-1960, 62 y.o.   MRN: [id number]  Chief Complaint  Patient presents with   Cough    Complains of persistent cough for about 1 week, negative covid test 5/28   Nasal Congestion    Complains of nasal congestion   Fatigue    Complains of having no energy    HPI Patient is in today for an office visit.   Cough: She complains of a persistent cough, sore throat, nasal congestion, and fatigue for 1 week. She states chest pain when breathing. She has been unable to sleep well due to her cough, and can only sleep while sitting up. She tested negative for Covid on 5/28 using an at home Covid test. Denies any fever.   Past Medical History:  Diagnosis Date   Allergy    Anemia    Anxiety    Aortic atherosclerosis (HCC)    Arthritis    Depression    Diabetes type 2, controlled (HCC) 06/21/2016   Diverticulosis 2015   CT SCAN    GERD (gastroesophageal reflux disease)    Graves disease 1997   Hearing loss    Hepatic steatosis    History of chicken pox    History of migraine    Hyperlipidemia    Hypertriglyceridemia 06/21/2016   Internal hemorrhoids    Kidney stones    Osteopenia 07/08/2016   Osteoporosis    osteopenia   Tubular adenoma of colon     Past Surgical History:  Procedure Laterality Date   APPENDECTOMY  2001   COLONOSCOPY     ELBOW SURGERY Right 2016   ENDOMETRIAL ABLATION  2003   HIP FRACTURE SURGERY Right 2009   fell playing with dog   labrum repair Right 2011   NASAL SINUS SURGERY     1998   OOPHORECTOMY Right 2001   due to large ovarian cyst   ovarian cysts     removed x 4 before the oophorectomy   POLYPECTOMY     THYROIDECTOMY  1997    Family History  Problem Relation Age of Onset   Hyperlipidemia Father    Hypertension  Father    Heart failure Father 78   Diabetes Sister    Arthritis Paternal Grandmother    Kidney disease Paternal Grandmother    Diabetes Paternal Grandmother    Arthritis Paternal Grandfather    Hepatitis C Son    Arthritis Son    Drug abuse Son    Colon cancer Neg Hx    Colon polyps Neg Hx    Esophageal cancer Neg Hx    Rectal cancer Neg Hx    Stomach cancer Neg Hx    Liver disease Neg Hx    Inflammatory bowel disease Neg Hx     Social History   Socioeconomic History   Marital status: Married    Spouse name: Not on file   Number of children: Not on file   Years of education: Not on file   Highest education level: Not on file  Occupational History   Not on file  Tobacco Use   Smoking status: Former    Packs/day: 0.25    Years: 15.00    Additional pack years: 0.00    Total pack years: 3.75  Types: Cigarettes    Quit date: 01/16/1999    Years since quitting: 23.4   Smokeless tobacco: Never  Vaping Use   Vaping Use: Never used  Substance and Sexual Activity   Alcohol use: Not Currently    Alcohol/week: 0.0 standard drinks of alcohol    Comment: none in 2 years as of 05/07/18   Drug use: No   Sexual activity: Yes    Birth control/protection: None  Other Topics Concern   Not on file  Social History Narrative   Married (second marriage) has one son in [place]   Worked in Set designer   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    Completed 12th grade   1 dog, 1 cat   Was raised by her grandmother.    Social Determinants of Health   Financial Resource Strain: Low Risk  (05/19/2022)   Overall Financial Resource Strain (CARDIA)    Difficulty of Paying Living Expenses: Not hard at all  Food Insecurity: No Food Insecurity (05/19/2022)   Hunger Vital Sign    Worried About Running Out of Food in the Last Year: Never true    Ran Out of Food in the Last Year: Never true  Transportation Needs: No Transportation Needs  (05/19/2022)   PRAPARE - Administrator, Civil Service (Medical): No    Lack of Transportation (Non-Medical): No  Physical Activity: Unknown (05/19/2022)   Exercise Vital Sign    Days of Exercise per Week: 5 days    Minutes of Exercise per Session: Patient declined  Stress: No Stress Concern Present (05/19/2022)   [employer] of Occupational Health - Occupational Stress Questionnaire    Feeling of Stress : Not at all  Social Connections: Unknown (05/19/2022)   Social Connection and Isolation Panel [NHANES]    Frequency of Communication with Friends and Family: Patient declined    Frequency of Social Gatherings with Friends and Family: Patient declined    Attends Religious Services: More than 4 times per year    Active Member of [employer] or Organizations: No    Attends Engineer, structural: Not on file    Marital Status: Married  Catering manager Violence: Not on file    Outpatient Medications Prior to Visit  Medication Sig Dispense Refill   amitriptyline (ELAVIL) 25 MG tablet Take 1 tablet (25 mg total) by mouth at bedtime. 90 tablet 0   atorvastatin (LIPITOR) 20 MG tablet Take 1 tablet (20 mg total) by mouth daily. 90 tablet 0   empagliflozin (JARDIANCE) 10 MG TABS tablet Take 1 tablet (10 mg total) by mouth daily. 90 tablet 1   escitalopram (LEXAPRO) 10 MG tablet Take 1.5 tablets (15 mg total) by mouth daily. 135 tablet 1   insulin glargine (LANTUS SOLOSTAR) 100 UNIT/ML Solostar Pen Inject 13 Units into the skin at bedtime. 9 mL PRN   insulin lispro (HUMALOG) 100 UNIT/ML injection Inject prior to dinner using sliding scale 0-12 units based on blood sugar reading 3 mL 5   levothyroxine (SYNTHROID) 100 MCG tablet Take 1 tablet by mouth once daily 90 tablet 0   meclizine (ANTIVERT) 25 MG tablet Take 1 tablet (25 mg total) by mouth 3 (three) times daily as needed for dizziness. 30 tablet 0   pioglitazone (ACTOS) 30 MG tablet Take 1 tablet (30 mg total) by mouth daily. 90  tablet 1   sitaGLIPtin (JANUVIA) 100 MG tablet Take 1 tablet (100 mg total) by  mouth daily. 90 tablet 0   No facility-administered medications prior to visit.    Allergies  Allergen Reactions   Tramadol Itching    Review of Systems  HENT:  Positive for congestion (nasal) and sore throat.   Respiratory:  Positive for cough (persistent).        Objective:    Physical Exam Constitutional:      Appearance: Normal appearance.  HENT:     Right Ear: Tympanic membrane and ear canal normal.     Left Ear: Tympanic membrane and ear canal normal.     Mouth/Throat:     Mouth: Mucous membranes are moist.     Pharynx: No oropharyngeal exudate or posterior oropharyngeal erythema.  Eyes:     Pupils: Pupils are equal, round, and reactive to light.  Cardiovascular:     Rate and Rhythm: Normal rate and regular rhythm.     Heart sounds: No murmur heard. Pulmonary:     Effort: Pulmonary effort is normal.     Breath sounds: Examination of the left-middle field reveals wheezing and rales. Examination of the left-lower field reveals wheezing and rales. Wheezing and rales present.  Skin:    General: Skin is warm and dry.  Neurological:     Mental Status: She is alert.  Psychiatric:        Mood and Affect: Mood normal.     BP 124/77 (BP Location: Right Arm, Patient Position: Sitting, Cuff Size: Small)   Pulse 79   Temp 97.6 F (36.4 C) (Oral)   Resp 16   Wt 139 lb (63 kg)   LMP 01/26/2001   SpO2 99%   BMI 24.62 kg/m  Wt Readings from Last 3 Encounters:  06/15/22 139 lb (63 kg)  05/22/22 147 lb (66.7 kg)  03/26/22 140 lb (63.5 kg)       Assessment & Plan:  Bronchitis Assessment & Plan: 1 week hx of bronchitis symptoms.  CXR negative for pneumonia.  Recommended rx with zpak, tessalon, mucinex prn and tylenol prn.  Pt is advised to call if symptoms worsen or if symptoms fail to improve.   Orders: -     DG Chest 2 View; Future  Other orders -     Benzonatate; Take 1 capsule  (100 mg total) by mouth 3 (three) times daily as needed.  Dispense: 20 capsule; Refill: 0 -     Azithromycin; Take 2 tablets on day 1, then 1 tablet daily on days 2 through 5  Dispense: 6 tablet; Refill: 0     I,Rachel Rivera,acting as a scribe for Lemont Fillers, NP.,have documented all relevant documentation on the behalf of Lemont Fillers, NP,as directed by  Lemont Fillers, NP while in the presence of Lemont Fillers, NP.   I, Lemont Fillers, NP, personally preformed the services described in this documentation.  All medical record entries made by the scribe were at my direction and in my presence.  I have reviewed the chart and discharge instructions (if applicable) and agree that the record reflects my personal performance and is accurate and complete. 06/15/22   Lemont Fillers, NP

## 2022-06-29 ENCOUNTER — Other Ambulatory Visit: Payer: Self-pay

## 2022-06-30 ENCOUNTER — Other Ambulatory Visit: Payer: Self-pay | Admitting: Family

## 2022-06-30 ENCOUNTER — Encounter: Payer: Self-pay | Admitting: Family

## 2022-07-04 ENCOUNTER — Encounter: Payer: Self-pay | Admitting: Family

## 2022-07-04 MED ORDER — LANTUS SOLOSTAR 100 UNIT/ML ~~LOC~~ SOPN: 13.0000 [IU] | PEN_INJECTOR | Freq: Every day | SUBCUTANEOUS | 1 refills | Status: DC

## 2022-07-24 ENCOUNTER — Other Ambulatory Visit: Payer: Self-pay | Admitting: Family

## 2022-07-25 MED ORDER — ATORVASTATIN CALCIUM 20 MG PO TABS: 20.0000 mg | ORAL_TABLET | Freq: Every day | ORAL | 0 refills | Status: DC

## 2022-07-31 ENCOUNTER — Encounter: Payer: Self-pay | Admitting: Obstetrics and Gynecology

## 2022-07-31 ENCOUNTER — Ambulatory Visit: Payer: BC Managed Care – PPO | Admitting: Obstetrics and Gynecology

## 2022-07-31 ENCOUNTER — Other Ambulatory Visit (HOSPITAL_COMMUNITY)
Admission: RE | Admit: 2022-07-31 | Discharge: 2022-07-31 | Disposition: A | Discharge status: Home / Self Care | Payer: BC Managed Care – PPO | Source: Ambulatory Visit | Attending: Obstetrics and Gynecology | Admitting: Obstetrics and Gynecology

## 2022-07-31 VITALS — BP 125/73 | HR 87 | Ht 63.0 in | Wt 139.0 lb

## 2022-07-31 DIAGNOSIS — Z124 Encounter for screening for malignant neoplasm of cervix: Secondary | ICD-10-CM | POA: Insufficient documentation

## 2022-07-31 DIAGNOSIS — Z01419 Encounter for gynecological examination (general) (routine) without abnormal findings: Secondary | ICD-10-CM

## 2022-07-31 NOTE — Progress Notes (Signed)
ANNUAL EXAM Patient name: Donna Anderson MRN [id number]  Date of birth: 06-19-1960 Chief Complaint:   Gynecologic Exam  History of Present Illness:   Donna Anderson is a 62 y.o. G49P1001  female being seen today for a routine annual exam.  Current complaints: none  Patient's last menstrual period was 01/26/2001.   The pregnancy intention screening data noted above was reviewed. Potential methods of contraception were discussed. The patient elected to proceed with No data recorded.   Last pap 06/15/2016. Results were: NILM w/ HRHPV negative. H/O abnormal pap: no Last mammogram: 09/19/21. Results were: normal. Family h/o breast cancer:yes, sister dx at age 69 Last colonoscopy: 03/05/22. Results were: polyps and mild diverticulosis. Family h/o colorectal cancer: no     07/31/2022    8:25 AM 05/22/2022    4:13 PM 03/26/2022   10:21 AM 11/25/2020   10:43 AM 07/24/2019    9:47 AM  Depression screen PHQ 2/9  Decreased Interest 1 0 0 0 0  Down, Depressed, Hopeless 0 0 0 0 0  PHQ - 2 Score 1 0 0 0 0  Altered sleeping 0 0   1  Tired, decreased energy 1 0   1  Change in appetite 1 0   1  Feeling bad or failure about yourself  0 0   0  Trouble concentrating 1 0   0  Moving slowly or fidgety/restless 0 0   0  Suicidal thoughts 0 0   0  PHQ-9 Score 4 0   3  Difficult doing work/chores  Not difficult at all           07/31/2022    8:25 AM 06/02/2021    9:32 AM  GAD 7 : Generalized Anxiety Score  Nervous, Anxious, on Edge 1 1  Control/stop worrying 0 2  Worry too much - different things 0 2  Trouble relaxing 0 2  Restless 0 0  Easily annoyed or irritable 1 1  Afraid - awful might happen 0 1  Total GAD 7 Score 2 9  Anxiety Difficulty  Very difficult     Review of Systems:   Pertinent items are noted in HPI Denies any headaches, blurred vision, fatigue, shortness of breath, chest pain, abdominal pain, abnormal vaginal discharge/itching/odor/irritation, problems with periods, bowel  movements, urination, or intercourse unless otherwise stated above. Pertinent History Reviewed:  Reviewed past medical,surgical, social and family history.  Reviewed problem list, medications and allergies. Physical Assessment:   Vitals:   07/31/22 0824  BP: 125/73  Pulse: 87  Weight: 139 lb (63 kg)  Height: 5\' 3"  (1.6 m)  Body mass index is 24.62 kg/m.        Physical Examination:   General appearance - well appearing, and in no distress  Mental status - alert, oriented to person, place, and time  Psych:  She has a normal mood and affect  Skin - warm and dry, normal color, no suspicious lesions noted  Chest - effort normal, all lung fields clear to auscultation bilaterally  Heart - normal rate and regular rhythm  Neck:  midline trachea, no thyromegaly or nodules  Abdomen - soft, nontender, nondistended, no masses or organomegaly  Pelvic - VULVA: normal appearing vulva with no masses, tenderness or lesions  VAGINA: normal appearing vagina with normal color and discharge, no lesions  CERVIX: normal appearing cervix without discharge or lesions, no CMT  Thin prep pap is done with HR HPV cotesting  Extremities:  No swelling or  varicosities noted  Chaperone present for exam  No results found for this or any previous visit (from the past 24 hour(s)).  Assessment & Plan:  1. Well woman exam with routine gynecological exam  2. Cervical cancer screening - Cytology - PAP( [facility])    Mammogram: due in september Colonoscopy: per GI, or sooner if problems  No orders of the defined types were placed in this encounter.   Meds: No orders of the defined types were placed in this encounter.   Albertine Grates, FNP

## 2022-07-31 NOTE — Addendum Note (Signed)
Addended by: Sue Lush on: 07/31/2022 09:20 AM   Modules accepted: Level of Service

## 2022-08-01 LAB — CYTOLOGY - PAP
Comment: NEGATIVE
Diagnosis: NEGATIVE
High risk HPV: NEGATIVE

## 2022-08-10 ENCOUNTER — Other Ambulatory Visit: Payer: Self-pay | Admitting: Family

## 2022-08-10 MED ORDER — AMITRIPTYLINE HCL 25 MG PO TABS: 25.0000 mg | ORAL_TABLET | Freq: Every day | ORAL | 0 refills | Status: DC

## 2022-09-01 ENCOUNTER — Other Ambulatory Visit: Payer: Self-pay | Admitting: Family

## 2022-09-01 DIAGNOSIS — E039 Hypothyroidism, unspecified: Secondary | ICD-10-CM

## 2022-09-12 ENCOUNTER — Telehealth: Payer: Self-pay | Admitting: Family

## 2022-09-12 ENCOUNTER — Encounter: Payer: Self-pay | Admitting: Family

## 2022-09-12 NOTE — Telephone Encounter (Signed)
Pt dropped off forms to be completed by PCP. Forms are for approval for her to have water on the floor at work since she is diabetic. Pt said she could answer any questions via phone if needed.Pt asks to be called when faxed to number on forms.

## 2022-09-25 ENCOUNTER — Encounter: Payer: Self-pay | Admitting: Family

## 2022-09-25 MED ORDER — INSULIN LISPRO (1 UNIT DIAL) 100 UNIT/ML (KWIKPEN): PEN_INJECTOR | SUBCUTANEOUS | 4 refills | Status: DC

## 2022-10-13 ENCOUNTER — Other Ambulatory Visit: Payer: Self-pay | Admitting: Family

## 2022-10-13 DIAGNOSIS — E039 Hypothyroidism, unspecified: Secondary | ICD-10-CM

## 2022-10-15 MED ORDER — LEVOTHYROXINE SODIUM 100 MCG PO TABS: 100.0000 ug | ORAL_TABLET | Freq: Every day | ORAL | 0 refills | Status: DC

## 2022-10-15 MED ORDER — AMITRIPTYLINE HCL 25 MG PO TABS: 25.0000 mg | ORAL_TABLET | Freq: Every day | ORAL | 0 refills | Status: DC

## 2022-10-15 MED ORDER — ATORVASTATIN CALCIUM 20 MG PO TABS: 20.0000 mg | ORAL_TABLET | Freq: Every day | ORAL | 0 refills | Status: DC

## 2022-10-28 ENCOUNTER — Encounter: Payer: Self-pay | Admitting: Family

## 2022-10-28 MED ORDER — ESCITALOPRAM OXALATE 10 MG PO TABS: 15.0000 mg | ORAL_TABLET | Freq: Every day | ORAL | 1 refills | Status: DC

## 2022-11-07 ENCOUNTER — Other Ambulatory Visit: Payer: Self-pay | Admitting: Family

## 2022-11-07 DIAGNOSIS — E119 Type 2 diabetes mellitus without complications: Secondary | ICD-10-CM

## 2022-11-29 ENCOUNTER — Encounter: Payer: Self-pay | Admitting: Family

## 2022-11-29 DIAGNOSIS — Z23 Encounter for immunization: Secondary | ICD-10-CM | POA: Diagnosis not present

## 2022-12-08 NOTE — Telephone Encounter (Signed)
[facility] listed in care. Will send letter for results.

## 2022-12-12 ENCOUNTER — Other Ambulatory Visit: Payer: Self-pay | Admitting: Family

## 2022-12-12 DIAGNOSIS — E039 Hypothyroidism, unspecified: Secondary | ICD-10-CM

## 2022-12-12 MED ORDER — LEVOTHYROXINE SODIUM 100 MCG PO TABS: 100.0000 ug | ORAL_TABLET | Freq: Every day | ORAL | 0 refills | Status: DC

## 2022-12-21 ENCOUNTER — Encounter: Payer: Self-pay | Admitting: Family

## 2022-12-21 NOTE — Telephone Encounter (Signed)
 Care team updated and letter sent for eye exam notes.

## 2023-01-12 ENCOUNTER — Encounter: Payer: Self-pay | Admitting: Family

## 2023-01-12 DIAGNOSIS — E039 Hypothyroidism, unspecified: Secondary | ICD-10-CM

## 2023-01-12 DIAGNOSIS — E119 Type 2 diabetes mellitus without complications: Secondary | ICD-10-CM

## 2023-01-14 MED ORDER — PIOGLITAZONE HCL 30 MG PO TABS: 30.0000 mg | ORAL_TABLET | Freq: Every day | ORAL | 0 refills | Status: DC

## 2023-01-14 MED ORDER — AMITRIPTYLINE HCL 25 MG PO TABS: 25.0000 mg | ORAL_TABLET | Freq: Every day | ORAL | 0 refills | Status: DC

## 2023-01-14 MED ORDER — SITAGLIPTIN PHOSPHATE 100 MG PO TABS: 100.0000 mg | ORAL_TABLET | Freq: Every day | ORAL | 0 refills | Status: DC

## 2023-01-14 MED ORDER — LEVOTHYROXINE SODIUM 100 MCG PO TABS: 100.0000 ug | ORAL_TABLET | Freq: Every day | ORAL | 0 refills | Status: DC

## 2023-01-14 MED ORDER — ATORVASTATIN CALCIUM 20 MG PO TABS: 20.0000 mg | ORAL_TABLET | Freq: Every day | ORAL | 0 refills | Status: DC

## 2023-01-14 MED ORDER — EMPAGLIFLOZIN 10 MG PO TABS
10.0000 mg | ORAL_TABLET | Freq: Every day | ORAL | 0 refills | Status: DC
Start: 2023-01-14 — End: 2023-05-08

## 2023-01-14 MED ORDER — INSULIN LISPRO (1 UNIT DIAL) 100 UNIT/ML (KWIKPEN): PEN_INJECTOR | SUBCUTANEOUS | 3 refills | Status: AC

## 2023-01-14 MED ORDER — ESCITALOPRAM OXALATE 10 MG PO TABS: 15.0000 mg | ORAL_TABLET | Freq: Every day | ORAL | 0 refills | Status: DC

## 2023-03-10 ENCOUNTER — Encounter: Payer: Self-pay | Admitting: Family

## 2023-03-11 ENCOUNTER — Other Ambulatory Visit: Payer: Self-pay | Admitting: *Deleted

## 2023-03-11 MED ORDER — LANTUS SOLOSTAR 100 UNIT/ML ~~LOC~~ SOPN: 13.0000 [IU] | PEN_INJECTOR | Freq: Every day | SUBCUTANEOUS | 1 refills | Status: DC

## 2023-04-24 ENCOUNTER — Other Ambulatory Visit (HOSPITAL_COMMUNITY): Payer: Self-pay

## 2023-04-25 ENCOUNTER — Other Ambulatory Visit: Payer: Self-pay | Admitting: Family

## 2023-04-26 ENCOUNTER — Other Ambulatory Visit (HOSPITAL_COMMUNITY): Payer: Self-pay

## 2023-04-26 ENCOUNTER — Telehealth: Payer: Self-pay

## 2023-04-26 MED ORDER — ESCITALOPRAM OXALATE 10 MG PO TABS: 15.0000 mg | ORAL_TABLET | Freq: Every day | ORAL | 0 refills | Status: DC

## 2023-04-26 MED ORDER — ATORVASTATIN CALCIUM 20 MG PO TABS: 20.0000 mg | ORAL_TABLET | Freq: Every day | ORAL | 0 refills | Status: DC

## 2023-04-26 NOTE — Telephone Encounter (Signed)
 Pharmacy Patient Advocate Encounter   Received notification from CoverMyMeds that prior authorization for Pioglitazone HCl 30MG  tablets is due for renewal.   Insurance verification completed.   The patient is insured through [employer].  Action: PA required and submitted KEY/EOC/Request #: [id number] CANCELLED due to: The member recently filled this medication and will be able to return for their next refill according to their plan limits.

## 2023-05-08 ENCOUNTER — Other Ambulatory Visit: Payer: Self-pay | Admitting: Family

## 2023-05-08 DIAGNOSIS — E119 Type 2 diabetes mellitus without complications: Secondary | ICD-10-CM

## 2023-05-10 ENCOUNTER — Telehealth: Payer: Self-pay | Admitting: Family

## 2023-05-10 ENCOUNTER — Encounter: Payer: Self-pay | Admitting: Family

## 2023-05-10 ENCOUNTER — Ambulatory Visit: Admitting: Family

## 2023-05-10 VITALS — BP 122/74 | HR 87 | Temp 98.5°F | Resp 16 | Ht 63.0 in | Wt 136.0 lb

## 2023-05-10 DIAGNOSIS — R809 Proteinuria, unspecified: Secondary | ICD-10-CM

## 2023-05-10 DIAGNOSIS — E039 Hypothyroidism, unspecified: Secondary | ICD-10-CM | POA: Diagnosis not present

## 2023-05-10 DIAGNOSIS — E1165 Type 2 diabetes mellitus with hyperglycemia: Secondary | ICD-10-CM

## 2023-05-10 DIAGNOSIS — E785 Hyperlipidemia, unspecified: Secondary | ICD-10-CM

## 2023-05-10 DIAGNOSIS — F4320 Adjustment disorder, unspecified: Secondary | ICD-10-CM

## 2023-05-10 MED ORDER — FREESTYLE LIBRE 3 SENSOR MISC: 11 refills | Status: DC
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
## 2023-05-10 NOTE — Assessment & Plan Note (Signed)
 Had hyperkalemia with ACE.  Update urine microalbumin.

## 2023-05-10 NOTE — Telephone Encounter (Signed)
 Electronic request made
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
## 2023-05-10 NOTE — Assessment & Plan Note (Signed)
Clinically stable on synthroid.  

## 2023-05-10 NOTE — Assessment & Plan Note (Signed)
  Blood glucose levels fluctuate, averaging 120 mg/dL, occasionally reaching 220-230 mg/dL. Discussed CGM for improved management. - Order A1c test. - Prescribe continuous glucose monitor. - Monitor blood glucose levels regularly. - Continue lantus  and jardiance .

## 2023-05-10 NOTE — Telephone Encounter (Signed)
 Please request DM eye exam from [facility] on [address].

## 2023-05-10 NOTE — Assessment & Plan Note (Signed)
 Lab Results  Component Value Date   CHOL 168 09/15/2021   HDL 45.10 09/15/2021   LDLCALC 102 (H) 09/15/2021   LDLDIRECT 106.0 04/15/2020   TRIG 105.0 09/15/2021   CHOLHDL 4 09/15/2021   Maintained on lipitor. Update lipid panel.

## 2023-05-10 NOTE — Assessment & Plan Note (Signed)
 She feels like she is starting to improve. Declines counseling at this time. Monitor.

## 2023-05-11 LAB — MICROALBUMIN / CREATININE URINE RATIO
Creatinine, Urine: 10 mg/dL — ABNORMAL LOW (ref 20–275)
Microalb, Ur: <0.2 mg/dL

## 2023-05-11 LAB — LIPID PANEL
Cholesterol: 185 mg/dL (ref <200)
HDL: 37 mg/dL — ABNORMAL LOW (ref >=50)
Non-HDL Cholesterol (Calc): 148 mg/dL — ABNORMAL HIGH (ref <130)
Total CHOL/HDL Ratio: 5 (calc) — ABNORMAL HIGH (ref <5.0)
Triglycerides: 593 mg/dL — ABNORMAL HIGH (ref <150)

## 2023-05-11 LAB — HEMOGLOBIN A1C
Hgb A1c MFr Bld: 9 % — ABNORMAL HIGH (ref <5.7)
Mean Plasma Glucose: 212 mg/dL
eAG (mmol/L): 11.7 mmol/L

## 2023-05-11 LAB — TSH: TSH: 0.2 m[IU]/L — ABNORMAL LOW (ref 0.40–4.50)

## 2023-05-13 ENCOUNTER — Other Ambulatory Visit: Payer: Self-pay | Admitting: Family

## 2023-05-13 DIAGNOSIS — E119 Type 2 diabetes mellitus without complications: Secondary | ICD-10-CM

## 2023-05-14 ENCOUNTER — Encounter: Payer: Self-pay | Admitting: Family

## 2023-05-15 ENCOUNTER — Other Ambulatory Visit: Payer: Self-pay | Admitting: *Deleted

## 2023-05-15 MED ORDER — AMITRIPTYLINE HCL 25 MG PO TABS: 25.0000 mg | ORAL_TABLET | Freq: Every day | ORAL | 0 refills | Status: DC

## 2023-05-15 MED ORDER — AMITRIPTYLINE HCL 25 MG PO TABS: 25.0000 mg | ORAL_TABLET | Freq: Every day | ORAL | 1 refills | Status: DC

## 2023-05-16 ENCOUNTER — Encounter: Payer: Self-pay | Admitting: Family

## 2023-05-16 ENCOUNTER — Telehealth: Payer: Self-pay | Admitting: Family

## 2023-05-16 MED ORDER — LANTUS SOLOSTAR 100 UNIT/ML ~~LOC~~ SOPN: 10.0000 [IU] | PEN_INJECTOR | Freq: Every day | SUBCUTANEOUS | Status: DC

## 2023-05-16 NOTE — Telephone Encounter (Signed)
 See mychart.

## 2023-05-23 IMAGING — CT CT ABD-PELV W/ CM
2 of 5 series · 15 of 46 positions shown, 17 images · IV contrast (Omnipaque)
Comparison: Abdominopelvic CT 07/29/2015. abdominal MRI 03/29/2018

CLINICAL DATA: Left upper quadrant tender mass.  Ventral hernia.

EXAM:
CT ABDOMEN AND PELVIS WITH CONTRAST
TECHNIQUE: Multidetector CT imaging of the abdomen and pelvis was performed
using the standard protocol following bolus administration of
intravenous contrast.

[Series 2: axial st · axial · 0.73mm/px · z∈[+788,+1193]mm · 12 of 91 slices shown, 14 images]
[im 5/91  soft-tissue]
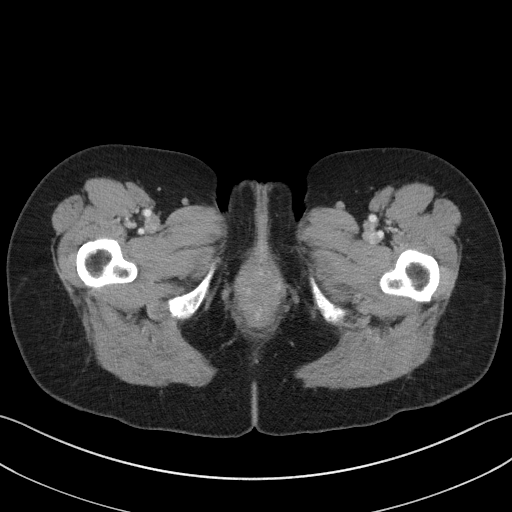
[im 5/91  bone]
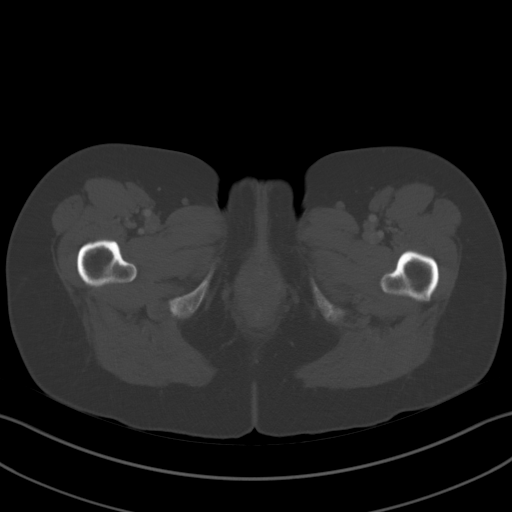
[im 14/91  soft-tissue]
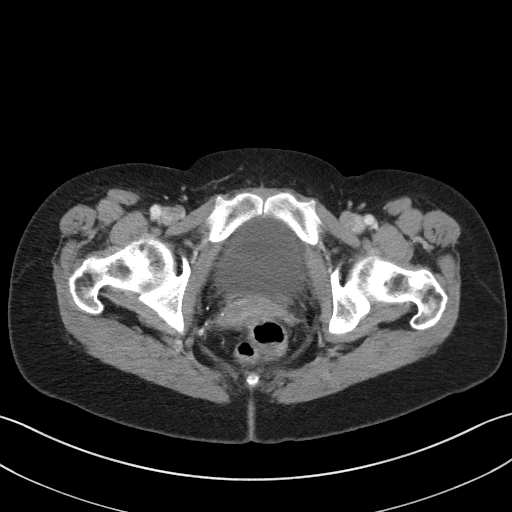
[im 19/91  soft-tissue]
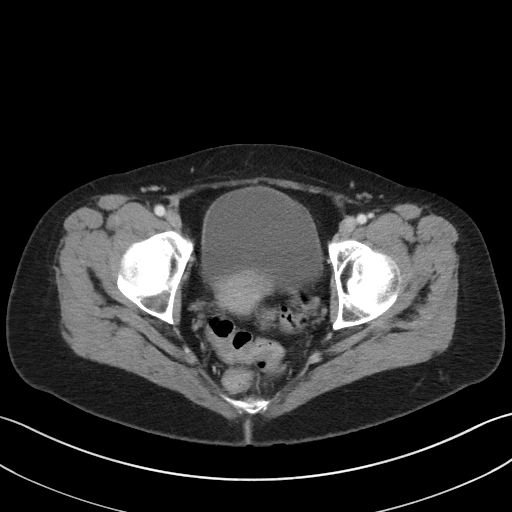
[im 28/91  soft-tissue]
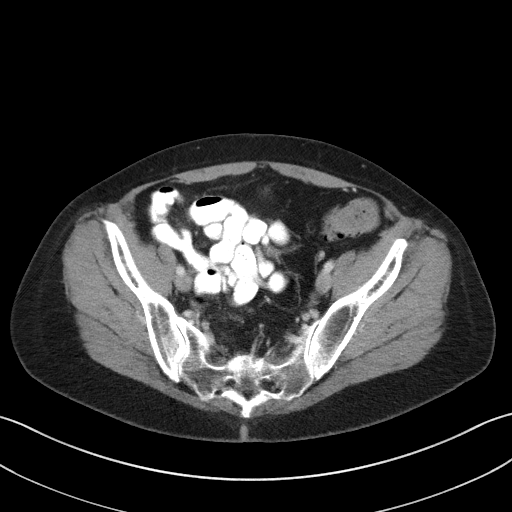
[im 37/91  soft-tissue]
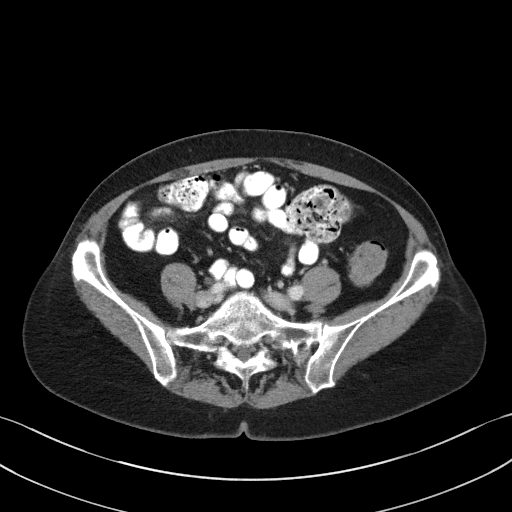
[im 41/91  soft-tissue]
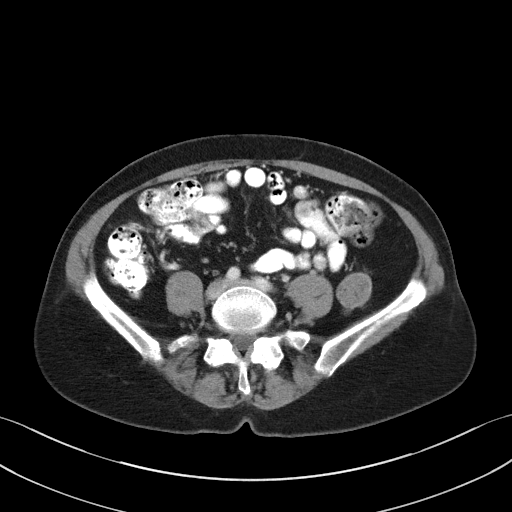
[im 50/91  soft-tissue]
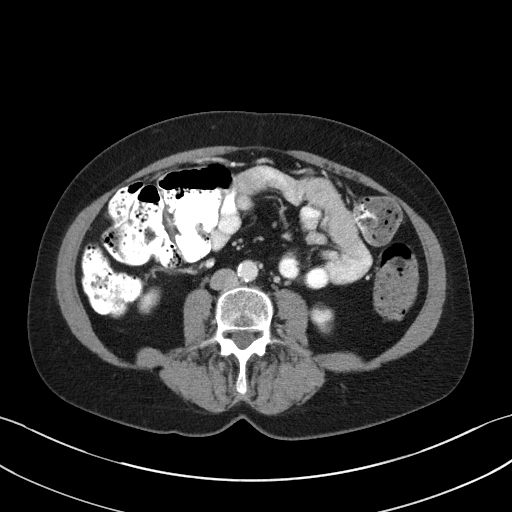
[im 55/91  soft-tissue]
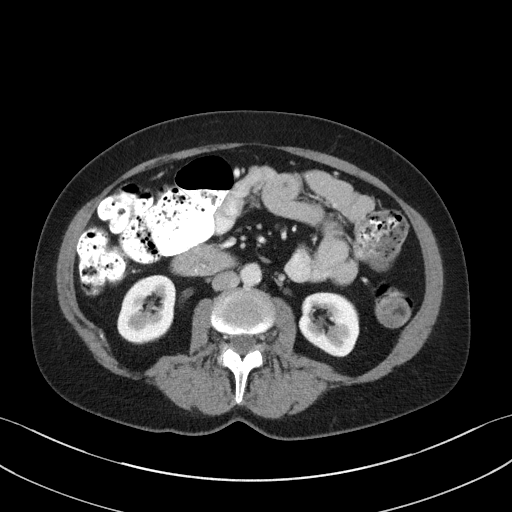
[im 64/91  soft-tissue]
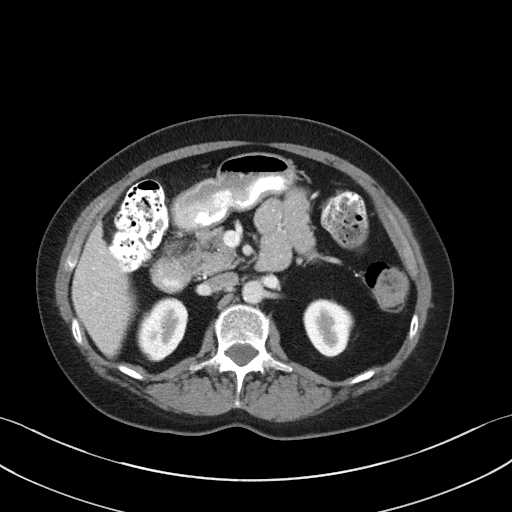
[im 64/91  bone]
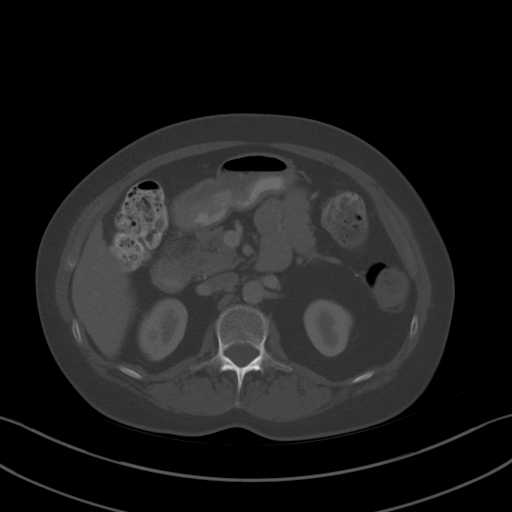
[im 73/91  soft-tissue]
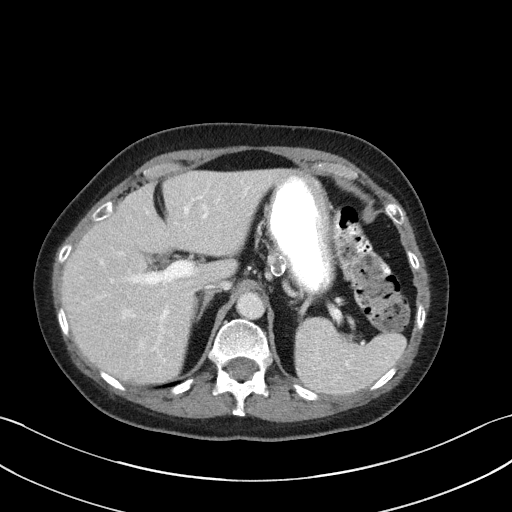
[im 77/91  soft-tissue]
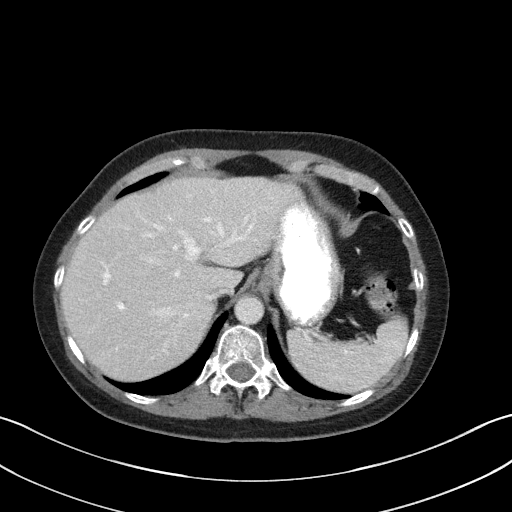
[im 86/91  soft-tissue]
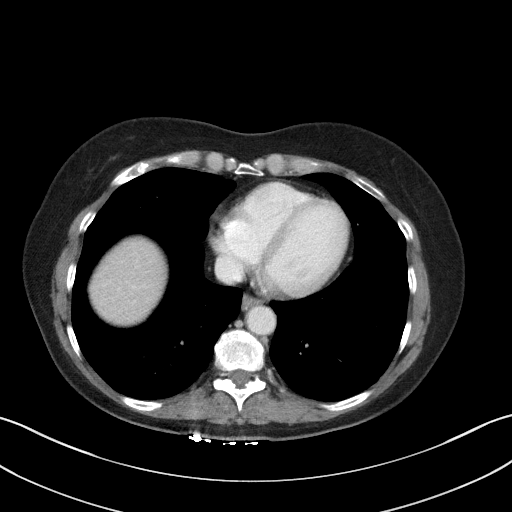

[Series 5: coronal st · coronal · 0.73mm/px · 3 of 78 slices shown]
[im 26/78  soft-tissue]
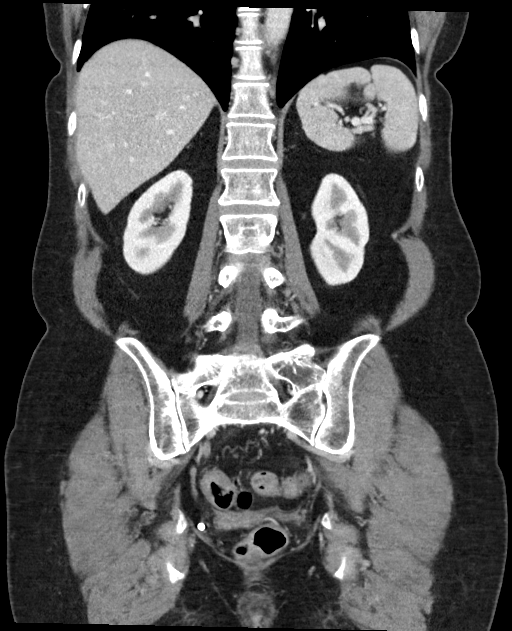
[im 35/78  soft-tissue]
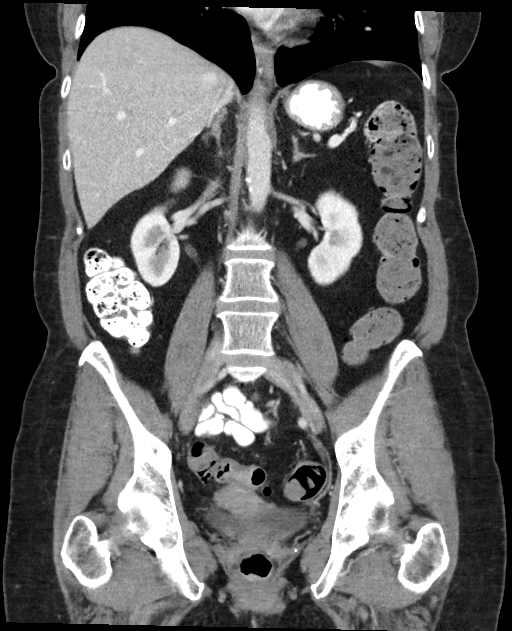
[im 43/78  soft-tissue]
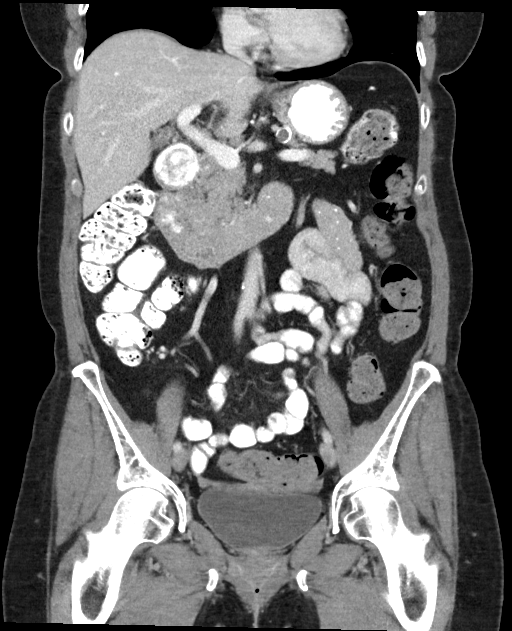

[15 of 46 positions shown; findings below may reference images not displayed]

RADIATION DOSE REDUCTION: This exam was performed according to the
departmental dose-optimization program which includes automated
exposure control, adjustment of the mA and/or kV according to
patient size and/or use of iterative reconstruction technique.

CONTRAST:  100mL OMNIPAQUE IOHEXOL 300 MG/ML  SOLN
FINDINGS: Lower chest: Clear lung bases. Minor subsegmental scarring in the
right middle lobe. No pleural effusion.

Hepatobiliary: Normal size liver without focal liver lesion.
Borderline hepatic steatosis. Physiologically distended gallbladder
with Phrygian cap. No calcified gallstone. Normal caliber common
bile duct, no intra or extrahepatic biliary ductal dilatation.

Pancreas: No ductal dilatation or inflammation.  No pancreatic mass.

Spleen: Normal in size without focal abnormality.

Adrenals/Urinary Tract: Normal adrenal glands. No hydronephrosis or
perinephric edema. Homogeneous renal enhancement with symmetric
excretion on delayed phase imaging. No renal calculi or focal renal
lesion. Urinary bladder is physiologically distended without wall
thickening.

Stomach/Bowel: The stomach is physiologically distended. Normal
positioning of the duodenum and ligament of Treitz. Normal small
bowel without obstruction, wall thickening, or inflammatory change.
Prior appendectomy. High-riding cecum in the right mid abdomen
moderate colonic stool burden. There is mild colonic redundancy. No
colonic wall thickening or colonic inflammation. Occasional
descending and sigmoid diverticula without diverticulitis. No
visualized colonic mass.

Vascular/Lymphatic: Mild aortic atherosclerosis. No aortic aneurysm.
Patent portal, splenic, and mesenteric veins. There is a thrombosed
16 mm proximal splenic artery aneurysm. No enlarged lymph nodes in
the abdomen or pelvis.

Reproductive: Normal quiescent appearance of the uterus. There is no
adnexal mass.

Other: There is no abdominal wall hernia. Particularly, no upper
abdominal ventral abdominal hernia. No ascites. No omental
thickening. No subcutaneous abnormality in the left upper quadrant.

Musculoskeletal: There are no acute or suspicious osseous
abnormalities. Transitional lumbosacral anatomy.
IMPRESSION: 1. No acute abnormality in the abdomen/pelvis. No abdominal wall
hernia or explanation for left upper quadrant pain. No evidence of
abdominal mass.
2. Mild colonic diverticulosis without diverticulitis.
3. Thrombosed 16 mm proximal splenic artery aneurysm.
4. Borderline hepatic steatosis.

Aortic Atherosclerosis ([id number]-KVX.X).

## 2023-05-29 ENCOUNTER — Other Ambulatory Visit: Payer: Self-pay | Admitting: Family

## 2023-05-30 MED ORDER — ATORVASTATIN CALCIUM 20 MG PO TABS
20.0000 mg | ORAL_TABLET | Freq: Every day | ORAL | 0 refills | Status: DC
Start: 2023-05-30 — End: 2023-06-24

## 2023-06-03 ENCOUNTER — Telehealth: Payer: Self-pay

## 2023-06-03 ENCOUNTER — Other Ambulatory Visit (HOSPITAL_COMMUNITY): Payer: Self-pay

## 2023-06-03 NOTE — Telephone Encounter (Signed)
 Pharmacy Patient Advocate Encounter  Received notification from [facility] that Prior Authorization for FreeStyle Libre 3 Sensor has been APPROVED from 5.19.25 to 5.19.26. Ran test claim, Copay is $39.99. This test claim was processed through [facility]- copay amounts may vary at other pharmacies due to pharmacy/plan contracts, or as the patient moves through the different stages of their insurance plan.   PA #/Case ID/Reference #:  (Key: [id number])

## 2023-06-24 ENCOUNTER — Other Ambulatory Visit: Payer: Self-pay | Admitting: Family

## 2023-07-22 LAB — HM DIABETES EYE EXAM

## 2023-07-31 ENCOUNTER — Encounter: Payer: Self-pay | Admitting: Family

## 2023-08-02 ENCOUNTER — Encounter: Payer: Self-pay | Admitting: Family

## 2023-08-02 DIAGNOSIS — J019 Acute sinusitis, unspecified: Secondary | ICD-10-CM

## 2023-08-02 MED ORDER — AMOXICILLIN-POT CLAVULANATE 875-125 MG PO TABS: 1.0000 | ORAL_TABLET | Freq: Two times a day (BID) | ORAL | 0 refills | Status: DC

## 2023-08-02 NOTE — Telephone Encounter (Signed)

## 2023-08-04 ENCOUNTER — Other Ambulatory Visit: Payer: Self-pay | Admitting: Family

## 2023-08-04 DIAGNOSIS — E119 Type 2 diabetes mellitus without complications: Secondary | ICD-10-CM

## 2023-08-05 ENCOUNTER — Encounter: Payer: Self-pay | Admitting: Family

## 2023-08-05 NOTE — Telephone Encounter (Signed)
 Ok per Dr Watt, letter sent to her MyChart

## 2023-08-09 ENCOUNTER — Ambulatory Visit: Admitting: Family

## 2023-08-09 VITALS — BP 118/69 | HR 82 | Temp 98.2°F | Ht 63.0 in | Wt 132.0 lb

## 2023-08-09 DIAGNOSIS — R809 Proteinuria, unspecified: Secondary | ICD-10-CM

## 2023-08-09 DIAGNOSIS — J209 Acute bronchitis, unspecified: Secondary | ICD-10-CM

## 2023-08-09 DIAGNOSIS — E1165 Type 2 diabetes mellitus with hyperglycemia: Secondary | ICD-10-CM

## 2023-08-09 DIAGNOSIS — Z794 Long term (current) use of insulin: Secondary | ICD-10-CM | POA: Diagnosis not present

## 2023-08-09 DIAGNOSIS — Z0184 Encounter for antibody response examination: Secondary | ICD-10-CM

## 2023-08-09 DIAGNOSIS — E039 Hypothyroidism, unspecified: Secondary | ICD-10-CM

## 2023-08-09 DIAGNOSIS — F4320 Adjustment disorder, unspecified: Secondary | ICD-10-CM

## 2023-08-09 DIAGNOSIS — F419 Anxiety disorder, unspecified: Secondary | ICD-10-CM

## 2023-08-09 DIAGNOSIS — G2581 Restless legs syndrome: Secondary | ICD-10-CM

## 2023-08-09 DIAGNOSIS — Z7984 Long term (current) use of oral hypoglycemic drugs: Secondary | ICD-10-CM

## 2023-08-09 DIAGNOSIS — E785 Hyperlipidemia, unspecified: Secondary | ICD-10-CM

## 2023-08-09 DIAGNOSIS — Z23 Encounter for immunization: Secondary | ICD-10-CM

## 2023-08-09 DIAGNOSIS — G4700 Insomnia, unspecified: Secondary | ICD-10-CM

## 2023-08-09 MED ORDER — ALBUTEROL SULFATE HFA 108 (90 BASE) MCG/ACT IN AERS: 2.0000 | INHALATION_SPRAY | Freq: Four times a day (QID) | RESPIRATORY_TRACT | 0 refills | Status: DC | PRN

## 2023-08-09 MED ORDER — AZITHROMYCIN 250 MG PO TABS: ORAL_TABLET | ORAL | 0 refills | Status: AC

## 2023-08-09 NOTE — Assessment & Plan Note (Signed)
 Lab Results  Component Value Date   CHOL 185 05/10/2023   HDL 37 (L) 05/10/2023   LDLCALC  05/10/2023     Comment:     . LDL cholesterol not calculated. Triglyceride levels greater than 400 mg/dL invalidate calculated LDL results. . Reference range: <100 . Desirable range <100 mg/dL for primary prevention;   <70 mg/dL for patients with CHD or diabetic patients  with > or = 2 CHD risk factors. SABRA LDL-C is now calculated using the Martin-Hopkins  calculation, which is a validated novel method providing  better accuracy than the Friedewald equation in the  estimation of LDL-C.  Gladis APPLETHWAITE et al. SANDREA. 7986;689(80): 2061-2068  (http://education.[facility].com/faq/FAQ164)    LDLDIRECT 106.0 04/15/2020   TRIG 593 (H) 05/10/2023   CHOLHDL 5.0 (H) 05/10/2023   Maintained on lipitor and fish oil.

## 2023-08-09 NOTE — Assessment & Plan Note (Signed)
 Had hyperkalemia with ACE. Continue jardiance .

## 2023-08-09 NOTE — Assessment & Plan Note (Addendum)
 Lab Results  Component Value Date   HGBA1C 9.0 (H) 05/10/2023   HGBA1C 8.2 (H) 05/22/2022   HGBA1C 7.4 (H) 09/15/2021   Lab Results  Component Value Date   MICROALBUR <0.2 05/10/2023   LDLCALC  05/10/2023     Comment:     . LDL cholesterol not calculated. Triglyceride levels greater than 400 mg/dL invalidate calculated LDL results. . Reference range: <100 . Desirable range <100 mg/dL for primary prevention;   <70 mg/dL for patients with CHD or diabetic patients  with > or = 2 CHD risk factors. SABRA LDL-C is now calculated using the Martin-Hopkins  calculation, which is a validated novel method providing  better accuracy than the Friedewald equation in the  estimation of LDL-C.  Gladis APPLETHWAITE et al. SANDREA. 7986;689(80): 2061-2068  (http://education.[facility].com/faq/FAQ164)    CREATININE 0.76 05/22/2022   She is doing much better with her sugar control now that she has the [employer] continuous meter. Continue lantus  8-13 units, has not needed meal coverage recently, continue actos , januvia  and jardiance .

## 2023-08-09 NOTE — Assessment & Plan Note (Addendum)
 Lab Results  Component Value Date   TSH 0.20 (L) 05/10/2023   Last TSH was a little low. Will update again today. If still a low will plan to adjust tsh.

## 2023-08-09 NOTE — Assessment & Plan Note (Signed)
  Persistent cough for two weeks with mild wheezing. Augmentin  intolerance noted. Prescribed Z-Pak for potential bronchitis and inhaler for wheezing. - Prescribe Z-Pak (azithromycin ) for potential bronchitis. - Provide inhaler for wheezing. - Consider inhaled steroid if no improvement after 3-4 days of inhaler use.

## 2023-08-09 NOTE — Assessment & Plan Note (Signed)
 Doing much better. Monitor.

## 2023-08-09 NOTE — Progress Notes (Signed)
 Subjective:     Patient ID: Donna Anderson, female    DOB: 02-24-1960, 63 y.o.   MRN: [id number]  Chief Complaint  Patient presents with   Diabetes    Here for follow     Diabetes    Discussed the use of AI scribe software for clinical note transcription with the patient, who gave verbal consent to proceed.  History of Present Illness Donna Anderson is a 63 year old female with diabetes who presents for a regular three-month follow-up.  Her blood sugar levels fluctuate between 90 and 170 mg/dL. She uses a [employer] for glucose monitoring and has made dietary changes, reducing sweets, pasta, potatoes, rice, and bread. Occasionally, her blood sugar drops to 68-69 mg/dL at night, requiring food intake to raise it. She administers Lantus  insulin , adjusting the dose between 8 to 13 units based on glucose levels, and uses Humalog  insulin  as needed.  She experiences intermittent restless leg syndrome, managed with over-the-counter medication. She previously used Requip  but now opts for more affordable options.  Headaches persisted for two weeks but have resolved. Augmentin  caused severe diarrhea, leading her to discontinue it and use Imodium.  She takes amitriptyline  at bedtime and sleeps well. Her mood is stable on Lexapro , taking one and a half of the 10 mg dose, which has been beneficial in coping with her sister's passing and her husband's health issues.  She takes Lipitor and has started omega-3 fish oil supplements to manage high triglycerides.  She is cautious about using narcotics due to a family history of addiction, preferring to manage pain with Tylenol .     Health Maintenance Due  Topic Date Due   Hepatitis B Vaccines (3 of 3 - 19+ 3-dose series) 03/22/2021   OPHTHALMOLOGY EXAM  08/26/2022   Diabetic kidney evaluation - eGFR measurement  05/22/2023   FOOT EXAM  05/22/2023    Past Medical History:  Diagnosis Date   Allergy    Anemia    Anxiety    Aortic  atherosclerosis (HCC)    Arthritis    Depression    Diabetes type 2, controlled (HCC) 06/21/2016   Diverticulosis 2015   CT SCAN    GERD (gastroesophageal reflux disease)    Graves disease 1997   Hearing loss    Hepatic steatosis    History of chicken pox    History of migraine    Hyperlipidemia    Hypertriglyceridemia 06/21/2016   Internal hemorrhoids    Kidney stones    Osteopenia 07/08/2016   Osteoporosis    osteopenia   Tubular adenoma of colon     Past Surgical History:  Procedure Laterality Date   APPENDECTOMY  2001   COLONOSCOPY     ELBOW SURGERY Right 2016   ENDOMETRIAL ABLATION  2003   HIP FRACTURE SURGERY Right 2009   fell playing with dog   labrum repair Right 2011   NASAL SINUS SURGERY     1998   OOPHORECTOMY Right 2001   due to large ovarian cyst   ovarian cysts     removed x 4 before the oophorectomy   POLYPECTOMY     THYROIDECTOMY  1997    Family History  Problem Relation Age of Onset   Hyperlipidemia Father    Hypertension Father    Heart failure Father 55   Diabetes Sister    Heart attack Sister 25   Osteoporosis Sister    Arthritis Paternal Grandmother    Kidney disease Paternal Grandmother  Diabetes Paternal Grandmother    Arthritis Paternal Grandfather    Hepatitis C Son    Arthritis Son    Drug abuse Son    Colon cancer Neg Hx    Colon polyps Neg Hx    Esophageal cancer Neg Hx    Rectal cancer Neg Hx    Stomach cancer Neg Hx    Liver disease Neg Hx    Inflammatory bowel disease Neg Hx     Social History   Socioeconomic History   Marital status: Married    Spouse name: Not on file   Number of children: Not on file   Years of education: Not on file   Highest education level: 12th grade  Occupational History   Not on file  Tobacco Use   Smoking status: Former    Current packs/day: 0.00    Average packs/day: 0.3 packs/day for 15.0 years (3.8 ttl pk-yrs)    Types: Cigarettes    Start date: 01/16/1984    Quit date: 01/16/1999     Years since quitting: 24.5   Smokeless tobacco: Never  Vaping Use   Vaping status: Never Used  Substance and Sexual Activity   Alcohol use: Not Currently    Alcohol/week: 0.0 standard drinks of alcohol    Comment: none in 2 years as of 05/07/18   Drug use: No   Sexual activity: Yes    Birth control/protection: None  Other Topics Concern   Not on file  Social History Narrative   Married (second marriage) has one son in [place]   Worked in Set designer   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    Completed 12th grade   1 dog, 1 cat   Was raised by her grandmother.    Social Drivers of Corporate investment banker Strain: Low Risk  (05/04/2023)   Overall Financial Resource Strain (CARDIA)    Difficulty of Paying Living Expenses: Not hard at all  Food Insecurity: No Food Insecurity (05/04/2023)   Hunger Vital Sign    Worried About Running Out of Food in the Last Year: Never true    Ran Out of Food in the Last Year: Never true  Transportation Needs: No Transportation Needs (05/04/2023)   PRAPARE - Administrator, Civil Service (Medical): No    Lack of Transportation (Non-Medical): No  Physical Activity: Unknown (05/04/2023)   Exercise Vital Sign    Days of Exercise per Week: 5 days    Minutes of Exercise per Session: Patient declined  Stress: No Stress Concern Present (05/04/2023)   [employer] of Occupational Health - Occupational Stress Questionnaire    Feeling of Stress : Not at all  Social Connections: Unknown (05/04/2023)   Social Connection and Isolation Panel    Frequency of Communication with Friends and Family: Patient declined    Frequency of Social Gatherings with Friends and Family: Patient declined    Attends Religious Services: Patient declined    Database administrator or Organizations: No    Attends Engineer, structural: Not on file    Marital Status: Married  Catering manager Violence:  Not on file    Outpatient Medications Prior to Visit  Medication Sig Dispense Refill   amitriptyline  (ELAVIL ) 25 MG tablet Take 1 tablet (25 mg total) by mouth at bedtime. 90 tablet 1   amoxicillin -clavulanate (AUGMENTIN ) 875-125 MG tablet Take 1 tablet by mouth 2 (two) times daily. 14 tablet 0  atorvastatin  (LIPITOR) 20 MG tablet Take 1 tablet by mouth once daily 90 tablet 0   Continuous Glucose Sensor (FREESTYLE LIBRE 3 SENSOR) MISC Place 1 sensor on the skin every 14 days. Use to check glucose continuously 2 each 11   escitalopram  (LEXAPRO ) 10 MG tablet Take 1.5 tablets (15 mg total) by mouth daily. 45 tablet 0   insulin  lispro (HUMALOG ) 100 UNIT/ML KwikPen Inject prior to dinner using sliding scale 0-12 units based on blood sugar reading 15 mL 3   JANUVIA  100 MG tablet Take 1 tablet by mouth once daily 90 tablet 0   JARDIANCE  10 MG TABS tablet Take 1 tablet by mouth once daily 90 tablet 0   LANTUS  SOLOSTAR 100 UNIT/ML Solostar Pen Inject 10 Units into the skin at bedtime.     levothyroxine  (SYNTHROID ) 100 MCG tablet Take 1 tablet (100 mcg total) by mouth daily before breakfast. 90 tablet 0   meclizine  (ANTIVERT ) 25 MG tablet Take 1 tablet (25 mg total) by mouth 3 (three) times daily as needed for dizziness. 30 tablet 0   pioglitazone  (ACTOS ) 30 MG tablet Take 1 tablet by mouth once daily 90 tablet 0   No facility-administered medications prior to visit.    Allergies  Allergen Reactions   Augmentin  [Amoxicillin -Pot Clavulanate] Diarrhea    diarrhea   Tramadol  Itching    ROS See HPI    Objective:    Physical Exam Constitutional:      General: She is not in acute distress.    Appearance: Normal appearance. She is well-developed.  HENT:     Head: Normocephalic and atraumatic.     Right Ear: External ear normal.     Left Ear: External ear normal.  Eyes:     General: No scleral icterus. Neck:     Thyroid : No thyromegaly.  Cardiovascular:     Rate and Rhythm: Normal rate  and regular rhythm.     Heart sounds: Normal heart sounds. No murmur heard. Pulmonary:     Effort: Pulmonary effort is normal. No respiratory distress.     Breath sounds: Wheezing present.  Musculoskeletal:     Cervical back: Neck supple.  Skin:    General: Skin is warm and dry.  Neurological:     Mental Status: She is alert and oriented to person, place, and time.  Psychiatric:        Mood and Affect: Mood normal.        Behavior: Behavior normal.        Thought Content: Thought content normal.        Judgment: Judgment normal.      BP 118/69 (BP Location: Right Arm, Patient Position: Sitting, Cuff Size: Normal)   Pulse 82   Temp 98.2 F (36.8 C) (Oral)   Ht 5' 3 (1.6 m)   Wt 132 lb (59.9 kg)   LMP 01/26/2001   SpO2 95%   BMI 23.38 kg/m  Wt Readings from Last 3 Encounters:  08/09/23 132 lb (59.9 kg)  05/10/23 136 lb (61.7 kg)  07/31/22 139 lb (63 kg)       Assessment & Plan:   Problem List Items Addressed This Visit       Unprioritized   Uncontrolled type 2 diabetes mellitus with hyperglycemia (HCC) - Primary   Lab Results  Component Value Date   HGBA1C 9.0 (H) 05/10/2023   HGBA1C 8.2 (H) 05/22/2022   HGBA1C 7.4 (H) 09/15/2021   Lab Results  Component Value Date   MICROALBUR <  0.2 05/10/2023   LDLCALC  05/10/2023     Comment:     . LDL cholesterol not calculated. Triglyceride levels greater than 400 mg/dL invalidate calculated LDL results. . Reference range: <100 . Desirable range <100 mg/dL for primary prevention;   <70 mg/dL for patients with CHD or diabetic patients  with > or = 2 CHD risk factors. SABRA LDL-C is now calculated using the Martin-Hopkins  calculation, which is a validated novel method providing  better accuracy than the Friedewald equation in the  estimation of LDL-C.  Gladis APPLETHWAITE et al. SANDREA. 7986;689(80): 2061-2068  (http://education.[facility].com/faq/FAQ164)    CREATININE 0.76 05/22/2022   She is doing much better with  her sugar control now that she has the [employer] continuous meter. Continue lantus  8-13 units, has not needed meal coverage recently, continue actos , januvia  and jardiance .      Relevant Orders   HgB A1c   Urine Microalbumin w/creat. ratio   Basic Metabolic Panel (BMET)   Restless leg syndrome   Ok with an otc supplement.       Microalbuminuria   Had hyperkalemia with ACE. Continue jardiance .       Insomnia   Stable with HS elavil . Continue same.      Hypothyroidism   Lab Results  Component Value Date   TSH 0.20 (L) 05/10/2023   Last TSH was a little low. Will update again today. If still a low will plan to adjust tsh.       Relevant Orders   TSH   Hyperlipidemia   Lab Results  Component Value Date   CHOL 185 05/10/2023   HDL 37 (L) 05/10/2023   LDLCALC  05/10/2023     Comment:     . LDL cholesterol not calculated. Triglyceride levels greater than 400 mg/dL invalidate calculated LDL results. . Reference range: <100 . Desirable range <100 mg/dL for primary prevention;   <70 mg/dL for patients with CHD or diabetic patients  with > or = 2 CHD risk factors. SABRA LDL-C is now calculated using the Martin-Hopkins  calculation, which is a validated novel method providing  better accuracy than the Friedewald equation in the  estimation of LDL-C.  Gladis APPLETHWAITE et al. SANDREA. 7986;689(80): 2061-2068  (http://education.[facility].com/faq/FAQ164)    LDLDIRECT 106.0 04/15/2020   TRIG 593 (H) 05/10/2023   CHOLHDL 5.0 (H) 05/10/2023   Maintained on lipitor and fish oil.        Relevant Orders   Lipid panel   Grief reaction   Doing much better. Monitor.       Bronchitis with bronchospasm    Persistent cough for two weeks with mild wheezing. Augmentin  intolerance noted. Prescribed Z-Pak for potential bronchitis and inhaler for wheezing. - Prescribe Z-Pak (azithromycin ) for potential bronchitis. - Provide inhaler for wheezing. - Consider inhaled steroid if no  improvement after 3-4 days of inhaler use.      Relevant Medications   azithromycin  (ZITHROMAX ) 250 MG tablet   albuterol  (VENTOLIN  HFA) 108 (90 Base) MCG/ACT inhaler   Anxiety   Reports stable mood on lexapro  15mg . Continue same.       Other Visit Diagnoses       Immunity status testing       Relevant Orders   Hepatitis B surface antibody,quantitative     Need for pneumococcal 20-valent conjugate vaccination       Relevant Orders   Pneumococcal conjugate vaccine 20-valent (Prevnar 20) (Completed)       I am having Preslee K. Leitch  start on azithromycin  and albuterol . I am also having her maintain her meclizine , insulin  lispro, levothyroxine , escitalopram , FreeStyle Libre 3 Sensor, Januvia , amitriptyline , Lantus  SoloStar, atorvastatin , amoxicillin -clavulanate, Jardiance , and pioglitazone .  Meds ordered this encounter  Medications   azithromycin  (ZITHROMAX ) 250 MG tablet    Sig: Take 2 tablets on day 1, then 1 tablet daily on days 2 through 5    Dispense:  6 tablet    Refill:  0    Supervising Provider:   DOMENICA BLACKBIRD A [[id number]]   albuterol  (VENTOLIN  HFA) 108 (90 Base) MCG/ACT inhaler    Sig: Inhale 2 puffs into the lungs every 6 (six) hours as needed for wheezing or shortness of breath.    Dispense:  8 g    Refill:  0    Supervising Provider:   DOMENICA BLACKBIRD A [[id number]]

## 2023-08-09 NOTE — Assessment & Plan Note (Signed)
 Reports stable mood on lexapro  15mg . Continue same.

## 2023-08-09 NOTE — Assessment & Plan Note (Signed)
 Ok with an otc supplement.

## 2023-08-09 NOTE — Patient Instructions (Signed)
 VISIT SUMMARY:  Today, you had a follow-up visit to manage your diabetes and other health concerns. We discussed your blood sugar levels, medication adjustments, and addressed your recent symptoms and overall health maintenance.  YOUR PLAN:  TYPE 2 DIABETES MELLITUS: Your blood sugar levels range from 90 to 170 mg/dL with occasional low levels at night. You are using Freestyle Libre for monitoring and have made dietary changes. -Continue taking Lantus  insulin , adjusting the dose between 8 to 13 units at bedtime based on your glucose levels. -Use Humalog  insulin  as needed for high blood sugar. -Keep using the [facility] for glucose monitoring. -Consider seeing a nutritionist for further diabetes management if you wish.  CHRONIC COUGH: You have had a persistent cough for two weeks with mild wheezing and intolerance to Augmentin . -Take the prescribed Z-Pak (azithromycin ) for potential bronchitis. -Use the inhaler provided for wheezing. -If there is no improvement after 3-4 days of using the inhaler, consider using an inhaled steroid.  HYPOTHYROIDISM: There may have been missed doses of your thyroid  medication, and previous labs showed low thyroid  levels. -We will monitor your thyroid  levels with current lab work. -Adjust your thyroid  medication if the lab results show persistent low levels.  HYPERLIPIDEMIA: You have high triglycerides, likely related to your blood sugar levels and hereditary factors. You are currently taking Lipitor and omega-3 fish oil. -Continue taking Lipitor and omega-3 fish oil. -We will monitor your lipid levels with current lab work.  DEPRESSION: Your mood is well-managed on Lexapro , especially during recent stressors. -Continue taking Lexapro  7.5 mg daily.  RESTLESS LEG SYNDROME: You have intermittent symptoms managed with over-the-counter medication. -Continue using over-the-counter medication for symptom management.  GENERAL HEALTH MAINTENANCE: You are due  for a pneumonia booster and possibly need to complete the hepatitis B vaccination series. Recent eye exam completed and mammogram concerns addressed. -Administer Prevnar 20 pneumonia booster. -Check your hepatitis B immunity and complete the vaccination series if needed. -Encourage regular mammograms despite concerns about radiation.  FOLLOW-UP: Routine follow-up and monitoring of chronic conditions and lab results. -We will perform A1c, cholesterol, thyroid , and kidney function tests. -Conduct a foot exam. -Collect a urine sample for analysis.

## 2023-08-09 NOTE — Assessment & Plan Note (Signed)
Stable with HS elavil. Continue same.

## 2023-08-10 LAB — HEMOGLOBIN A1C
Hgb A1c MFr Bld: 6.9 % — ABNORMAL HIGH (ref <5.7)
Mean Plasma Glucose: 151 mg/dL
eAG (mmol/L): 8.4 mmol/L

## 2023-08-10 LAB — LIPID PANEL
Cholesterol: 154 mg/dL (ref <200)
HDL: 44 mg/dL — ABNORMAL LOW (ref >=50)
LDL Cholesterol (Calc): 87 mg/dL
Non-HDL Cholesterol (Calc): 110 mg/dL (ref <130)
Total CHOL/HDL Ratio: 3.5 (calc) (ref <5.0)
Triglycerides: 132 mg/dL (ref <150)

## 2023-08-10 LAB — BASIC METABOLIC PANEL WITH GFR
BUN/Creatinine Ratio: 34 (calc) — ABNORMAL HIGH (ref 6–22)
BUN: 28 mg/dL — ABNORMAL HIGH (ref 7–25)
CO2: 28 mmol/L (ref 20–32)
Calcium: 9.2 mg/dL (ref 8.6–10.4)
Chloride: 103 mmol/L (ref 98–110)
Creat: 0.83 mg/dL (ref 0.50–1.05)
Glucose, Bld: 134 mg/dL — ABNORMAL HIGH (ref 65–99)
Potassium: 5.3 mmol/L (ref 3.5–5.3)
Sodium: 141 mmol/L (ref 135–146)
eGFR: 80 mL/min/1.73m2 (ref >=60)

## 2023-08-10 LAB — MICROALBUMIN / CREATININE URINE RATIO
Creatinine, Urine: 48 mg/dL (ref 20–275)
Microalb Creat Ratio: 4 mg/g{creat} (ref <30)
Microalb, Ur: 0.2 mg/dL

## 2023-08-10 LAB — HEPATITIS B SURFACE ANTIBODY, QUANTITATIVE: Hep B S AB Quant (Post): 15 m[IU]/mL (ref >=10)

## 2023-08-10 LAB — TSH: TSH: 0.03 m[IU]/L — ABNORMAL LOW (ref 0.40–4.50)

## 2023-08-12 ENCOUNTER — Ambulatory Visit: Payer: Self-pay | Admitting: Family

## 2023-08-12 DIAGNOSIS — E039 Hypothyroidism, unspecified: Secondary | ICD-10-CM

## 2023-08-12 MED ORDER — LEVOTHYROXINE SODIUM 75 MCG PO TABS: 75.0000 ug | ORAL_TABLET | Freq: Every day | ORAL | 0 refills | Status: DC

## 2023-08-12 NOTE — Telephone Encounter (Signed)
 A1C has come down nicely to 6.9.    Her synthroid  dose is too high.  Decrease synthroid  to 75 mcg and repeat tsh in 6 weeks.   Cholesterol and triglycerides are much better.  She does not have immunity to hep B. She can start Heplisav B series if she would like.

## 2023-08-21 ENCOUNTER — Encounter: Payer: Self-pay | Admitting: Family

## 2023-08-21 DIAGNOSIS — J209 Acute bronchitis, unspecified: Secondary | ICD-10-CM

## 2023-08-21 MED ORDER — PREDNISONE 10 MG PO TABS
ORAL_TABLET | ORAL | 0 refills | Status: DC
Start: 2023-08-21 — End: 2023-09-12

## 2023-08-21 NOTE — Telephone Encounter (Signed)
 Seen 08/09/2023

## 2023-09-08 ENCOUNTER — Encounter: Payer: Self-pay | Admitting: Family

## 2023-09-09 ENCOUNTER — Other Ambulatory Visit: Payer: Self-pay

## 2023-09-09 DIAGNOSIS — E119 Type 2 diabetes mellitus without complications: Secondary | ICD-10-CM

## 2023-09-09 MED ORDER — FREESTYLE LIBRE 3 PLUS SENSOR MISC: Status: AC

## 2023-09-09 NOTE — Telephone Encounter (Signed)
 Rx sent for [facility] sensor 3 plus

## 2023-09-12 ENCOUNTER — Other Ambulatory Visit: Payer: Self-pay | Admitting: Medical Genetics

## 2023-09-12 ENCOUNTER — Encounter: Payer: Self-pay | Admitting: Family

## 2023-09-12 DIAGNOSIS — R051 Acute cough: Secondary | ICD-10-CM

## 2023-09-12 DIAGNOSIS — J209 Acute bronchitis, unspecified: Secondary | ICD-10-CM

## 2023-09-12 MED ORDER — PREDNISONE 10 MG PO TABS: ORAL_TABLET | ORAL | 0 refills | Status: DC

## 2023-09-12 MED ORDER — FLUTICASONE-SALMETEROL 100-50 MCG/ACT IN AEPB: 1.0000 | INHALATION_SPRAY | Freq: Two times a day (BID) | RESPIRATORY_TRACT | 3 refills | Status: DC

## 2023-09-12 MED ORDER — PROMETHAZINE-DM 6.25-15 MG/5ML PO SYRP: 5.0000 mL | ORAL_SOLUTION | Freq: Four times a day (QID) | ORAL | 0 refills | Status: DC | PRN

## 2023-09-17 ENCOUNTER — Encounter: Payer: Self-pay | Admitting: Family

## 2023-09-18 ENCOUNTER — Other Ambulatory Visit: Payer: Self-pay | Admitting: Family

## 2023-09-19 ENCOUNTER — Other Ambulatory Visit: Payer: Self-pay | Admitting: Family

## 2023-09-22 ENCOUNTER — Encounter: Payer: Self-pay | Admitting: Family

## 2023-09-22 DIAGNOSIS — U071 COVID-19: Secondary | ICD-10-CM

## 2023-09-23 ENCOUNTER — Encounter: Payer: Self-pay | Admitting: Family

## 2023-09-23 MED ORDER — PAXLOVID (300/100) 20 X 150 MG & 10 X 100MG PO TBPK: 3.0000 | ORAL_TABLET | Freq: Two times a day (BID) | ORAL | 0 refills | Status: AC

## 2023-09-23 NOTE — Telephone Encounter (Signed)
 Spoke to pt- states that her symptoms started 3 days ago and she had a test that showed 2 lines (not the image that she sent me). Plan to begin paxlovid  and provided letter for work.

## 2023-09-24 ENCOUNTER — Encounter: Payer: Self-pay | Admitting: Family

## 2023-09-26 ENCOUNTER — Other Ambulatory Visit

## 2023-09-26 ENCOUNTER — Ambulatory Visit

## 2023-10-01 ENCOUNTER — Other Ambulatory Visit

## 2023-10-02 ENCOUNTER — Encounter: Payer: Self-pay | Admitting: Family

## 2023-10-02 ENCOUNTER — Other Ambulatory Visit (INDEPENDENT_AMBULATORY_CARE_PROVIDER_SITE_OTHER)

## 2023-10-02 DIAGNOSIS — E039 Hypothyroidism, unspecified: Secondary | ICD-10-CM | POA: Diagnosis not present

## 2023-10-03 ENCOUNTER — Ambulatory Visit: Payer: Self-pay | Admitting: Family

## 2023-10-03 LAB — TSH: TSH: 1.13 u[IU]/mL (ref 0.35–5.50)

## 2023-11-02 ENCOUNTER — Other Ambulatory Visit: Payer: Self-pay | Admitting: Family

## 2023-11-02 DIAGNOSIS — E119 Type 2 diabetes mellitus without complications: Secondary | ICD-10-CM

## 2023-11-06 ENCOUNTER — Other Ambulatory Visit: Payer: Self-pay | Admitting: Family

## 2023-11-06 DIAGNOSIS — E039 Hypothyroidism, unspecified: Secondary | ICD-10-CM

## 2023-11-08 ENCOUNTER — Other Ambulatory Visit: Payer: Self-pay | Admitting: Medical Genetics

## 2023-11-08 DIAGNOSIS — Z006 Encounter for examination for normal comparison and control in clinical research program: Secondary | ICD-10-CM

## 2023-11-09 ENCOUNTER — Other Ambulatory Visit: Payer: Self-pay | Admitting: Family

## 2023-11-11 ENCOUNTER — Other Ambulatory Visit: Payer: Self-pay

## 2023-11-13 ENCOUNTER — Encounter: Payer: Self-pay | Admitting: Family

## 2023-11-14 NOTE — Telephone Encounter (Signed)
 Please contact pt to schedule OV for cough.

## 2023-11-14 NOTE — Telephone Encounter (Signed)
 Patient scheduled to come in tomorrow afternoon

## 2023-11-15 ENCOUNTER — Ambulatory Visit: Payer: Self-pay | Admitting: Family

## 2023-11-15 ENCOUNTER — Ambulatory Visit (HOSPITAL_BASED_OUTPATIENT_CLINIC_OR_DEPARTMENT_OTHER)
Admission: RE | Admit: 2023-11-15 | Discharge: 2023-11-15 | Disposition: A | Discharge status: Home / Self Care | Source: Ambulatory Visit | Attending: Family | Admitting: Family

## 2023-11-15 ENCOUNTER — Other Ambulatory Visit (HOSPITAL_BASED_OUTPATIENT_CLINIC_OR_DEPARTMENT_OTHER): Payer: Self-pay

## 2023-11-15 ENCOUNTER — Ambulatory Visit: Admitting: Family

## 2023-11-15 VITALS — BP 113/72 | HR 91 | Temp 97.7°F | Resp 16 | Ht 63.0 in | Wt 132.0 lb

## 2023-11-15 DIAGNOSIS — R059 Cough, unspecified: Secondary | ICD-10-CM

## 2023-11-15 DIAGNOSIS — E119 Type 2 diabetes mellitus without complications: Secondary | ICD-10-CM | POA: Diagnosis not present

## 2023-11-15 DIAGNOSIS — Z23 Encounter for immunization: Secondary | ICD-10-CM

## 2023-11-15 DIAGNOSIS — M79674 Pain in right toe(s): Secondary | ICD-10-CM

## 2023-11-15 DIAGNOSIS — E039 Hypothyroidism, unspecified: Secondary | ICD-10-CM | POA: Diagnosis not present

## 2023-11-15 DIAGNOSIS — J209 Acute bronchitis, unspecified: Secondary | ICD-10-CM | POA: Diagnosis not present

## 2023-11-15 DIAGNOSIS — E1165 Type 2 diabetes mellitus with hyperglycemia: Secondary | ICD-10-CM

## 2023-11-15 MED ORDER — ALBUTEROL SULFATE HFA 108 (90 BASE) MCG/ACT IN AERS
2.0000 | INHALATION_SPRAY | Freq: Four times a day (QID) | RESPIRATORY_TRACT | 0 refills | Status: AC | PRN
  Filled 2023-11-15: qty 6.7, 25d supply, fill #0

## 2023-11-15 MED ORDER — PROMETHAZINE-DM 6.25-15 MG/5ML PO SYRP
5.0000 mL | ORAL_SOLUTION | Freq: Four times a day (QID) | ORAL | 0 refills | Status: AC | PRN
  Filled 2023-11-15: qty 118, 6d supply, fill #0

## 2023-11-15 MED ORDER — PREDNISONE 10 MG PO TABS
ORAL_TABLET | ORAL | 0 refills | Status: DC
  Filled 2023-11-15: qty 20, 8d supply, fill #0

## 2023-11-15 NOTE — Progress Notes (Signed)
 Subjective:     Patient ID: Donna Anderson, female    DOB: 04/27/1960, 63 y.o.   MRN: [id number]  Chief Complaint  Patient presents with   Cough    Patient complains of productive cough for about 8 days    Cough    Discussed the use of AI scribe software for clinical note transcription with the patient, who gave verbal consent to proceed.  History of Present Illness  Donna Anderson is a 63 year old female with diabetes who presents with a persistent cough.  She has experienced a persistent cough for three months, initially relieved by an inhaler but has since returned. She denies fever or heartburn but has a runny nose. Her cough worsened after a COVID-19 infection in September. She has not had a recent chest x-ray, although an order was placed on September 12, 2023.  She manages her diabetes with a [employer], maintaining an average blood sugar of 127 mg/dL. She experiences nocturnal hypoglycemia, with levels dropping to 53 mg/dL, which improves upon sitting up. She has adjusted her insulin  dose from 13 units to 10 units at bedtime to manage these lows.  She experiences a burning sensation and pain in her right big toe, particularly when bending it, and wears steel-toed shoes daily.     Health Maintenance Due  Topic Date Due   Hepatitis B Vaccines 19-59 Average Risk (3 of 3 - 19+ 3-dose series) 03/22/2021   Influenza Vaccine  08/16/2023   Mammogram  09/20/2023    Past Medical History:  Diagnosis Date   Allergy    Anemia    Anxiety    Aortic atherosclerosis    Arthritis    Depression    Diabetes type 2, controlled (HCC) 06/21/2016   Diverticulosis 2015   CT SCAN    GERD (gastroesophageal reflux disease)    Graves disease 1997   Hearing loss    Hepatic steatosis    History of chicken pox    History of migraine    Hyperlipidemia    Hypertriglyceridemia 06/21/2016   Internal hemorrhoids    Kidney stones    Osteopenia 07/08/2016   Osteoporosis    osteopenia    Tubular adenoma of colon     Past Surgical History:  Procedure Laterality Date   APPENDECTOMY  2001   COLONOSCOPY     ELBOW SURGERY Right 2016   ENDOMETRIAL ABLATION  2003   HIP FRACTURE SURGERY Right 2009   fell playing with dog   labrum repair Right 2011   NASAL SINUS SURGERY     1998   OOPHORECTOMY Right 2001   due to large ovarian cyst   ovarian cysts     removed x 4 before the oophorectomy   POLYPECTOMY     THYROIDECTOMY  1997    Family History  Problem Relation Age of Onset   Hyperlipidemia Father    Hypertension Father    Heart failure Father 22   Diabetes Sister    Heart attack Sister 82   Osteoporosis Sister    Arthritis Paternal Grandmother    Kidney disease Paternal Grandmother    Diabetes Paternal Grandmother    Arthritis Paternal Grandfather    Hepatitis C Son    Arthritis Son    Drug abuse Son    Colon cancer Neg Hx    Colon polyps Neg Hx    Esophageal cancer Neg Hx    Rectal cancer Neg Hx    Stomach cancer Neg Hx  Liver disease Neg Hx    Inflammatory bowel disease Neg Hx     Social History   Socioeconomic History   Marital status: Married    Spouse name: Not on file   Number of children: Not on file   Years of education: Not on file   Highest education level: 12th grade  Occupational History   Not on file  Tobacco Use   Smoking status: Former    Current packs/day: 0.00    Average packs/day: 0.3 packs/day for 15.0 years (3.8 ttl pk-yrs)    Types: Cigarettes    Start date: 01/16/1984    Quit date: 01/16/1999    Years since quitting: 24.8   Smokeless tobacco: Never  Vaping Use   Vaping status: Never Used  Substance and Sexual Activity   Alcohol use: Not Currently    Alcohol/week: 0.0 standard drinks of alcohol    Comment: none in 2 years as of 05/07/18   Drug use: No   Sexual activity: Yes    Birth control/protection: None  Other Topics Concern   Not on file  Social History Narrative   Married (second marriage) has one son in [place]    Worked in Set Designer   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    Completed 12th grade   1 dog, 1 cat   Was raised by her grandmother.    Social Drivers of Corporate Investment Banker Strain: Low Risk  (11/14/2023)   Overall Financial Resource Strain (CARDIA)    Difficulty of Paying Living Expenses: Not hard at all  Food Insecurity: No Food Insecurity (11/14/2023)   Hunger Vital Sign    Worried About Running Out of Food in the Last Year: Never true    Ran Out of Food in the Last Year: Never true  Transportation Needs: No Transportation Needs (11/14/2023)   PRAPARE - Administrator, Civil Service (Medical): No    Lack of Transportation (Non-Medical): No  Physical Activity: Sufficiently Active (11/14/2023)   Exercise Vital Sign    Days of Exercise per Week: 5 days    Minutes of Exercise per Session: 30 min  Stress: No Stress Concern Present (11/14/2023)   [employer] of Occupational Health - Occupational Stress Questionnaire    Feeling of Stress: Not at all  Social Connections: Unknown (11/14/2023)   Social Connection and Isolation Panel    Frequency of Communication with Friends and Family: Patient declined    Frequency of Social Gatherings with Friends and Family: Patient declined    Attends Religious Services: Patient declined    Database Administrator or Organizations: No    Attends Engineer, Structural: Not on file    Marital Status: Married  Catering Manager Violence: Not on file    Outpatient Medications Prior to Visit  Medication Sig Dispense Refill   amitriptyline  (ELAVIL ) 25 MG tablet Take 1 tablet (25 mg total) by mouth at bedtime. 90 tablet 1   atorvastatin  (LIPITOR) 20 MG tablet Take 1 tablet by mouth once daily 90 tablet 0   Continuous Glucose Sensor (FREESTYLE LIBRE 3 PLUS SENSOR) MISC Change sensor every 15 days. 2 each EACH   escitalopram  (LEXAPRO ) 10 MG tablet Take 1.5 tablets  (15 mg total) by mouth daily. 135 tablet 0   insulin  lispro (HUMALOG ) 100 UNIT/ML KwikPen Inject prior to dinner using sliding scale 0-12 units based on blood sugar reading 15 mL 3   JANUVIA   100 MG tablet Take 1 tablet by mouth once daily 90 tablet 0   JARDIANCE  10 MG TABS tablet Take 1 tablet by mouth once daily 90 tablet 0   LANTUS  SOLOSTAR 100 UNIT/ML Solostar Pen INJECT 13 UNITS SUBCUTANEOUSLY AT BEDTIME 15 mL 0   levothyroxine  (SYNTHROID ) 75 MCG tablet Take 1 tablet (75 mcg total) by mouth daily before breakfast. 90 tablet 0   meclizine  (ANTIVERT ) 25 MG tablet Take 1 tablet (25 mg total) by mouth 3 (three) times daily as needed for dizziness. 30 tablet 0   pioglitazone  (ACTOS ) 30 MG tablet Take 1 tablet by mouth once daily 90 tablet 0   albuterol  (VENTOLIN  HFA) 108 (90 Base) MCG/ACT inhaler Inhale 2 puffs into the lungs every 6 (six) hours as needed for wheezing or shortness of breath. 8 g 0   fluticasone -salmeterol (ADVAIR) 100-50 MCG/ACT AEPB Inhale 1 puff into the lungs 2 (two) times daily. 1 each 3   predniSONE  (DELTASONE ) 10 MG tablet 4 tabs by mouth once daily for 2 days, then 3 tabs daily x 2 days, then 2 tabs daily x 2 days, then 1 tab daily x 2 days 20 tablet 0   promethazine -dextromethorphan (PROMETHAZINE -DM) 6.25-15 MG/5ML syrup Take 5 mLs by mouth 4 (four) times daily as needed for cough. 118 mL 0   No facility-administered medications prior to visit.    Allergies  Allergen Reactions   Augmentin  [Amoxicillin -Pot Clavulanate] Diarrhea    diarrhea   Tramadol  Itching    Review of Systems  Respiratory:  Positive for cough.        Objective:    Physical Exam Constitutional:      General: She is not in acute distress.    Appearance: Normal appearance. She is well-developed.  HENT:     Head: Normocephalic and atraumatic.     Right Ear: External ear normal.     Left Ear: External ear normal.  Eyes:     General: No scleral icterus. Neck:     Thyroid : No thyromegaly.   Cardiovascular:     Rate and Rhythm: Normal rate and regular rhythm.     Heart sounds: Normal heart sounds. No murmur heard. Pulmonary:     Effort: Pulmonary effort is normal. No respiratory distress.     Breath sounds: Wheezing present. No rales.  Musculoskeletal:     Cervical back: Neck supple.  Skin:    General: Skin is warm and dry.  Neurological:     Mental Status: She is alert and oriented to person, place, and time.  Psychiatric:        Mood and Affect: Mood normal.        Behavior: Behavior normal.        Thought Content: Thought content normal.        Judgment: Judgment normal.    Diabetic Foot Exam - Simple   Simple Foot Form Diabetic Foot exam was performed with the following findings: Yes 11/15/2023  4:08 PM  Visual Inspection No deformities, no ulcerations, no other skin breakdown bilaterally: Yes Sensation Testing Intact to touch and monofilament testing bilaterally: Yes Pulse Check Posterior Tibialis and Dorsalis pulse intact bilaterally: Yes Comments       BP 113/72 (BP Location: Right Arm, Patient Position: Sitting, Cuff Size: Small)   Pulse 91   Temp 97.7 F (36.5 C) (Oral)   Resp 16   Ht 5' 3 (1.6 m)   Wt 132 lb (59.9 kg)   LMP 01/26/2001   SpO2 94%  BMI 23.38 kg/m  Wt Readings from Last 3 Encounters:  11/15/23 132 lb (59.9 kg)  08/09/23 132 lb (59.9 kg)  05/10/23 136 lb (61.7 kg)       Assessment & Plan:   Problem List Items Addressed This Visit       Unprioritized   Uncontrolled type 2 diabetes mellitus with hyperglycemia (HCC)   Lab Results  Component Value Date   HGBA1C 6.9 (H) 08/09/2023   HGBA1C 9.0 (H) 05/10/2023   HGBA1C 8.2 (H) 05/22/2022   Lab Results  Component Value Date   MICROALBUR 0.2 08/09/2023   LDLCALC 87 08/09/2023   CREATININE 0.83 08/09/2023   Better controlled now.  Some hypoglycemia overnight. Recommend decrease lantus  down to 10 units daily after completing prednisone  taper.       Hypothyroidism    Continues synthroid , update tsh.       Relevant Orders   TSH   Bronchitis with bronchospasm    Intermittent cough and wheezing for three months. Differential includes inflammatory causes versus infection. Awaiting chest x-ray results. - Ordered chest x-ray. - Prescribed prednisone . - Prescribed albuterol  inhaler, use every six hours until symptoms improve. - Will consider antibiotics if chest x-ray shows infection. - Prescribed cough suppressant-promethazine -DM.       Relevant Medications   predniSONE  (DELTASONE ) 10 MG tablet   albuterol  (VENTOLIN  HFA) 108 (90 Base) MCG/ACT inhaler   promethazine -dextromethorphan (PROMETHAZINE -DM) 6.25-15 MG/5ML syrup   Other Visit Diagnoses       Cough, unspecified type    -  Primary     Diabetes mellitus type 2 in nonobese (HCC)       Relevant Orders   DG Chest 2 View   HgB A1c   Urine Microalbumin w/creat. ratio   Basic Metabolic Panel (BMET)     Pain of right great toe       Relevant Orders   Ambulatory referral to Podiatry       I have discontinued Denya K. Parks's albuterol , fluticasone -salmeterol, promethazine -dextromethorphan, and predniSONE . I am also having her start on predniSONE , albuterol , and promethazine -dextromethorphan. Additionally, I am having her maintain her meclizine , insulin  lispro, Januvia , amitriptyline , FreeStyle Libre 3 Plus Sensor, atorvastatin , escitalopram , Jardiance , pioglitazone , levothyroxine , and Lantus  SoloStar.  Meds ordered this encounter  Medications   predniSONE  (DELTASONE ) 10 MG tablet    Sig: Take 4 tabs by mouth once daily for 2 days, then 3 tabs daily x 2 days, then 2 tabs daily x 2 days, then 1 tab daily x 2 days    Dispense:  20 tablet    Refill:  0    Supervising Provider:   DOMENICA BLACKBIRD A [[id number]]   albuterol  (VENTOLIN  HFA) 108 (90 Base) MCG/ACT inhaler    Sig: Inhale 2 puffs into the lungs every 6 (six) hours as needed for wheezing or shortness of breath.    Dispense:  6.7 g     Refill:  0    Supervising Provider:   DOMENICA BLACKBIRD A [[id number]]   promethazine -dextromethorphan (PROMETHAZINE -DM) 6.25-15 MG/5ML syrup    Sig: Take 5 mLs by mouth 4 (four) times daily as needed for cough.    Dispense:  118 mL    Refill:  0    Supervising Provider:   DOMENICA BLACKBIRD A [[id number]]

## 2023-11-15 NOTE — Assessment & Plan Note (Addendum)
 Lab Results  Component Value Date   HGBA1C 6.9 (H) 08/09/2023   HGBA1C 9.0 (H) 05/10/2023   HGBA1C 8.2 (H) 05/22/2022   Lab Results  Component Value Date   MICROALBUR 0.2 08/09/2023   LDLCALC 87 08/09/2023   CREATININE 0.83 08/09/2023   Better controlled now.  Some hypoglycemia overnight. Recommend decrease lantus  down to 10 units daily after completing prednisone  taper.

## 2023-11-15 NOTE — Patient Instructions (Signed)
 VISIT SUMMARY:  You visited us  today for a persistent cough that has lasted for three months. We also discussed your diabetes management and pain in your right big toe. Additionally, you received your flu shot.  YOUR PLAN:  RECURRENT COUGH AND WHEEZING: You have been experiencing a persistent cough and wheezing for three months, which worsened after a COVID-19 infection. -A chest x-ray has been ordered to investigate further. -You have been prescribed prednisone . -Use the albuterol  inhaler every six hours until your symptoms improve. -If the chest x-ray shows an infection, we will consider antibiotics. -You have been prescribed a cough suppressant.  TYPE 2 DIABETES MELLITUS: Your blood glucose levels are well-controlled, but you are experiencing low blood sugar at night. -Your A1c has been updated. -Adjust your bedtime insulin  dose to 10 units for one to two weeks. -Continue to monitor your blood glucose levels.  PAIN IN RIGHT BIG TOE: You have chronic pain in your right big toe, which may be related to arthritis or diabetes. -You have been referred to a podiatrist for further evaluation.  GENERAL HEALTH MAINTENANCE: You are due for your flu vaccination. -You received your flu shot today.

## 2023-11-15 NOTE — Assessment & Plan Note (Signed)
 Continues synthroid , update tsh.

## 2023-11-15 NOTE — Assessment & Plan Note (Signed)
  Intermittent cough and wheezing for three months. Differential includes inflammatory causes versus infection. Awaiting chest x-ray results. - Ordered chest x-ray. - Prescribed prednisone . - Prescribed albuterol  inhaler, use every six hours until symptoms improve. - Will consider antibiotics if chest x-ray shows infection. - Prescribed cough suppressant-promethazine -DM.

## 2023-11-16 ENCOUNTER — Other Ambulatory Visit: Payer: Self-pay | Admitting: Family

## 2023-11-16 DIAGNOSIS — E119 Type 2 diabetes mellitus without complications: Secondary | ICD-10-CM

## 2023-11-16 LAB — BASIC METABOLIC PANEL WITH GFR
BUN/Creatinine Ratio: 34 (calc) — ABNORMAL HIGH (ref 6–22)
BUN: 29 mg/dL — ABNORMAL HIGH (ref 7–25)
CO2: 27 mmol/L (ref 20–32)
Calcium: 8.7 mg/dL (ref 8.6–10.4)
Chloride: 102 mmol/L (ref 98–110)
Creat: 0.86 mg/dL (ref 0.50–1.05)
Glucose, Bld: 144 mg/dL — ABNORMAL HIGH (ref 65–99)
Potassium: 4.5 mmol/L (ref 3.5–5.3)
Sodium: 140 mmol/L (ref 135–146)
eGFR: 76 mL/min/1.73m2 (ref >=60)

## 2023-11-16 LAB — HEMOGLOBIN A1C
Hgb A1c MFr Bld: 6.6 % — ABNORMAL HIGH (ref <5.7)
Mean Plasma Glucose: 143 mg/dL
eAG (mmol/L): 7.9 mmol/L

## 2023-11-16 LAB — TSH: TSH: 7.71 m[IU]/L — ABNORMAL HIGH (ref 0.40–4.50)

## 2023-11-18 ENCOUNTER — Encounter: Payer: Self-pay | Admitting: Family

## 2023-11-18 ENCOUNTER — Telehealth: Payer: Self-pay | Admitting: Family

## 2023-11-18 ENCOUNTER — Other Ambulatory Visit: Payer: Self-pay

## 2023-11-18 DIAGNOSIS — E039 Hypothyroidism, unspecified: Secondary | ICD-10-CM

## 2023-11-18 MED ORDER — LEVOTHYROXINE SODIUM 100 MCG PO TABS: 100.0000 ug | ORAL_TABLET | Freq: Every day | ORAL | 0 refills | Status: DC

## 2023-11-18 NOTE — Telephone Encounter (Signed)
 Lab work shows synthroid  should be increased.  Please increase from 75 mcg to 100 mcg and repeat tsh in 6 weeks.  How is she feeling?

## 2023-11-18 NOTE — Telephone Encounter (Signed)
 Pt has been notified Lab visit has been made Pt reports some fatigue . She thought this was due to work.

## 2023-11-19 ENCOUNTER — Other Ambulatory Visit (HOSPITAL_COMMUNITY): Payer: Self-pay

## 2023-11-19 ENCOUNTER — Telehealth: Payer: Self-pay

## 2023-11-19 NOTE — Telephone Encounter (Signed)
 Pharmacy Patient Advocate Encounter   Received notification from Patient Advice Request messages that prior authorization for Januvia  100mg  tabs is required/requested.   Insurance verification completed.   The patient is insured through [employer].   Per test claim: PA required; PA submitted to above mentioned insurance via Latent Key/confirmation #/EOC [id number] Status is pending

## 2023-11-19 NOTE — Telephone Encounter (Signed)
 Pharmacy Patient Advocate Encounter  Received notification from [employer] that Prior Authorization for Januvia  100mg  tabs has been APPROVED from 11/19/23 to 11/18/24. Ran test claim, Copay is $150 for 90 days. This test claim was processed through [facility]- copay amounts may vary at other pharmacies due to pharmacy/plan contracts, or as the patient moves through the different stages of their insurance plan.   PA #/Case ID/Reference #: [id number]  Left a message at [facility] pharmacy to notify of the approval. Per the test claim, states move to mail order for reduced copays on 90 day supply

## 2023-11-20 ENCOUNTER — Ambulatory Visit: Admitting: Podiatry

## 2023-11-20 ENCOUNTER — Encounter: Payer: Self-pay | Admitting: Podiatry

## 2023-11-20 ENCOUNTER — Ambulatory Visit (INDEPENDENT_AMBULATORY_CARE_PROVIDER_SITE_OTHER)

## 2023-11-20 VITALS — Ht 63.0 in | Wt 132.0 lb

## 2023-11-20 DIAGNOSIS — M7751 Other enthesopathy of right foot: Secondary | ICD-10-CM

## 2023-11-20 DIAGNOSIS — G5791 Unspecified mononeuropathy of right lower limb: Secondary | ICD-10-CM

## 2023-11-20 NOTE — Progress Notes (Signed)
   No chief complaint on file.   HPI: 63 y.o. female presenting today as a new patient for evaluation of right great toe pins and needle sensations.  Patient states that for several months now she has had irritation to the great toe and it feels occasionally like 'bee stings'.  Presenting for further treatment and evaluation  Past Medical History:  Diagnosis Date   Allergy    Anemia    Anxiety    Aortic atherosclerosis    Arthritis    Depression    Diabetes type 2, controlled (HCC) 06/21/2016   Diverticulosis 2015   CT SCAN    GERD (gastroesophageal reflux disease)    Graves disease 1997   Hearing loss    Hepatic steatosis    History of chicken pox    History of migraine    Hyperlipidemia    Hypertriglyceridemia 06/21/2016   Internal hemorrhoids    Kidney stones    Osteopenia 07/08/2016   Osteoporosis    osteopenia   Tubular adenoma of colon     Past Surgical History:  Procedure Laterality Date   APPENDECTOMY  2001   COLONOSCOPY     ELBOW SURGERY Right 2016   ENDOMETRIAL ABLATION  2003   HIP FRACTURE SURGERY Right 2009   fell playing with dog   labrum repair Right 2011   NASAL SINUS SURGERY     1998   OOPHORECTOMY Right 2001   due to large ovarian cyst   ovarian cysts     removed x 4 before the oophorectomy   POLYPECTOMY     THYROIDECTOMY  1997    Allergies  Allergen Reactions   Augmentin  [Amoxicillin -Pot Clavulanate] Diarrhea    diarrhea   Tramadol  Itching     Physical Exam: General: The patient is alert and oriented x3 in no acute distress.  Dermatology: Skin is warm, dry and supple bilateral lower extremities.   Vascular: Palpable pedal pulses bilaterally. Capillary refill within normal limits.  No appreciable edema.  No erythema.  Neurological: Grossly intact via light touch  Musculoskeletal Exam: No pedal deformities noted  Radiographic Exam RT foot 11/20/2023:  Normal osseous mineralization. Joint spaces preserved.  There is some small  periarticular spurring noted around the IPJ medial aspect of the right great toe  Assessment/Plan of Care: 1.  Neuritis of the great toe right foot 2.  IPJ periarticular spurring right great toe  -Patient evaluated.  X-rays reviewed -Recommend wide fitting shoes that do not irritate or constrict the toebox area.  Reassured the patient that I do not believe that the sensations that she is experiencing is associated to diabetes and more so related to irritation or rubbing of her great toe joint in shoes.  She wears steel toed shoes at work -Silicone toe covers were dispensed to alleviate and reduce friction -Return to clinic PRN       Thresa EMERSON Sar, DPM [facility]  Dr. Thresa EMERSON Sar, DPM    [address] [place], [place] [address]                Office [phone]  Fax [phone]

## 2023-12-12 ENCOUNTER — Other Ambulatory Visit: Payer: Self-pay | Admitting: Family

## 2023-12-18 ENCOUNTER — Other Ambulatory Visit: Payer: Self-pay | Admitting: Family

## 2023-12-30 ENCOUNTER — Other Ambulatory Visit

## 2023-12-30 DIAGNOSIS — E039 Hypothyroidism, unspecified: Secondary | ICD-10-CM

## 2023-12-31 LAB — TSH: TSH: 0.94 u[IU]/mL (ref 0.35–5.50)

## 2024-01-01 ENCOUNTER — Ambulatory Visit: Payer: Self-pay | Admitting: Family

## 2024-01-13 ENCOUNTER — Other Ambulatory Visit (HOSPITAL_BASED_OUTPATIENT_CLINIC_OR_DEPARTMENT_OTHER): Payer: Self-pay

## 2024-01-13 ENCOUNTER — Ambulatory Visit (INDEPENDENT_AMBULATORY_CARE_PROVIDER_SITE_OTHER): Admitting: Family

## 2024-01-13 ENCOUNTER — Encounter: Payer: Self-pay | Admitting: Family

## 2024-01-13 VITALS — BP 124/72 | HR 92 | Temp 97.9°F | Resp 16 | Ht 62.0 in | Wt 134.4 lb

## 2024-01-13 DIAGNOSIS — J40 Bronchitis, not specified as acute or chronic: Secondary | ICD-10-CM

## 2024-01-13 DIAGNOSIS — R053 Chronic cough: Secondary | ICD-10-CM | POA: Diagnosis not present

## 2024-01-13 MED ORDER — PREDNISONE 20 MG PO TABS
20.0000 mg | ORAL_TABLET | Freq: Every day | ORAL | 0 refills | Status: AC
  Filled 2024-01-13: qty 5, 5d supply, fill #0

## 2024-01-13 MED ORDER — BUDESONIDE-FORMOTEROL FUMARATE 80-4.5 MCG/ACT IN AERO: 2.0000 | INHALATION_SPRAY | Freq: Two times a day (BID) | RESPIRATORY_TRACT | 3 refills | Status: DC

## 2024-01-13 MED ORDER — IPRATROPIUM-ALBUTEROL 0.5-2.5 (3) MG/3ML IN SOLN
3.0000 mL | Freq: Once | RESPIRATORY_TRACT | Status: AC
  Administered 2024-01-13: 3 mL via RESPIRATORY_TRACT

## 2024-01-13 MED ORDER — MONTELUKAST SODIUM 10 MG PO TABS: 10.0000 mg | ORAL_TABLET | Freq: Every day | ORAL | 3 refills | Status: DC

## 2024-01-13 MED ORDER — BUDESONIDE-FORMOTEROL FUMARATE 80-4.5 MCG/ACT IN AERO
2.0000 | INHALATION_SPRAY | Freq: Two times a day (BID) | RESPIRATORY_TRACT | 3 refills | Status: AC
  Filled 2024-01-13: qty 10.2, 30d supply, fill #0

## 2024-01-13 MED ORDER — MONTELUKAST SODIUM 10 MG PO TABS
10.0000 mg | ORAL_TABLET | Freq: Every day | ORAL | 3 refills | Status: AC
  Filled 2024-01-13: qty 30, 30d supply, fill #0
  Filled 2024-02-09: qty 30, 30d supply, fill #1

## 2024-01-13 NOTE — Progress Notes (Signed)
 "  Acute Office Visit  Subjective:     Patient ID: Donna Anderson, female    DOB: Jun 14, 1960, 63 y.o.   MRN: [id number]  Chief Complaint  Patient presents with   Cough    Patient is here for a persistent cough that has lasted for weeks    HPI Patient is a 63 year old female, former smoker with complaints of cough, congestion, wheezing.  Reports that she has had 3-4 episodes of this over the last year resulting in her having to take prednisone  and use albuterol  inhalers.  Her concern is that the symptomatology returned.  Denies any changes in her environment.  Is unsure of the trigger.  She denies any fever or chills.  Review of Systems  Constitutional:  Negative for chills and fever.  HENT:  Positive for congestion.   Respiratory:  Positive for cough and shortness of breath.   Cardiovascular: Negative.   Musculoskeletal: Negative.   Skin: Negative.   Neurological: Negative.   Endo/Heme/Allergies: Negative.   Psychiatric/Behavioral: Negative.      Past Medical History:  Diagnosis Date   Allergy    Anemia    Anxiety    Aortic atherosclerosis    Arthritis    Depression    Diabetes type 2, controlled (HCC) 06/21/2016   Diverticulosis 2015   CT SCAN    GERD (gastroesophageal reflux disease)    Graves disease 1997   Hearing loss    Hepatic steatosis    History of chicken pox    History of migraine    Hyperlipidemia    Hypertriglyceridemia 06/21/2016   Internal hemorrhoids    Kidney stones    Osteopenia 07/08/2016   Osteoporosis    osteopenia   Tubular adenoma of colon     Social History   Socioeconomic History   Marital status: Married    Spouse name: Not on file   Number of children: Not on file   Years of education: Not on file   Highest education level: 12th grade  Occupational History   Not on file  Tobacco Use   Smoking status: Former    Current packs/day: 0.00    Average packs/day: 0.3 packs/day for 15.0 years (3.8 ttl pk-yrs)    Types: Cigarettes     Start date: 01/16/1984    Quit date: 01/16/1999    Years since quitting: 25.0   Smokeless tobacco: Never  Vaping Use   Vaping status: Never Used  Substance and Sexual Activity   Alcohol use: Not Currently    Alcohol/week: 0.0 standard drinks of alcohol    Comment: none in 2 years as of 05/07/18   Drug use: No   Sexual activity: Yes    Birth control/protection: None  Other Topics Concern   Not on file  Social History Narrative   Married (second marriage) has one son in [place]   Worked in Set Designer   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    Completed 12th grade   1 dog, 1 cat   Was raised by her grandmother.    Social Drivers of Health   Tobacco Use: Medium Risk (01/13/2024)   Patient History    Smoking Tobacco Use: Former    Smokeless Tobacco Use: Never    Passive Exposure: Not on file  Financial Resource Strain: Low Risk (11/14/2023)   Overall Financial Resource Strain (CARDIA)    Difficulty of Paying Living Expenses: Not hard at all  Food Insecurity: No  Food Insecurity (11/14/2023)   Epic    Worried About Programme Researcher, Broadcasting/film/video in the Last Year: Never true    Ran Out of Food in the Last Year: Never true  Transportation Needs: No Transportation Needs (11/14/2023)   Epic    Lack of Transportation (Medical): No    Lack of Transportation (Non-Medical): No  Physical Activity: Sufficiently Active (11/14/2023)   Exercise Vital Sign    Days of Exercise per Week: 5 days    Minutes of Exercise per Session: 30 min  Stress: No Stress Concern Present (11/14/2023)   [employer] of Occupational Health - Occupational Stress Questionnaire    Feeling of Stress: Not at all  Social Connections: Unknown (11/14/2023)   Social Connection and Isolation Panel    Frequency of Communication with Friends and Family: Patient declined    Frequency of Social Gatherings with Friends and Family: Patient declined    Attends Religious Services:  Patient declined    Active Member of Clubs or Organizations: No    Attends Engineer, Structural: Not on file    Marital Status: Married  Catering Manager Violence: Not on file  Depression (PHQ2-9): Low Risk (01/13/2024)   Depression (PHQ2-9)    PHQ-2 Score: 0  Alcohol Screen: Not on file  Housing: High Risk (11/14/2023)   Epic    Unable to Pay for Housing in the Last Year: Yes    Number of Times Moved in the Last Year: Not on file    Homeless in the Last Year: No  Utilities: Not on file  Health Literacy: Not on file    Past Surgical History:  Procedure Laterality Date   APPENDECTOMY  2001   COLONOSCOPY     ELBOW SURGERY Right 2016   ENDOMETRIAL ABLATION  2003   HIP FRACTURE SURGERY Right 2009   fell playing with dog   labrum repair Right 2011   NASAL SINUS SURGERY     1998   OOPHORECTOMY Right 2001   due to large ovarian cyst   ovarian cysts     removed x 4 before the oophorectomy   POLYPECTOMY     THYROIDECTOMY  1997    Family History  Problem Relation Age of Onset   Hyperlipidemia Father    Hypertension Father    Heart failure Father 31   Diabetes Sister    Heart attack Sister 83   Osteoporosis Sister    Arthritis Paternal Grandmother    Kidney disease Paternal Grandmother    Diabetes Paternal Grandmother    Arthritis Paternal Grandfather    Hepatitis C Son    Arthritis Son    Drug abuse Son    Colon cancer Neg Hx    Colon polyps Neg Hx    Esophageal cancer Neg Hx    Rectal cancer Neg Hx    Stomach cancer Neg Hx    Liver disease Neg Hx    Inflammatory bowel disease Neg Hx     Allergies[1]  Medications Ordered Prior to Encounter[2]  BP 124/72 (BP Location: Right Arm, Patient Position: Sitting, Cuff Size: Normal)   Pulse 92   Temp 97.9 F (36.6 C) (Oral)   Resp 16   Ht 5' 2 (1.575 m)   Wt 134 lb 6.4 oz (61 kg)   LMP 01/26/2001   SpO2 98%   BMI 24.58 kg/m chart     Objective:    BP 124/72 (BP Location: Right Arm, Patient  Position: Sitting, Cuff Size: Normal)   Pulse  92   Temp 97.9 F (36.6 C) (Oral)   Resp 16   Ht 5' 2 (1.575 m)   Wt 134 lb 6.4 oz (61 kg)   LMP 01/26/2001   SpO2 98%   BMI 24.58 kg/m    Physical Exam Vitals and nursing note reviewed.  Constitutional:      Appearance: Normal appearance. She is normal weight.  HENT:     Right Ear: Tympanic membrane, ear canal and external ear normal.     Left Ear: Tympanic membrane, ear canal and external ear normal.     Nose: Nose normal.     Mouth/Throat:     Mouth: Mucous membranes are moist.     Pharynx: No oropharyngeal exudate or posterior oropharyngeal erythema.  Cardiovascular:     Rate and Rhythm: Regular rhythm. Tachycardia present.  Pulmonary:     Effort: Pulmonary effort is normal.     Breath sounds: Wheezing present. No rhonchi.  Musculoskeletal:        General: Normal range of motion.     Cervical back: Normal range of motion and neck supple.  Skin:    General: Skin is warm and dry.  Neurological:     General: No focal deficit present.     Mental Status: She is alert and oriented to person, place, and time. Mental status is at baseline.  Psychiatric:        Mood and Affect: Mood normal.        Behavior: Behavior normal.        Thought Content: Thought content normal.    No results found for any visits on 01/13/24.      Assessment & Plan:   Problem List Items Addressed This Visit   None Visit Diagnoses       Chronic cough    -  Primary   Relevant Medications   ipratropium-albuterol  (DUONEB) 0.5-2.5 (3) MG/3ML nebulizer solution 3 mL (Start on 01/13/2024  3:30 PM)     Bronchitis           Meds ordered this encounter  Medications   ipratropium-albuterol  (DUONEB) 0.5-2.5 (3) MG/3ML nebulizer solution 3 mL   montelukast  (SINGULAIR ) 10 MG tablet    Sig: Take 1 tablet (10 mg total) by mouth at bedtime.    Dispense:  30 tablet    Refill:  3   budesonide -formoterol  (SYMBICORT ) 80-4.5 MCG/ACT inhaler    Sig:  Inhale 2 puffs into the lungs 2 (two) times daily.    Dispense:  1 each    Refill:  3  Prednisone  20 mg daily x 5 days.   Call the office with any questions or concerns.  Start Singulair  10 mg once daily at bedtime.  Symbicort  2 puffs twice a day.  Refer to allergist to see if we can determine the underlying cause of the reactive airway.  Chest x-ray in October was normal. No follow-ups on file.  Oluwatomisin Deman B Sevyn Paredez, FNP      [1]  Allergies Allergen Reactions   Augmentin  [Amoxicillin -Pot Clavulanate] Diarrhea    diarrhea   Tramadol  Itching  [2]  Current Outpatient Medications on File Prior to Visit  Medication Sig Dispense Refill   albuterol  (VENTOLIN  HFA) 108 (90 Base) MCG/ACT inhaler Inhale 2 puffs into the lungs every 6 (six) hours as needed for wheezing or shortness of breath. 6.7 g 0   amitriptyline  (ELAVIL ) 25 MG tablet Take 1 tablet (25 mg total) by mouth at bedtime. 90 tablet 1   atorvastatin  (LIPITOR) 20  MG tablet Take 1 tablet by mouth once daily 90 tablet 1   Continuous Glucose Sensor (FREESTYLE LIBRE 3 PLUS SENSOR) MISC Change sensor every 15 days. 2 each EACH   escitalopram  (LEXAPRO ) 10 MG tablet TAKE 1 & 1/2 (ONE & ONE-HALF) TABLETS BY MOUTH ONCE DAILY 135 tablet 0   insulin  lispro (HUMALOG ) 100 UNIT/ML KwikPen Inject prior to dinner using sliding scale 0-12 units based on blood sugar reading 15 mL 3   JARDIANCE  10 MG TABS tablet Take 1 tablet by mouth once daily 90 tablet 0   LANTUS  SOLOSTAR 100 UNIT/ML Solostar Pen INJECT 13 UNITS SUBCUTANEOUSLY AT BEDTIME 15 mL 0   levothyroxine  (SYNTHROID ) 100 MCG tablet Take 1 tablet (100 mcg total) by mouth daily. 90 tablet 0   meclizine  (ANTIVERT ) 25 MG tablet Take 1 tablet (25 mg total) by mouth 3 (three) times daily as needed for dizziness. 30 tablet 0   pioglitazone  (ACTOS ) 30 MG tablet Take 1 tablet by mouth once daily 90 tablet 0   predniSONE  (DELTASONE ) 10 MG tablet Take 4 tabs by mouth once daily for 2 days, then 3 tabs daily  x 2 days, then 2 tabs daily x 2 days, then 1 tab daily x 2 days 20 tablet 0   promethazine -dextromethorphan (PROMETHAZINE -DM) 6.25-15 MG/5ML syrup Take 5 mLs by mouth 4 (four) times daily as needed for cough. 118 mL 0   sitaGLIPtin  (JANUVIA ) 100 MG tablet Take 1 tablet (100 mg total) by mouth daily. 90 tablet 0   No current facility-administered medications on file prior to visit.   "

## 2024-01-13 NOTE — Telephone Encounter (Signed)
 Patient was scheduled to come in at 3 pm to see Kenney Roys NP

## 2024-02-04 ENCOUNTER — Other Ambulatory Visit: Payer: Self-pay | Admitting: Family

## 2024-02-10 ENCOUNTER — Other Ambulatory Visit: Payer: Self-pay | Admitting: Family

## 2024-02-10 DIAGNOSIS — E039 Hypothyroidism, unspecified: Secondary | ICD-10-CM

## 2024-02-10 NOTE — Telephone Encounter (Signed)
 Please contact pt to schedule follow up some time in February.

## 2024-02-12 NOTE — Telephone Encounter (Signed)
 "  Got pt scheduled  "

## 2024-02-14 ENCOUNTER — Other Ambulatory Visit: Payer: Self-pay | Admitting: Family

## 2024-02-14 DIAGNOSIS — E119 Type 2 diabetes mellitus without complications: Secondary | ICD-10-CM

## 2024-02-17 ENCOUNTER — Encounter: Payer: Self-pay | Admitting: Allergy & Immunology

## 2024-02-18 ENCOUNTER — Ambulatory Visit: Admitting: Allergy & Immunology

## 2024-03-18 ENCOUNTER — Ambulatory Visit: Admitting: Family
